# Patient Record
Sex: Female | Born: 1945
Health system: Southern US, Community
[De-identification: ages and names within clinical notes are randomized; demographics above are authoritative.]

## PROBLEM LIST (undated history)

## (undated) DIAGNOSIS — K760 Fatty (change of) liver, not elsewhere classified: Secondary | ICD-10-CM

## (undated) DIAGNOSIS — I10 Essential (primary) hypertension: Secondary | ICD-10-CM

## (undated) DIAGNOSIS — T7840XA Allergy, unspecified, initial encounter: Secondary | ICD-10-CM

## (undated) DIAGNOSIS — E785 Hyperlipidemia, unspecified: Secondary | ICD-10-CM

## (undated) DIAGNOSIS — K7460 Unspecified cirrhosis of liver: Secondary | ICD-10-CM

## (undated) DIAGNOSIS — K449 Diaphragmatic hernia without obstruction or gangrene: Secondary | ICD-10-CM

## (undated) DIAGNOSIS — J45909 Unspecified asthma, uncomplicated: Secondary | ICD-10-CM

## (undated) DIAGNOSIS — D471 Chronic myeloproliferative disease: Secondary | ICD-10-CM

## (undated) DIAGNOSIS — I480 Paroxysmal atrial fibrillation: Secondary | ICD-10-CM

## (undated) DIAGNOSIS — D696 Thrombocytopenia, unspecified: Secondary | ICD-10-CM

## (undated) DIAGNOSIS — I483 Typical atrial flutter: Secondary | ICD-10-CM

## (undated) DIAGNOSIS — I4891 Unspecified atrial fibrillation: Secondary | ICD-10-CM

## (undated) DIAGNOSIS — Z9289 Personal history of other medical treatment: Secondary | ICD-10-CM

## (undated) DIAGNOSIS — R011 Cardiac murmur, unspecified: Secondary | ICD-10-CM

## (undated) DIAGNOSIS — K219 Gastro-esophageal reflux disease without esophagitis: Secondary | ICD-10-CM

## (undated) DIAGNOSIS — N390 Urinary tract infection, site not specified: Secondary | ICD-10-CM

## (undated) DIAGNOSIS — M1990 Unspecified osteoarthritis, unspecified site: Secondary | ICD-10-CM

## (undated) DIAGNOSIS — E7800 Pure hypercholesterolemia, unspecified: Secondary | ICD-10-CM

## (undated) DIAGNOSIS — K2210 Ulcer of esophagus without bleeding: Secondary | ICD-10-CM

## (undated) DIAGNOSIS — Z86010 Personal history of colonic polyps: Secondary | ICD-10-CM

## (undated) HISTORY — DX: Personal history of other medical treatment: Z92.89

## (undated) HISTORY — DX: Hyperlipidemia, unspecified: E78.5

## (undated) HISTORY — PX: APPENDECTOMY: SHX54

## (undated) HISTORY — DX: Unspecified atrial fibrillation: I48.91

## (undated) HISTORY — PX: ABDOMINAL HYSTERECTOMY: SHX81

## (undated) HISTORY — PX: ESOPHAGOGASTRODUODENOSCOPY: SHX1529

## (undated) HISTORY — DX: Fatty (change of) liver, not elsewhere classified: K76.0

## (undated) HISTORY — PX: TONSILLECTOMY: SUR1361

## (undated) HISTORY — PX: UPPER GASTROINTESTINAL ENDOSCOPY: SHX188

## (undated) HISTORY — PX: TOTAL KNEE ARTHROPLASTY: SHX125

## (undated) HISTORY — DX: Diaphragmatic hernia without obstruction or gangrene: K44.9

## (undated) HISTORY — DX: Allergy, unspecified, initial encounter: T78.40XA

## (undated) HISTORY — DX: Thrombocytopenia, unspecified: D69.6

## (undated) HISTORY — DX: Paroxysmal atrial fibrillation: I48.0

## (undated) HISTORY — PX: COLONOSCOPY: SHX174

## (undated) HISTORY — DX: Essential (primary) hypertension: I10

## (undated) HISTORY — DX: Cardiac murmur, unspecified: R01.1

## (undated) HISTORY — DX: Personal history of colonic polyps: Z86.010

## (undated) HISTORY — PX: GYNECOLOGIC CRYOSURGERY: SHX857

## (undated) HISTORY — DX: Unspecified osteoarthritis, unspecified site: M19.90

## (undated) HISTORY — DX: Urinary tract infection, site not specified: N39.0

## (undated) HISTORY — DX: Chronic myeloproliferative disease: D47.1

## (undated) HISTORY — PX: TUBAL LIGATION: SHX77

## (undated) HISTORY — DX: Unspecified asthma, uncomplicated: J45.909

## (undated) HISTORY — DX: Typical atrial flutter: I48.3

## (undated) HISTORY — DX: Unspecified cirrhosis of liver: K74.60

## (undated) HISTORY — DX: Pure hypercholesterolemia, unspecified: E78.00

## (undated) HISTORY — DX: Gastro-esophageal reflux disease without esophagitis: K21.9

## (undated) HISTORY — DX: Ulcer of esophagus without bleeding: K22.10

---

## 1993-03-23 DIAGNOSIS — K2210 Ulcer of esophagus without bleeding: Secondary | ICD-10-CM

## 1993-03-23 HISTORY — DX: Ulcer of esophagus without bleeding: K22.10

## 2014-03-23 HISTORY — PX: REPLACEMENT TOTAL KNEE: SUR1224

## 2015-11-29 ENCOUNTER — Telehealth: Payer: Self-pay | Admitting: Internal Medicine

## 2015-11-29 NOTE — Telephone Encounter (Signed)
Called Alilah back and left a message that we have Dr Algis Greenhouse notes but no Dr Patricia Pesa notes, will have her sign a ROI at office visit.

## 2015-12-02 ENCOUNTER — Other Ambulatory Visit (INDEPENDENT_AMBULATORY_CARE_PROVIDER_SITE_OTHER): Payer: Medicare Other

## 2015-12-02 ENCOUNTER — Encounter: Payer: Self-pay | Admitting: Internal Medicine

## 2015-12-02 ENCOUNTER — Encounter (INDEPENDENT_AMBULATORY_CARE_PROVIDER_SITE_OTHER): Payer: Self-pay

## 2015-12-02 ENCOUNTER — Ambulatory Visit (INDEPENDENT_AMBULATORY_CARE_PROVIDER_SITE_OTHER): Payer: Medicare Other | Admitting: Internal Medicine

## 2015-12-02 VITALS — BP 130/72 | HR 72 | Ht 65.0 in | Wt 201.6 lb

## 2015-12-02 DIAGNOSIS — R161 Splenomegaly, not elsewhere classified: Secondary | ICD-10-CM

## 2015-12-02 DIAGNOSIS — R748 Abnormal levels of other serum enzymes: Secondary | ICD-10-CM

## 2015-12-02 DIAGNOSIS — R740 Nonspecific elevation of levels of transaminase and lactic acid dehydrogenase [LDH]: Secondary | ICD-10-CM

## 2015-12-02 DIAGNOSIS — D696 Thrombocytopenia, unspecified: Secondary | ICD-10-CM

## 2015-12-02 DIAGNOSIS — K219 Gastro-esophageal reflux disease without esophagitis: Secondary | ICD-10-CM

## 2015-12-02 DIAGNOSIS — K760 Fatty (change of) liver, not elsewhere classified: Secondary | ICD-10-CM | POA: Diagnosis not present

## 2015-12-02 DIAGNOSIS — K449 Diaphragmatic hernia without obstruction or gangrene: Secondary | ICD-10-CM

## 2015-12-02 LAB — FERRITIN: Ferritin: 42.1 ng/mL (ref 10.0–291.0)

## 2015-12-02 LAB — CBC WITH DIFFERENTIAL/PLATELET
BASOS PCT: 0.3 % (ref 0.0–3.0)
Basophils Absolute: 0 10*3/uL (ref 0.0–0.1)
EOS ABS: 0.1 10*3/uL (ref 0.0–0.7)
EOS PCT: 1.5 % (ref 0.0–5.0)
HCT: 40.8 % (ref 36.0–46.0)
Hemoglobin: 14.4 g/dL (ref 12.0–15.0)
Lymphocytes Relative: 11.8 % — ABNORMAL LOW (ref 12.0–46.0)
Lymphs Abs: 0.9 10*3/uL (ref 0.7–4.0)
MCHC: 35.1 g/dL (ref 30.0–36.0)
MCV: 87.4 fl (ref 78.0–100.0)
MONO ABS: 0.2 10*3/uL (ref 0.1–1.0)
Monocytes Relative: 2.5 % — ABNORMAL LOW (ref 3.0–12.0)
NEUTROS ABS: 6.7 10*3/uL (ref 1.4–7.7)
Neutrophils Relative %: 83.9 % — ABNORMAL HIGH (ref 43.0–77.0)
PLATELETS: 73 10*3/uL — AB (ref 150.0–400.0)
RBC: 4.67 Mil/uL (ref 3.87–5.11)
RDW: 13.8 % (ref 11.5–15.5)
WBC: 8 10*3/uL (ref 4.0–10.5)

## 2015-12-02 LAB — COMPREHENSIVE METABOLIC PANEL
ALT: 22 U/L (ref 0–35)
AST: 38 U/L — AB (ref 0–37)
Albumin: 4.1 g/dL (ref 3.5–5.2)
Alkaline Phosphatase: 82 U/L (ref 39–117)
BUN: 11 mg/dL (ref 6–23)
CALCIUM: 9.3 mg/dL (ref 8.4–10.5)
CHLORIDE: 101 meq/L (ref 96–112)
CO2: 28 meq/L (ref 19–32)
CREATININE: 0.39 mg/dL — AB (ref 0.40–1.20)
GFR: 173.81 mL/min (ref >60.00)
Glucose, Bld: 82 mg/dL (ref 70–99)
POTASSIUM: 3.8 meq/L (ref 3.5–5.1)
SODIUM: 139 meq/L (ref 135–145)
Total Bilirubin: 1.1 mg/dL (ref 0.2–1.2)
Total Protein: 7.8 g/dL (ref 6.0–8.3)

## 2015-12-02 LAB — HEPATITIS A ANTIBODY, TOTAL: HEP A TOTAL AB: NONREACTIVE

## 2015-12-02 LAB — HEPATITIS B SURFACE ANTIGEN: Hepatitis B Surface Ag: NEGATIVE

## 2015-12-02 LAB — HEPATITIS B SURFACE ANTIBODY,QUALITATIVE: HEP B S AB: POSITIVE — AB

## 2015-12-02 LAB — PROTIME-INR
INR: 1.2 ratio — AB (ref 0.8–1.0)
PROTHROMBIN TIME: 13.1 s (ref 9.6–13.1)

## 2015-12-02 LAB — HEPATITIS C ANTIBODY: HCV Ab: NEGATIVE

## 2015-12-02 NOTE — Progress Notes (Signed)
Savannah Hobbs 70 y.o. 06-Jun-1945 [id number]  Assessment & Plan:   Encounter Diagnoses  Name Primary?  . Fatty liver Yes  . Abnormal transaminases   . Splenomegaly   . Thrombocytopenia ([place])   . Gastroesophageal reflux disease without esophagitis   . Hiatal hernia      CBC, CMET, PT/INR, Ferritin, ANA, Hep C Ab, Hep B surface Antigen, HAV total Ab  Repeat US  I am not sure she truly has cirrhosis. Certainly the splenomegaly and thrombocytopenia raises that possibility. She had a normal size liver with up appropriate hepatopedal flow in October 2016. She could need hepatic elastograophyOr possibly a biopsy that would try the former first.  May need a screening EGD if cirrhosis  Could need another hematology opinion. Her habits the thrombocytopenia is related to hematologic disorder. She did have a negative platelet autoantibody panel but I don't think that would telescope whole story if she had an autoimmune process.  Stay on PPI for GERD. I think some of the symptoms could've been from her large hiatal hernia as well.  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 She had thrombocytopenia discovered last year before knee surgery and was evaluated by hematologist with negative antiplatelet antibody testing. He referred her to a gastroenterologist, Dr. Earley Brooke - ultrasound had showed fatty liver changes and splenomegaly. There was normal portal vein flow. A liver spleen scan was performed which demonstrated shift towards the spleen. She says Dr. Earley Brooke told her she had cirrhosis and to see her in 6 months but not much else.   she had a platelet transfusion prior to her left knee replacement last year and did well with that without bleeding. She has never been a significant  drinker,He reports maybe occasional wine at 2 or 3 times or perhaps for times a week.  He has also seen Dr. Windy Fast for gastroesophageal reflux disease and EGD in 2009 demonstrated a very large hiatal hernia with negative esophageal biopsies, and a screening colonoscopy in 2009 was normal. She also had an EGD in 1992. She had some erosive esophagitis then. I reviewed all those reports independently.    a couple of weeks ago she had some episodes of chest tightness, she saw her cardiologist and EKG and labs were okay she went to twice a day a PPI for a while and that has resolved. She is under significant stress as her 69 year old son died in his sleep recently as well.   GI review of systems is otherwise negative  Allergies  Allergen Reactions  . Iodine Hives  . Shellfish Allergy Hives    Current Outpatient Prescriptions:  .  Ascorbic Acid (VITAMIN C) 1000 MG tablet, Take 1,000 mg by mouth daily., Disp: , Rfl:  .  Cholecalciferol (VITAMIN D3) 2000 units TABS, Take 1 tablet by mouth daily., Disp: , Rfl:  .  docusate sodium (COLACE) 100 MG capsule, Take 100 mg by mouth 2 (two) times daily., Disp: , Rfl:  .  hydrochlorothiazide (HYDRODIURIL) 25 MG tablet, Take 1 tablet by mouth as needed., Disp: , Rfl: 3 .  lansoprazole (PREVACID) 30 MG capsule, Take 15 mg by mouth daily., Disp: , Rfl:  .  Omega-3 Fatty Acids (FISH OIL) 1200 MG CAPS, Take 1 capsule by mouth daily., Disp: , Rfl:  .  simvastatin (  ZOCOR) 20 MG tablet, Take 40 mg by mouth daily., Disp: , Rfl:   Past Medical History:  Diagnosis Date  . Asthma   . Elevated cholesterol   . Erosive esophagitis 1995  . GERD (gastroesophageal reflux disease)   . Hiatal hernia    LARGE  . UTI (lower urinary tract infection)    Past Surgical History:  Procedure Laterality Date  . ABDOMINAL HYSTERECTOMY    . APPENDECTOMY    . COLONOSCOPY    . ESOPHAGOGASTRODUODENOSCOPY    . REPLACEMENT TOTAL KNEE Left 2016  . UPPER GASTROINTESTINAL  ENDOSCOPY     Social History   Social History  . Marital status: Married    Spouse name: N/A  . Number of children: N/A  . Years of education: N/A   Social History Main Topics  . Smoking status: Never Smoker  . Smokeless tobacco: Never Used  . Alcohol use No  . Drug use: No  . Sexual activity: Not Asked   Other Topics Concern  . 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   3 sons, 2015/10/25 1 son died in his sleep unclear cause   Updated 12/02/2015      Family History  Problem Relation Age of Onset  . Esophageal cancer Father    Review of Systems positive for some myalgia symptoms pedal edema especially on the left lower extremity allergy symptoms joint pains. All other review of systems are negative.   Objective:   Physical Exam @BP  130/72 (BP Location: Left Arm, Patient Position: Sitting, Cuff Size: Normal)   Pulse 72   Ht 5\' 5"  (1.651 m)   Wt 201 lb 9.6 oz (91.4 kg)   BMI 33.55 kg/m @  General:  Well-developed, well-nourished and in no acute distress Eyes:  anicteric. ENT:   Mouth and posterior pharynx free of lesions.  Neck:   supple w/o thyromegaly or mass.  Lungs: Clear to auscultation bilaterally. Heart:  S1S2, no rubs, murmurs, gallops. Abdomen:  obese soft, non-tender, no hepatosplenomegaly, hernia, or mass and BS+.  Lymph:  no cervical or supraclavicular adenopathy. Extremities:   no edema, cyanosis or clubbing Skin   no rash. No stigmata CLD Neuro:  A&O x 3.  Psych:  appropriate mood and  Affect.   Data Reviewed: As per history of present illness for ultrasound liver spleen scan. Endoscopic reports. These will be scanned. Platelet count was 62,000 in July 17 with an otherwise normal CBC. Total bilirubin 1.1 and AST 44 then. Other LFTs normal.

## 2015-12-02 NOTE — Patient Instructions (Signed)
Your physician has requested that you go to the basement for the  lab work before leaving today.   Today we are giving you an order to take with you to [facility] to have your complete abdominal ultrasound.  Have them fax the results to : [phone].      Follow up with Dr Carlean Purl in 3 months.       I appreciate the opportunity to care for you. Silvano Rusk, MD, [facility]

## 2015-12-03 LAB — ANA: ANA: NEGATIVE

## 2015-12-06 NOTE — Progress Notes (Signed)
Platelets remain low All other labs ok - no infectious hepatitis changes, autoimmune screen w/ ANA negative and not iron overloaded Await ultrasound which she is doing in [place]

## 2015-12-11 ENCOUNTER — Encounter: Payer: Self-pay | Admitting: Internal Medicine

## 2015-12-20 ENCOUNTER — Telehealth: Payer: Self-pay

## 2015-12-20 NOTE — Telephone Encounter (Signed)
Left message for patient to call back  

## 2015-12-24 NOTE — Telephone Encounter (Signed)
Patient notified of the results and recommendations on [place] report.  She wants to wait until Dec 11 appt to make a decision.

## 2016-03-02 ENCOUNTER — Ambulatory Visit: Payer: Medicare Other | Admitting: Internal Medicine

## 2016-04-17 ENCOUNTER — Encounter: Payer: Self-pay | Admitting: Internal Medicine

## 2016-04-17 ENCOUNTER — Ambulatory Visit (INDEPENDENT_AMBULATORY_CARE_PROVIDER_SITE_OTHER): Payer: Medicare Other | Admitting: Internal Medicine

## 2016-04-17 VITALS — BP 130/72 | HR 78 | Ht 64.0 in | Wt 212.0 lb

## 2016-04-17 DIAGNOSIS — D696 Thrombocytopenia, unspecified: Secondary | ICD-10-CM | POA: Diagnosis not present

## 2016-04-17 DIAGNOSIS — R161 Splenomegaly, not elsewhere classified: Secondary | ICD-10-CM | POA: Diagnosis not present

## 2016-04-17 DIAGNOSIS — K760 Fatty (change of) liver, not elsewhere classified: Secondary | ICD-10-CM | POA: Diagnosis not present

## 2016-04-17 NOTE — Patient Instructions (Signed)
  You have been scheduled for an abdominal ultrasound at [facility] Radiology (1st floor of hospital) on 04/22/16 at 11:00AM. Please arrive 15 minutes prior to your appointment for registration. Make certain not to have anything to eat or drink 6 hours prior to your appointment. Should you need to reschedule your appointment, please contact radiology at [phone]. This test typically takes about 30 minutes to perform.    I appreciate the opportunity to care for you. Silvano Rusk, MD, [facility]

## 2016-04-17 NOTE — Progress Notes (Signed)
   Savannah Hobbs 71 y.o. Sep 01, 1945 [id number]  Assessment & Plan:   Encounter Diagnoses  Name Primary?  . Fatty liver disease, nonalcoholic Yes  . Thrombocytopenia ([place])   . Splenomegaly    ? If she has cirrhosis -seems possible but not clear Will have her do hepatic elastography   Subjective:   Chief Complaint: fatty liver, ? cirrhosis  HPI Here for f/u w/ husband - has thrombocytopenia and fatty liver and splenomegaly. Liver/spleen scan w/ flow to spleen> liver Liver NL on [place] excepy fatty Has second TKR coming up in [place] negative for infection, autoimmune, ferritin ok  Medications, allergies, past medical history, past surgical history, family history and social history are reviewed and updated in the EMR.  Review of Systems As above  Objective:   Physical Exam BP 130/72 (BP Location: Left Arm, Patient Position: Sitting, Cuff Size: Normal)   Pulse 78   Ht 5\' 4"  (1.626 m) Comment: height measured without shoes  Wt 212 lb (96.2 kg)   BMI 36.39 kg/m  NAD

## 2016-04-21 ENCOUNTER — Telehealth: Payer: Self-pay | Admitting: Internal Medicine

## 2016-04-21 MED ORDER — LANSOPRAZOLE 15 MG PO CPDR: 15.0000 mg | DELAYED_RELEASE_CAPSULE | Freq: Every day | ORAL | 3 refills | Status: DC

## 2016-04-21 NOTE — Telephone Encounter (Signed)
Patient seen yesterday, lansoprazole sent in as requested.

## 2016-04-22 ENCOUNTER — Encounter (HOSPITAL_COMMUNITY)
Admission: RE | Admit: 2016-04-22 | Discharge: 2016-04-22 | Disposition: A | Discharge status: Home / Self Care | Payer: Medicare Other | Source: Ambulatory Visit | Attending: Internal Medicine | Admitting: Internal Medicine

## 2016-04-22 ENCOUNTER — Telehealth: Payer: Self-pay

## 2016-04-22 DIAGNOSIS — K760 Fatty (change of) liver, not elsewhere classified: Secondary | ICD-10-CM | POA: Insufficient documentation

## 2016-04-22 MED ORDER — PANTOPRAZOLE SODIUM 20 MG PO TBEC: 20.0000 mg | DELAYED_RELEASE_TABLET | Freq: Every day | ORAL | 3 refills | Status: DC

## 2016-04-22 NOTE — Telephone Encounter (Signed)
Pantoprazole 20 mg daily 1 year supply 30 vs 90

## 2016-04-22 NOTE — Telephone Encounter (Signed)
Sig please Sir, thank you. 

## 2016-04-22 NOTE — Telephone Encounter (Signed)
OK by me 

## 2016-04-22 NOTE — Telephone Encounter (Signed)
Pharmacy sent a request to change her lansoprazole to pantoprazole due to her insurance, please advise Sir, thank you.

## 2016-04-22 NOTE — Telephone Encounter (Signed)
  Sent in the pantoprazole to replace the lansoprazole because of insurance formulary.

## 2016-04-23 NOTE — Progress Notes (Signed)
This test is consistent with cirrhosis - good news is she does not seem to have any significant complications at this point. So I do not think further evaluation of low platelets is needed.  I do recommend an EGD at some point to look for esophageal varices - can do before or after her knee surgery that is upcoming  We [place] arrange other f/u after that  Fatty liver is cause

## 2016-06-03 ENCOUNTER — Telehealth: Payer: Self-pay | Admitting: Internal Medicine

## 2016-06-03 NOTE — Telephone Encounter (Signed)
Her surgeon needs clearance from you for knee surgery.  Ok to proceed?

## 2016-06-03 NOTE — Telephone Encounter (Signed)
pt states that she is suppose to have total knee replacment on 07/06/16 and needs Dr.Gessner's clearance due to her low platelets .

## 2016-06-03 NOTE — Telephone Encounter (Signed)
Patient notified of the recommendation. Her surgeon is Dr. Reynaldo Minium.  She asked that I fax this to [phone] to attention Tammy

## 2016-06-03 NOTE — Telephone Encounter (Signed)
I believe it is but would check a protime/INR and CBC before surgery  It would be up to him if he wants to give platelets pre-op or he could ask a hematologist  Probably safer to administer 10 U Platelets in advance to reduce bleeding risk

## 2018-04-13 ENCOUNTER — Encounter: Payer: Self-pay | Admitting: Internal Medicine

## 2018-04-18 ENCOUNTER — Ambulatory Visit (AMBULATORY_SURGERY_CENTER): Payer: Self-pay | Admitting: *Deleted

## 2018-04-18 VITALS — Ht 65.0 in | Wt 217.0 lb

## 2018-04-18 DIAGNOSIS — Z1211 Encounter for screening for malignant neoplasm of colon: Secondary | ICD-10-CM

## 2018-04-18 NOTE — Progress Notes (Signed)
No egg or soy allergy known to patient  No issues with past sedation with any surgeries  or procedures, no intubation problems  No diet pills per patient No home 02 use per patient  No blood thinners per patient  Pt denies issues with constipation - uses colace daily 2 x a day  No A fib or A flutter  EMMI video sent to pt's e mail - pt declined

## 2018-05-06 ENCOUNTER — Encounter: Payer: Medicare Other | Admitting: Internal Medicine

## 2018-05-24 ENCOUNTER — Encounter: Payer: Self-pay | Admitting: Internal Medicine

## 2018-05-24 ENCOUNTER — Other Ambulatory Visit: Payer: Self-pay

## 2018-05-24 ENCOUNTER — Ambulatory Visit (AMBULATORY_SURGERY_CENTER): Payer: Medicare Other | Admitting: Internal Medicine

## 2018-05-24 VITALS — BP 146/66 | HR 83 | Temp 98.6°F | Resp 15 | Ht 65.0 in | Wt 217.0 lb

## 2018-05-24 DIAGNOSIS — Z1211 Encounter for screening for malignant neoplasm of colon: Secondary | ICD-10-CM

## 2018-05-24 DIAGNOSIS — D125 Benign neoplasm of sigmoid colon: Secondary | ICD-10-CM | POA: Diagnosis not present

## 2018-05-24 DIAGNOSIS — D123 Benign neoplasm of transverse colon: Secondary | ICD-10-CM

## 2018-05-24 DIAGNOSIS — K635 Polyp of colon: Secondary | ICD-10-CM

## 2018-05-24 DIAGNOSIS — D120 Benign neoplasm of cecum: Secondary | ICD-10-CM

## 2018-05-24 DIAGNOSIS — D124 Benign neoplasm of descending colon: Secondary | ICD-10-CM

## 2018-05-24 MED ORDER — SODIUM CHLORIDE 0.9 % IV SOLN: 500.0000 mL | Freq: Once | INTRAVENOUS | Status: DC

## 2018-05-24 NOTE — Progress Notes (Signed)
Spontaneous respirations throughout. VSS. Resting comfortably. To PACU on room air. Report to  RN. 

## 2018-05-24 NOTE — Patient Instructions (Addendum)
I found and removed 6 small polyps. I will let you know pathology results and when to have another routine colonoscopy by mail and/or My Chart.  When we last had contact in 2018 I had intended for you to have an upper endoscopy but you were having knee surgery and were going to call back. Looks like that did not happen.  I suggest you make an appointment to see me so we can follow-up about the fatty liver disease.  I appreciate the opportunity to care for you. Gatha Mayer, MD, [facility]   Handouts Provided:  Polyps  YOU HAD AN ENDOSCOPIC PROCEDURE TODAY AT [place]:   Refer to the procedure report that was given to you for any specific questions about what was found during the examination.  If the procedure report does not answer your questions, please call your gastroenterologist to clarify.  If you requested that your care partner not be given the details of your procedure findings, then the procedure report has been included in a sealed envelope for you to review at your convenience later.  YOU SHOULD EXPECT: Some feelings of bloating in the abdomen. Passage of more gas than usual.  Walking can help get rid of the air that was put into your GI tract during the procedure and reduce the bloating. If you had a lower endoscopy (such as a colonoscopy or flexible sigmoidoscopy) you may notice spotting of blood in your stool or on the toilet paper. If you underwent a bowel prep for your procedure, you may not have a normal bowel movement for a few days.  Please Note:  You might notice some irritation and congestion in your nose or some drainage.  This is from the oxygen used during your procedure.  There is no need for concern and it should clear up in a day or so.  SYMPTOMS TO REPORT IMMEDIATELY:   Following lower endoscopy (colonoscopy or flexible sigmoidoscopy):  Excessive amounts of blood in the stool  Significant tenderness or worsening of abdominal  pains  Swelling of the abdomen that is new, acute  Fever of 100F or higher  For urgent or emergent issues, a gastroenterologist can be reached at any hour by calling [phone].   DIET:  We do recommend a small meal at first, but then you may proceed to your regular diet.  Drink plenty of fluids but you should avoid alcoholic beverages for 24 hours.  ACTIVITY:  You should plan to take it easy for the rest of today and you should NOT DRIVE or use heavy machinery until tomorrow (because of the sedation medicines used during the test).    FOLLOW UP: Our staff will call the number listed on your records the next business day following your procedure to check on you and address any questions or concerns that you may have regarding the information given to you following your procedure. If we do not reach you, we will leave a message.  However, if you are feeling well and you are not experiencing any problems, there is no need to return our call.  We will assume that you have returned to your regular daily activities without incident.  If any biopsies were taken you will be contacted by phone or by letter within the next 1-3 weeks.  Please call us at [phone] if you have not heard about the biopsies in 3 weeks.    SIGNATURES/CONFIDENTIALITY: You and/or your care partner have signed paperwork which will  be entered into your electronic medical record.  These signatures attest to the fact that that the information above on your After Visit Summary has been reviewed and is understood.  Full responsibility of the confidentiality of this discharge information lies with you and/or your care-partner.

## 2018-05-24 NOTE — Progress Notes (Signed)
Called to room to assist during endoscopic procedure.  Patient ID and intended procedure confirmed with present staff. Received instructions for my participation in the procedure from the performing physician.  

## 2018-05-25 ENCOUNTER — Telehealth: Payer: Self-pay

## 2018-05-25 NOTE — Telephone Encounter (Signed)
  Follow up Call-  Call back number 05/24/2018  Post procedure Call Back phone  # [phone]  Permission to leave phone message Yes  Some recent data might be hidden     Patient questions:  Do you have a fever, pain , or abdominal swelling? No. Pain Score  0 *  Have you tolerated food without any problems? Yes.    Have you been able to return to your normal activities? Yes.    Do you have any questions about your discharge instructions: Diet   No. Medications  No. Follow up visit  No.  Do you have questions or concerns about your Care? No.  Actions: * If pain score is 4 or above: No action needed, pain <4.   No problems noted per pt. maw

## 2018-05-25 NOTE — Op Note (Signed)
[facility] Patient Name: Savannah Hobbs Procedure Date: 05/24/2018 2:10 PM MRN: [id number] Endoscopist: Gatha Mayer , MD Age: 73 Referring MD:  Date of Birth: 04-May-1945 Gender: Female Account #: [id number] Procedure:                Colonoscopy Indications:              Screening for colorectal malignant neoplasm Medicines:                Propofol per Anesthesia, Monitored Anesthesia Care Procedure:                Pre-Anesthesia Assessment:                           - Prior to the procedure, a History and Physical                            was performed, and patient medications and                            allergies were reviewed. The patient's tolerance of                            previous anesthesia was also reviewed. The risks                            and benefits of the procedure and the sedation                            options and risks were discussed with the patient.                            All questions were answered, and informed consent                            was obtained. Prior Anticoagulants: The patient has                            taken no previous anticoagulant or antiplatelet                            agents. ASA Grade Assessment: III - A patient with                            severe systemic disease. After reviewing the risks                            and benefits, the patient was deemed in                            satisfactory condition to undergo the procedure.                           After obtaining informed consent, the colonoscope  was passed under direct vision. Throughout the                            procedure, the patient's blood pressure, pulse, and                            oxygen saturations were monitored continuously. The                            Colonoscope was introduced through the anus and                            advanced to the the cecum, identified by   appendiceal orifice and ileocecal valve. The                            colonoscopy was performed without difficulty. The                            patient tolerated the procedure well. The quality                            of the bowel preparation was good. The bowel                            preparation used was Miralax. The ileocecal valve,                            appendiceal orifice, and rectum were photographed. Scope In: 2:38:43 PM Scope Out: 3:01:21 PM Scope Withdrawal Time: 0 hours 18 minutes 30 seconds  Total Procedure Duration: 0 hours 22 minutes 38 seconds  Findings:                 The perianal and digital rectal examinations were                            normal.                           Six sessile polyps were found in the sigmoid colon,                            descending colon, transverse colon and cecum. The                            polyps were 2 to 6 mm in size. These polyps were                            removed with a cold snare. Resection and retrieval                            were complete. Verification of patient  identification for the specimen was done. Estimated                            blood loss was minimal.                           A patchy area of altered vascular mucosa was found                            in the transverse colon, in the ascending colon and                            in the cecum. Telangiectatic changes scattered                            about.                           The exam was otherwise without abnormality on                            direct and retroflexion views. Complications:            No immediate complications. Estimated Blood Loss:     Estimated blood loss was minimal. Impression:               - Six 2 to 6 mm polyps in the sigmoid colon, in the                            descending colon, in the transverse colon and in                            the cecum, removed with a cold  snare. Resected and                            retrieved.                           - Altered vascular mucosa in the transverse colon,                            in the ascending colon and in the cecum.                            Telengiectatic changes. ? if this could be portal                            colopathy.                           - The examination was otherwise normal on direct                            and retroflexion views. Recommendation:           - Patient has a  contact number available for                            emergencies. The signs and symptoms of potential                            delayed complications were discussed with the                            patient. Return to normal activities tomorrow.                            Written discharge instructions were provided to the                            patient.                           - Resume previous diet.                           - Continue present medications.                           - Repeat colonoscopy is recommended. The                            colonoscopy date will be determined after pathology                            results from today's exam become available for                            review.                           - Return to my office at the next available                            appointment. She is to call re: Fatty Liver disease Gatha Mayer, MD 05/24/2018 3:11:57 PM This report has been signed electronically.

## 2018-05-27 ENCOUNTER — Encounter: Payer: Self-pay | Admitting: Internal Medicine

## 2018-05-27 DIAGNOSIS — Z860100 Personal history of colon polyps, unspecified: Secondary | ICD-10-CM | POA: Insufficient documentation

## 2018-05-27 HISTORY — DX: Personal history of colon polyps, unspecified: Z86.0100

## 2018-05-27 HISTORY — DX: Personal history of colonic polyps: Z86.010

## 2018-05-27 NOTE — Progress Notes (Signed)
Mix of ssp and distal hyperplastic Max size 6 mm Recall [phone]

## 2018-06-27 ENCOUNTER — Ambulatory Visit: Payer: Medicare Other | Admitting: Internal Medicine

## 2020-01-22 ENCOUNTER — Emergency Department (HOSPITAL_COMMUNITY): Payer: Medicare Other

## 2020-01-22 ENCOUNTER — Encounter (HOSPITAL_COMMUNITY): Payer: Self-pay

## 2020-01-22 ENCOUNTER — Other Ambulatory Visit: Payer: Self-pay

## 2020-01-22 ENCOUNTER — Inpatient Hospital Stay (HOSPITAL_COMMUNITY)
Admission: EM | Admit: 2020-01-22 | Discharge: 2020-01-24 | DRG: 310 | Disposition: A | Discharge status: Home / Self Care | Payer: Medicare Other | Attending: Student in an Organized Health Care Education/Training Program | Admitting: Student in an Organized Health Care Education/Training Program

## 2020-01-22 DIAGNOSIS — K7460 Unspecified cirrhosis of liver: Secondary | ICD-10-CM | POA: Diagnosis present

## 2020-01-22 DIAGNOSIS — Z8249 Family history of ischemic heart disease and other diseases of the circulatory system: Secondary | ICD-10-CM

## 2020-01-22 DIAGNOSIS — M1990 Unspecified osteoarthritis, unspecified site: Secondary | ICD-10-CM | POA: Diagnosis present

## 2020-01-22 DIAGNOSIS — I4892 Unspecified atrial flutter: Secondary | ICD-10-CM | POA: Diagnosis not present

## 2020-01-22 DIAGNOSIS — I480 Paroxysmal atrial fibrillation: Secondary | ICD-10-CM | POA: Diagnosis present

## 2020-01-22 DIAGNOSIS — I2510 Atherosclerotic heart disease of native coronary artery without angina pectoris: Secondary | ICD-10-CM | POA: Diagnosis present

## 2020-01-22 DIAGNOSIS — I700 Atherosclerosis of aorta: Secondary | ICD-10-CM | POA: Diagnosis present

## 2020-01-22 DIAGNOSIS — Z90711 Acquired absence of uterus with remaining cervical stump: Secondary | ICD-10-CM

## 2020-01-22 DIAGNOSIS — E669 Obesity, unspecified: Secondary | ICD-10-CM | POA: Diagnosis present

## 2020-01-22 DIAGNOSIS — I4891 Unspecified atrial fibrillation: Secondary | ICD-10-CM

## 2020-01-22 DIAGNOSIS — Z23 Encounter for immunization: Secondary | ICD-10-CM

## 2020-01-22 DIAGNOSIS — D6959 Other secondary thrombocytopenia: Secondary | ICD-10-CM | POA: Diagnosis present

## 2020-01-22 DIAGNOSIS — Z888 Allergy status to other drugs, medicaments and biological substances status: Secondary | ICD-10-CM

## 2020-01-22 DIAGNOSIS — Z20822 Contact with and (suspected) exposure to covid-19: Secondary | ICD-10-CM | POA: Diagnosis present

## 2020-01-22 DIAGNOSIS — K30 Functional dyspepsia: Secondary | ICD-10-CM | POA: Diagnosis present

## 2020-01-22 DIAGNOSIS — Z91013 Allergy to seafood: Secondary | ICD-10-CM

## 2020-01-22 DIAGNOSIS — I10 Essential (primary) hypertension: Secondary | ICD-10-CM | POA: Diagnosis present

## 2020-01-22 DIAGNOSIS — K219 Gastro-esophageal reflux disease without esophagitis: Secondary | ICD-10-CM | POA: Diagnosis present

## 2020-01-22 DIAGNOSIS — J45909 Unspecified asthma, uncomplicated: Secondary | ICD-10-CM | POA: Diagnosis present

## 2020-01-22 DIAGNOSIS — Z87440 Personal history of urinary (tract) infections: Secondary | ICD-10-CM

## 2020-01-22 DIAGNOSIS — E785 Hyperlipidemia, unspecified: Secondary | ICD-10-CM | POA: Diagnosis present

## 2020-01-22 DIAGNOSIS — Z79899 Other long term (current) drug therapy: Secondary | ICD-10-CM

## 2020-01-22 DIAGNOSIS — Z8616 Personal history of COVID-19: Secondary | ICD-10-CM

## 2020-01-22 LAB — CBC
HCT: 37.8 % (ref 36.0–46.0)
Hemoglobin: 11.6 g/dL — ABNORMAL LOW (ref 12.0–15.0)
MCH: 26.5 pg (ref 26.0–34.0)
MCHC: 30.7 g/dL (ref 30.0–36.0)
MCV: 86.3 fL (ref 80.0–100.0)
Platelets: 190 10*3/uL (ref 150–400)
RBC: 4.38 MIL/uL (ref 3.87–5.11)
RDW: 21.7 % — ABNORMAL HIGH (ref 11.5–15.5)
WBC: 25.9 10*3/uL — ABNORMAL HIGH (ref 4.0–10.5)
nRBC: 0.1 % (ref 0.0–0.2)

## 2020-01-22 LAB — URINALYSIS, COMPLETE (UACMP) WITH MICROSCOPIC
Bacteria, UA: NONE SEEN
Bilirubin Urine: NEGATIVE
Glucose, UA: NEGATIVE mg/dL
Hgb urine dipstick: NEGATIVE
Ketones, ur: NEGATIVE mg/dL
Leukocytes,Ua: NEGATIVE
Nitrite: NEGATIVE
Protein, ur: NEGATIVE mg/dL
Specific Gravity, Urine: 1.013 (ref 1.005–1.030)
pH: 7 (ref 5.0–8.0)

## 2020-01-22 LAB — HEPATIC FUNCTION PANEL
ALT: 18 U/L (ref 0–44)
AST: 34 U/L (ref 15–41)
Albumin: 3.5 g/dL (ref 3.5–5.0)
Alkaline Phosphatase: 76 U/L (ref 38–126)
Bilirubin, Direct: 0.4 mg/dL — ABNORMAL HIGH (ref 0.0–0.2)
Indirect Bilirubin: 1.2 mg/dL — ABNORMAL HIGH (ref 0.3–0.9)
Total Bilirubin: 1.6 mg/dL — ABNORMAL HIGH (ref 0.3–1.2)
Total Protein: 7.8 g/dL (ref 6.5–8.1)

## 2020-01-22 LAB — BASIC METABOLIC PANEL
Anion gap: 10 (ref 5–15)
BUN: 13 mg/dL (ref 8–23)
CO2: 26 mmol/L (ref 22–32)
Calcium: 8.8 mg/dL — ABNORMAL LOW (ref 8.9–10.3)
Chloride: 103 mmol/L (ref 98–111)
Creatinine, Ser: 0.45 mg/dL (ref 0.44–1.00)
GFR, Estimated: >60 mL/min (ref >60)
Glucose, Bld: 122 mg/dL — ABNORMAL HIGH (ref 70–99)
Potassium: 3.8 mmol/L (ref 3.5–5.1)
Sodium: 139 mmol/L (ref 135–145)

## 2020-01-22 LAB — TROPONIN I (HIGH SENSITIVITY)
Troponin I (High Sensitivity): 8 ng/L (ref <18)
Troponin I (High Sensitivity): 7 ng/L (ref <18)

## 2020-01-22 LAB — PROTIME-INR
INR: 1.3 — ABNORMAL HIGH (ref 0.8–1.2)
Prothrombin Time: 15.6 seconds — ABNORMAL HIGH (ref 11.4–15.2)

## 2020-01-22 LAB — TSH: TSH: 1.912 u[IU]/mL (ref 0.350–4.500)

## 2020-01-22 LAB — BRAIN NATRIURETIC PEPTIDE: B Natriuretic Peptide: 479.7 pg/mL — ABNORMAL HIGH (ref 0.0–100.0)

## 2020-01-22 LAB — RESPIRATORY PANEL BY RT PCR (FLU A&B, COVID)
Influenza A by PCR: NEGATIVE
Influenza B by PCR: NEGATIVE
SARS Coronavirus 2 by RT PCR: NEGATIVE

## 2020-01-22 LAB — MAGNESIUM: Magnesium: 1.6 mg/dL — ABNORMAL LOW (ref 1.7–2.4)

## 2020-01-22 LAB — D-DIMER, QUANTITATIVE: D-Dimer, Quant: 2.01 ug/mL-FEU — ABNORMAL HIGH (ref 0.00–0.50)

## 2020-01-22 MED ORDER — DILTIAZEM HCL-DEXTROSE 125-5 MG/125ML-% IV SOLN (PREMIX)
5.0000 mg/h | INTRAVENOUS | Status: DC
  Administered 2020-01-22: 7.5 mg/h via INTRAVENOUS
  Filled 2020-01-22 (×2): qty 125

## 2020-01-22 MED ORDER — DILTIAZEM HCL 25 MG/5ML IV SOLN
20.0000 mg | Freq: Once | INTRAVENOUS | Status: AC
  Administered 2020-01-22: 20 mg via INTRAVENOUS
  Filled 2020-01-22: qty 5

## 2020-01-22 MED ORDER — IOHEXOL 350 MG/ML SOLN
100.0000 mL | Freq: Once | INTRAVENOUS | Status: AC | PRN
  Administered 2020-01-22: 75 mL via INTRAVENOUS

## 2020-01-22 MED ORDER — SIMVASTATIN 20 MG PO TABS
20.0000 mg | ORAL_TABLET | Freq: Every day | ORAL | Status: DC
  Administered 2020-01-22 – 2020-01-24 (×3): 20 mg via ORAL
  Filled 2020-01-22 (×3): qty 1

## 2020-01-22 MED ORDER — PANTOPRAZOLE SODIUM 40 MG PO TBEC
40.0000 mg | DELAYED_RELEASE_TABLET | Freq: Every day | ORAL | Status: DC
  Administered 2020-01-22 – 2020-01-24 (×3): 40 mg via ORAL
  Filled 2020-01-22 (×3): qty 1

## 2020-01-22 MED ORDER — ENOXAPARIN SODIUM 40 MG/0.4ML ~~LOC~~ SOLN
40.0000 mg | SUBCUTANEOUS | Status: DC
  Filled 2020-01-22: qty 0.4

## 2020-01-22 MED ORDER — ACETAMINOPHEN 325 MG PO TABS: 650.0000 mg | ORAL_TABLET | ORAL | Status: DC | PRN

## 2020-01-22 MED ORDER — ONDANSETRON HCL 4 MG/2ML IJ SOLN: 4.0000 mg | Freq: Four times a day (QID) | INTRAMUSCULAR | Status: DC | PRN

## 2020-01-22 MED ORDER — DILTIAZEM HCL-DEXTROSE 125-5 MG/125ML-% IV SOLN (PREMIX)
5.0000 mg/h | INTRAVENOUS | Status: DC
  Administered 2020-01-22: 5 mg/h via INTRAVENOUS
  Filled 2020-01-22: qty 125

## 2020-01-22 NOTE — H&P (Addendum)
Date: 01/22/2020               Patient Name:  Savannah Hobbs MRN: [id number]  DOB: 1945/11/22 Age / Sex: 74 y.o., female   PCP: Pcp, No         Medical Service: Internal Medicine Teaching Service         Attending Physician: Dr. Evette Doffing, Mallie Mussel, *    First Contact: Dr. Sanjuan Dame Pager: [phone]  Second Contact: Dr. Harvie Heck Pager: [phone]       After Hours (After 5p/  First Contact Pager: [phone]  weekends / holidays): Second Contact Pager: [phone]   Chief Complaint: Palpitations and shortness of breath  History of Present Illness: [name] is a 74 y.o.  female with PMH of  cirrhosis, hyperlipidemia, arthritis, GERD and paroxysmal A. fib who presents [facility] with complaints of palpitations and shortness of breath. Patient states she was diagnosed with Afib in October 2020 and was started on metoprolol 25 mg twice daily. The metoprolol made her feel bad so she stopped taking it daily and was told to take it when she had episodes of Afib. He last episode was in January after she took the first dose of the Moderna vaccine. This current episode started Saturday morning when she woke with palpitations, shortness of breath and chest discomfort. She checked her HR and it was 178. She continued to have symptoms throughout the day so she took her metoprolol and her HR dropped to the 130s-140s. She continued to take it throughout the weekend but her HR remained in the 130 range and her symptoms did not completely resolve. She endorsed associated weakness and chronic LE swelling but denies any dizziness, headache, abd pain, N/V, blurry vision, hemoptysis, bloody stool, fevers or chills. Patient also states she has had chronic leg swelling since she had her knee repl  Of note: Patient states her cirrhosis was caused by her taking triple dose of Osteo Bi-flex for her arthritis. States she was not placed on anticoagulation due to her thrombocytopenia. She follows with Dr. Wynelle Link,  (heme/onc) yearly for monitoring.  Meds:  Current Meds  Medication Sig   Ascorbic Acid (VITAMIN C) 1000 MG tablet Take 1,000 mg by mouth daily.   cetirizine (ZYRTEC) 10 MG tablet Take 10 mg by mouth in the morning.   Cholecalciferol (VITAMIN D-3) 25 MCG (1000 UT) CAPS Take 1,000 Units by mouth daily.   docusate sodium (COLACE) 100 MG capsule Take 100 mg by mouth in the morning.    esomeprazole (NEXIUM 24HR) 20 MG capsule Take 20 mg by mouth daily before breakfast.   hydrochlorothiazide (HYDRODIURIL) 25 MG tablet Take 25 mg by mouth daily.    ibuprofen (ADVIL) 200 MG tablet Take 200 mg by mouth every 6 (six) hours as needed for mild pain (or headaches).   simvastatin (ZOCOR) 40 MG tablet Take 40 mg by mouth at bedtime.    Allergies: Allergies as of 01/22/2020 - Review Complete 01/22/2020  Allergen Reaction Noted   Iodine Hives 05/10/2010   Shellfish allergy Hives 12/02/2015   Past Medical History:  Diagnosis Date   Allergy    Arthritis    Asthma    Cirrhosis of liver ([place])    due to medications for arthritis-    Elevated cholesterol    Erosive esophagitis 1995   Fatty liver    GERD (gastroesophageal reflux disease)    Heart murmur    Hiatal hernia    LARGE  Hx of colonic polyps 05/27/2018   Hx of transfusion of platelets    Thrombocytopenia (HCC)    UTI (lower urinary tract infection)     Family History: Significant for father who had esophageal tumor and pulmonary embolism. Mother had history of angina. No other significant family history  Social History: Patient lives with husband and they have been married for 13 years.  She has 4 older sons.  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  States she used to drink EtOH socially before she was diagnosed with cirrhosis but has had limited amounts of wine since then.  She denies any history of smoking or illicit drug use.  Review of Systems: A complete ROS was negative except as per HPI.   Physical  Exam: Blood pressure 110/78, pulse (!) 134, temperature 98 F (36.7 C), temperature source Oral, resp. rate 15, height 5\' 5"  (1.651 m), weight 93.4 kg, SpO2 96 %.  General: Pleasant, well-appearing middle-age woman laying in bed. No acute distress. HEENT: No Scleral icterus. Susank/AT. CV: Tachycardic. Irregular rhythm. No murmurs, rubs, or gallops. Trace BLE edema Pulmonary: Lungs CTAB. Normal effort. No wheezing or rales. Abdominal: Soft, nontender, nondistended. No stigmata of cirrhosis. Normal bowel sounds.  Extremities: Palpable pulses. Normal ROM. Skin: Warm and dry. No obvious rash or lesions. Neuro: A&Ox3. Moves all extremities. Normal sensation. No focal deficit. Psych: Normal mood and affect  EKG: personally reviewed my interpretation is aflutter with variable AV block  CXR: personally reviewed my interpretation is no acute cardiopulmonary disease  Assessment & Plan by Problem:  Principal Problem:   Atrial flutter with rapid ventricular response ([place]) Active Problems:   Obesity   Hepatic cirrhosis ([place])  [name] is a 74 y.o.  female with PMH of cirrhosis, hyperlipidemia, arthritis, GERD and paroxysmal A. fib who presents [facility] with complaints of palpitations and SOB and found to be in Aflutter w/ RVR.   #Aflutter with RVR CHADS-VASc score of 2. Patient with hx of paroxysmal afib on rate control with metoprolol here w/ SOB and palpitation and found to be in aflutter on admission. Patient not currently on any anticoagulation due to thrombocytopenia. Mildly elevated D-dimer but CT neg for PE. S/p 1 dose of Cardizem 20 mg injection in the ED. Heart rate improved after starting Cardizem drip and currently in the 120s. Exam significant for tachycardia, irregular rhythm and trace BLE edema. BNP elevated to 400. No ECHO on file to evaluate heart function. Hemodynamically stable at the moment. Will give prophylactic anticoagulation and monitor.  --Continue Cardizem drip w/ target  HR 65-105 --Tele --ECHO --Daily vitals --Repeat EKG --Daily vitals  #Cirrhosis #Thrombocytopenia U/S abd w/ elastography in 03/2016 was significant for hepatic cirrhosis and splenomegaly. Patient reports this was due to her arthritis medications. No recent EGD to assess for variceal bleeds. CT angio chest on admission shows changes of cirrhosis of the liver with splenomegaly and portal hypertension. Mildly elevated PT-INR. Platelet improved from 73 four years ago to 190 on admission. Asymptomatic and stable.  --F/u morning CBC --F/u repeat PT-INR --Consider outpatient EGD after d/c  #Leukocytosis Patient was found to have a white count of 25.9 on admission. Denies any fever or chills. Negative covid test. UA and CXR unremarkable. Continues to be afebrile. Per pt, white count was significant elevated to almost 50 when she was admitted for covid in January in [place]. Likely a reactive process but will continue to monitor.  --F/u morning  CBC --Daily vitals.    #GERD Chronic and stable. On Nexium 20 mg daily at home.  --Protonix 40 mg daily  #HLD No lipid panel on file.  --Follow up lipid panel --Continue simvastatin 20 mg daily.   CODE STATUS: Full code DIET: Regular diet PPx: Lovenox 40 mg subcu daily  Dispo: Admit patient to Observation with expected length of stay less than 2 midnights.  Signed: Lacinda Axon, MD 01/22/2020, 10:05 PM  Pager: [phone] Internal Medicine Teaching Service After 5pm on weekdays and 1pm on weekends: On Call pager: [phone]

## 2020-01-22 NOTE — Discharge Instructions (Addendum)
Atrial Flutter  Atrial flutter is a type of abnormal heart rhythm (arrhythmia). The heart has an electrical system that tells it how to beat. In atrial flutter, the signals move rapidly in the top chambers of the heart (the atria). This makes your heart beat very fast. Atrial flutter can come and go, or it can be permanent. The goal of treatment is to prevent blood clots from forming, control your heart rate, or restore your heartbeat to a normal rhythm. If this condition is not treated, it can cause serious problems, such as a weakened heart muscle (cardiomyopathy) or a stroke. What are the causes? This condition is often caused by conditions that damage the heart's electrical system. These include:  Heart conditions and heart surgery. These include heart attacks and open-heart surgery.  Lung problems, such as COPD or a blood clot in the lung (pulmonary embolism, or PE).  Poorly controlled high blood pressure (hypertension).  Overactive thyroid (hyperthyroidism).  Diabetes. In some cases, the cause of this condition is not known. What increases the risk? You are more likely to develop this condition if:  You are an elderly adult.  You are a man.  You are overweight (obese).  You have obstructive sleep apnea.  You have a family history of atrial flutter.  You have diabetes.  You drink a lot of alcohol, especially binge drinking.  You use drugs, including cannabis.  You smoke. What are the signs or symptoms? Symptoms of this condition include:  A feeling that your heart is pounding or racing (palpitations).  Shortness of breath.  Chest pain.  Feeling dizzy or light-headed.  Fainting.  Low blood pressure (hypotension).  Fatigue.  Tiring easily during exercise or activity. In some cases, there are no symptoms. How is this diagnosed? This condition may be diagnosed with:  An electrocardiogram (ECG) to check electrical signals of the heart.  An ambulatory  cardiac monitor to record your heart's activity for a few days.  An echocardiogram to create pictures of your heart.  A transesophageal echocardiogram (TEE) to create even better pictures of your heart.  A stress test to check your blood supply while you exercise.  Imaging tests, such as a CT scan or chest X-ray.  Blood tests. How is this treated? Treatment depends on underlying conditions and how you feel when you experience atrial flutter. This condition may be treated with:  Medicines to prevent blood clots or to treat heart rate or heart rhythm problems.  Electrical cardioversion to reset the heart's rhythm.  Ablation to remove the heart tissue that sends abnormal signals.  Left atrial appendage closure to seal the area where blood clots can form. In some cases, underlying conditions will be treated. Follow these instructions at home: Medicines  Take over-the-counter and prescription medicines only as told by your health care provider.  Do not take any new medicines without talking to your health care provider.  If you are taking blood thinners: ? Talk with your health care provider before you take any medicines that contain aspirin or NSAIDs, such as ibuprofen. These medicines increase your risk for dangerous bleeding. ? Take your medicine exactly as told, at the same time every day. ? Avoid activities that could cause injury or bruising, and follow instructions about how to prevent falls. ? Wear a medical alert bracelet or carry a card that lists what medicines you take. Lifestyle  Eat heart-healthy foods. Talk with a dietitian to make an eating plan that is right for you.  Do  not use any products that contain nicotine or tobacco, such as cigarettes, e-cigarettes, and chewing tobacco. If you need help quitting, ask your health care provider.  Do not drink alcohol.  Do not use drugs, including cannabis.  Lose weight if you are overweight or obese.  Exercise  regularly as instructed by your health care provider. General instructions  Do not use diet pills unless your health care provider approves. Diet pills may make heart problems worse.  If you have obstructive sleep apnea, manage your condition as told by your health care provider.  Keep all follow-up visits as told by your health care provider. This is important. Contact a health care provider if you:  Notice a change in the rate, rhythm, or strength of your heartbeat.  Are taking a blood thinner and you notice more bruising.  Have a sudden change in weight.  Tire more easily when you exercise or do heavy work. Get help right away if you have:  Pain or pressure in your chest.  Shortness of breath.  Fainting.  Increasing sweating with no known cause.  Side effects of blood thinners, such as blood in your vomit, stool, or urine, or bleeding that cannot stop.  Any symptoms of a stroke. "BE FAST" is an easy way to remember the main warning signs of a stroke: ? B - Balance. Signs are dizziness, sudden trouble walking, or loss of balance. ? E - Eyes. Signs are trouble seeing or a sudden change in vision. ? F - Face. Signs are sudden weakness or numbness of the face, or the face or eyelid drooping on one side. ? A - Arms. Signs are weakness or numbness in an arm. This happens suddenly and usually on one side of the body. ? S - Speech. Signs are sudden trouble speaking, slurred speech, or trouble understanding what people say. ? T - Time. Time to call emergency services. Write down what time symptoms started.  Other signs of a stroke, such as: ? A sudden, severe headache with no known cause. ? Nausea or vomiting. ? Seizure.  These symptoms may represent a serious problem that is an emergency. Do not wait to see if the symptoms will go away. Get medical help right away. Call your local emergency services (911 in the [place] Do not drive yourself to the hospital. Summary  Atrial  flutter is an abnormal heart rhythm that can give you symptoms of palpitations, shortness of breath, or fatigue.  Atrial flutter is often treated with medicines to keep your heart in a normal rhythm and to prevent a stroke.  Get help right away if you cannot catch your breath, or have chest pain or pressure.  Get help right away if you have signs or symptoms of a stroke. This information is not intended to replace advice given to you by your health care provider. Make sure you discuss any questions you have with your health care provider. Document Revised: 08/31/2018 Document Reviewed: 08/31/2018 Elsevier Patient Education  [place] Heart-healthy meal planning includes:  Eating less unhealthy fats.  Eating more healthy fats.  Making other changes in your diet. Talk with your doctor or a diet specialist (dietitian) to create an eating plan that is right for you. What is my plan? Your doctor may recommend an eating plan that includes:  Total fat: ______% or less of total calories a day.  Saturated fat: ______% or less of total calories a day.  Cholesterol: less than  _________mg a day. What are tips for following this plan? Cooking Avoid frying your food. Try to bake, boil, grill, or broil it instead. You can also reduce fat by:  Removing the skin from poultry.  Removing all visible fats from meats.  Steaming vegetables in water or broth. Meal planning   At meals, divide your plate into four equal parts: ? Fill one-half of your plate with vegetables and green salads. ? Fill one-fourth of your plate with whole grains. ? Fill one-fourth of your plate with lean protein foods.  Eat 4-5 servings of vegetables per day. A serving of vegetables is: ? 1 cup of raw or cooked vegetables. ? 2 cups of raw leafy greens.  Eat 4-5 servings of fruit per day. A serving of fruit is: ? 1 medium whole fruit. ?  cup of dried fruit. ?  cup of fresh,  frozen, or canned fruit. ?  cup of 100% fruit juice.  Eat more foods that have soluble fiber. These are apples, broccoli, carrots, beans, peas, and barley. Try to get 20-30 g of fiber per day.  Eat 4-5 servings of nuts, legumes, and seeds per week: ? 1 serving of dried beans or legumes equals  cup after being cooked. ? 1 serving of nuts is  cup. ? 1 serving of seeds equals 1 tablespoon. General information  Eat more home-cooked food. Eat less restaurant, buffet, and fast food.  Limit or avoid alcohol.  Limit foods that are high in starch and sugar.  Avoid fried foods.  Lose weight if you are overweight.  Keep track of how much salt (sodium) you eat. This is important if you have high blood pressure. Ask your doctor to tell you more about this.  Try to add vegetarian meals each week. Fats  Choose healthy fats. These include olive oil and canola oil, flaxseeds, walnuts, almonds, and seeds.  Eat more omega-3 fats. These include salmon, mackerel, sardines, tuna, flaxseed oil, and ground flaxseeds. Try to eat fish at least 2 times each week.  Check food labels. Avoid foods with trans fats or high amounts of saturated fat.  Limit saturated fats. ? These are often found in animal products, such as meats, butter, and cream. ? These are also found in plant foods, such as palm oil, palm kernel oil, and coconut oil.  Avoid foods with partially hydrogenated oils in them. These have trans fats. Examples are stick margarine, some tub margarines, cookies, crackers, and other baked goods. What foods can I eat? Fruits All fresh, canned (in natural juice), or frozen fruits. Vegetables Fresh or frozen vegetables (raw, steamed, roasted, or grilled). Green salads. Grains Most grains. Choose whole wheat and whole grains most of the time. Rice and pasta, including brown rice and pastas made with whole wheat. Meats and other proteins Lean, well-trimmed beef, veal, pork, and lamb. Chicken and  [place] without skin. All fish and shellfish. Wild duck, rabbit, pheasant, and venison. Egg whites or low-cholesterol egg substitutes. Dried beans, peas, lentils, and tofu. Seeds and most nuts. Dairy Low-fat or nonfat cheeses, including ricotta and mozzarella. Skim or 1% milk that is liquid, powdered, or evaporated. Buttermilk that is made with low-fat milk. Nonfat or low-fat yogurt. Fats and oils Non-hydrogenated (trans-free) margarines. Vegetable oils, including soybean, sesame, sunflower, olive, peanut, safflower, corn, canola, and cottonseed. Salad dressings or mayonnaise made with a vegetable oil. Beverages [place] water. Coffee and tea. Diet carbonated beverages. Sweets and desserts Sherbet, gelatin, and fruit ice. Small amounts of dark chocolate.  Limit all sweets and desserts. Seasonings and condiments All seasonings and condiments. The items listed above may not be a complete list of foods and drinks you can eat. Contact a dietitian for more options. What foods should I avoid? Fruits Canned fruit in heavy syrup. Fruit in cream or butter sauce. Fried fruit. Limit coconut. Vegetables Vegetables cooked in cheese, cream, or butter sauce. Fried vegetables. Grains Breads that are made with saturated or trans fats, oils, or whole milk. Croissants. Sweet rolls. Donuts. High-fat crackers, such as cheese crackers. Meats and other proteins Fatty meats, such as hot dogs, ribs, sausage, bacon, rib-eye roast or steak. High-fat deli meats, such as salami and bologna. Caviar. Domestic duck and goose. Organ meats, such as liver. Dairy Cream, sour cream, cream cheese, and creamed cottage cheese. Whole-milk cheeses. Whole or 2% milk that is liquid, evaporated, or condensed. Whole buttermilk. Cream sauce or high-fat cheese sauce. Yogurt that is made from whole milk. Fats and oils Meat fat, or shortening. Cocoa butter, hydrogenated oils, palm oil, coconut oil, palm kernel oil. Solid fats and shortenings,  including bacon fat, salt pork, lard, and butter. Nondairy cream substitutes. Salad dressings with cheese or sour cream. Beverages Regular sodas and juice drinks with added sugar. Sweets and desserts Frosting. Pudding. Cookies. Cakes. Pies. Milk chocolate or white chocolate. Buttered syrups. Full-fat ice cream or ice cream drinks. The items listed above may not be a complete list of foods and drinks to avoid. Contact a dietitian for more information. Summary  Heart-healthy meal planning includes eating less unhealthy fats, eating more healthy fats, and making other changes in your diet.  Eat a balanced diet. This includes fruits and vegetables, low-fat or nonfat dairy, lean protein, nuts and legumes, whole grains, and heart-healthy oils and fats. This information is not intended to replace advice given to you by your health care provider. Make sure you discuss any questions you have with your health care provider. Document Revised: 05/13/2017 Document Reviewed: 04/16/2017 Elsevier Patient Education  2020 [place] on my medicine - ELIQUIS (apixaban)  This medication education was reviewed with me or my healthcare representative as part of my discharge preparation.    Why was Eliquis prescribed for you? Eliquis was prescribed for you to reduce the risk of a blood clot forming that can cause a stroke if you have a medical condition called atrial fibrillation (a type of irregular heartbeat).  What do You need to know about Eliquis ? Take your Eliquis TWICE DAILY - one tablet in the morning and one tablet in the evening with or without food. If you have difficulty swallowing the tablet whole please discuss with your pharmacist how to take the medication safely.  Take Eliquis exactly as prescribed by your doctor and DO NOT stop taking Eliquis without talking to the doctor who prescribed the medication.  Stopping may increase your risk of developing a stroke.  Refill  your prescription before you run out.  After discharge, you should have regular check-up appointments with your healthcare provider that is prescribing your Eliquis.  In the future your dose may need to be changed if your kidney function or weight changes by a significant amount or as you get older.  What do you do if you miss a dose? If you miss a dose, take it as soon as you remember on the same day and resume taking twice daily.  Do not take more than one dose of ELIQUIS at the same time to make  up a missed dose.  Important Safety Information A possible side effect of Eliquis is bleeding. You should call your healthcare provider right away if you experience any of the following: ? Bleeding from an injury or your nose that does not stop. ? Unusual colored urine (red or dark brown) or unusual colored stools (red or black). ? Unusual bruising for unknown reasons. ? A serious fall or if you hit your head (even if there is no bleeding).  Some medicines may interact with Eliquis and might increase your risk of bleeding or clotting while on Eliquis. To help avoid this, consult your healthcare provider or pharmacist prior to using any new prescription or non-prescription medications, including herbals, vitamins, non-steroidal anti-inflammatory drugs (NSAIDs) and supplements.  This website has more information on Eliquis (apixaban): http://www.eliquis.com/eliquis/home

## 2020-01-22 NOTE — ED Provider Notes (Signed)
[facility] EMERGENCY DEPARTMENT Provider Note   CSN: [id number] Arrival date & time: 01/22/20  1324     History Chief Complaint  Patient presents with  . Chest Pain  . Shortness of Breath    Savannah Hobbs is a 74 y.o. 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 fib that presents emergency department today for shortness of breath and chest pain and weakness since Saturday.  Patient states that she has been prescribed metoprolol for her A. fib, however takes it whenever she has an episode of A. fib.  Patient states that her last episode of A. fib was in November of last year, then she had an episode on Saturday.  States that  On Saturday her heart rate was 178 and she felt short of breath however did not coming to the emergency department.  States that she took her metoprolol and her heart rate down to 130, has been taking her metoprolol since then.  States that she last took her metoprolol, 25 mg, this morning at 11 and her heart rate has remained at 130.  Still feels fatigued and short of breath.  Denies any inciting events on Saturday.  No alcohol use.  Denies any nausea, vomiting, fevers, cough, sore throat, congestion, abdominal pain, back pain..  Denies any history of heart failure or thyroid disease.  Denies any history of clotting disorder, recent surgery.  Denies any history of cancer.  Did report a recent long car ride over 12 hours last month.  No leg swelling or calf pain.  Is not on any anticoagulation.  Did receive 1 dose of her vaccine, states that she went into A. fib therefore did not take the second dose.  HPI     Past Medical History:  Diagnosis Date  . Allergy   . Arthritis   . Asthma   . Cirrhosis of liver ([place])    due to medications for arthritis-   . Elevated cholesterol   . Erosive esophagitis 1995  . Fatty liver   . GERD (gastroesophageal reflux disease)   .  Heart murmur   . Hiatal hernia    LARGE  . Hx of colonic polyps 05/27/2018  . Hx of transfusion of platelets   . Thrombocytopenia ([place])   . UTI (lower urinary tract infection)     Patient Active Problem List   Diagnosis Date Noted  . Hx of colonic polyps 05/27/2018    Past Surgical History:  Procedure Laterality Date  . ABDOMINAL HYSTERECTOMY     partial  . APPENDECTOMY    . COLONOSCOPY    . ESOPHAGOGASTRODUODENOSCOPY    . GYNECOLOGIC CRYOSURGERY     x2  . REPLACEMENT TOTAL KNEE Left 2016  . TONSILLECTOMY    . TOTAL KNEE ARTHROPLASTY Right   . TUBAL LIGATION    . UPPER GASTROINTESTINAL ENDOSCOPY       OB History   No obstetric history on file.     Family History  Problem Relation Age of Onset  . Esophageal cancer Father   . Hypertension Mother   . Colon polyps Neg Hx   . Colon cancer Neg Hx   . Rectal cancer Neg Hx   . Stomach cancer Neg Hx     Social History   Tobacco Use  . Smoking status: Never Smoker  . Smokeless tobacco: Never Used  Substance Use Topics  . Alcohol use: No  . Drug use:  No    Home Medications Prior to Admission medications   Medication Sig Start Date End Date Taking? Authorizing Provider  Ascorbic Acid (VITAMIN C) 1000 MG tablet Take 1,000 mg by mouth daily.    [provider]  Cholecalciferol (VITAMIN D3) 2000 units TABS Take 1 tablet by mouth daily.    [provider]  docusate sodium (COLACE) 100 MG capsule Take 100 mg by mouth 2 (two) times daily.    [provider]  esomeprazole (NEXIUM) 20 MG capsule Take 20 mg by mouth daily at 12 noon.    [provider]  hydrochlorothiazide (HYDRODIURIL) 25 MG tablet Take 1 tablet by mouth as needed. 11/03/15   [provider]  Omega-3 Fatty Acids (FISH OIL) 1200 MG CAPS Take 1 capsule by mouth daily.    [provider]  pantoprazole (PROTONIX) 20 MG tablet Take 1 tablet (20 mg total) by mouth daily before breakfast. Patient not taking:  Reported on 05/24/2018 04/22/16   Gatha Mayer, MD  simvastatin (ZOCOR) 20 MG tablet Take 20 mg by mouth daily.     [provider]    Allergies    Iodine and Shellfish allergy  Review of Systems   Review of Systems  Constitutional: Positive for fatigue. Negative for chills, diaphoresis and fever.  HENT: Negative for congestion, sore throat and trouble swallowing.   Eyes: Negative for pain and visual disturbance.  Respiratory: Positive for shortness of breath. Negative for cough and wheezing.   Cardiovascular: Positive for chest pain. Negative for palpitations and leg swelling.  Gastrointestinal: Negative for abdominal distention, abdominal pain, diarrhea, nausea and vomiting.  Genitourinary: Negative for difficulty urinating.  Musculoskeletal: Negative for back pain, neck pain and neck stiffness.  Skin: Negative for pallor.  Neurological: Positive for weakness. Negative for dizziness, speech difficulty and headaches.  Psychiatric/Behavioral: Negative for confusion.    Physical Exam Updated Vital Signs BP 101/79   Pulse (!) 138   Temp 98 F (36.7 C) (Oral)   Resp 17   Ht 5\' 5"  (1.651 m)   Wt 93.4 kg   SpO2 97%   BMI 34.28 kg/m   Physical Exam Constitutional:      General: She is not in acute distress.    Appearance: Normal appearance. She is not ill-appearing, toxic-appearing or diaphoretic.  HENT:     Mouth/Throat:     Mouth: Mucous membranes are moist.     Pharynx: Oropharynx is clear.  Eyes:     General: No scleral icterus.    Extraocular Movements: Extraocular movements intact.     Pupils: Pupils are equal, round, and reactive to light.  Neck:     Thyroid: Thyromegaly present.  Cardiovascular:     Rate and Rhythm: Tachycardia present. Rhythm irregular.     Pulses: Normal pulses.     Heart sounds: Normal heart sounds.  Pulmonary:     Effort: Pulmonary effort is normal. No respiratory distress.     Breath sounds: Normal breath sounds. No stridor. No  wheezing, rhonchi or rales.  Chest:     Chest wall: No tenderness.  Abdominal:     General: Abdomen is flat. There is no distension.     Palpations: Abdomen is soft.     Tenderness: There is no abdominal tenderness. There is no guarding or rebound.  Musculoskeletal:        General: No swelling or tenderness. Normal range of motion.     Cervical back: Normal range of motion and neck supple.  No rigidity.     Right lower leg: No edema.     Left lower leg: No edema.  Skin:    General: Skin is warm and dry.     Capillary Refill: Capillary refill takes less than 2 seconds.     Coloration: Skin is not pale.  Neurological:     General: No focal deficit present.     Mental Status: She is alert and oriented to person, place, and time.  Psychiatric:        Mood and Affect: Mood normal.        Behavior: Behavior normal.     ED Results / Procedures / Treatments   Labs (all labs ordered are listed, but only abnormal results are displayed) Labs Reviewed  BASIC METABOLIC PANEL - Abnormal; Notable for the following components:      Result Value   Glucose, Bld 122 (*)    Calcium 8.8 (*)    All other components within normal limits  CBC - Abnormal; Notable for the following components:   WBC 25.9 (*)    Hemoglobin 11.6 (*)    RDW 21.7 (*)    All other components within normal limits  BRAIN NATRIURETIC PEPTIDE - Abnormal; Notable for the following components:   B Natriuretic Peptide 479.7 (*)    All other components within normal limits  D-DIMER, QUANTITATIVE (NOT AT [facility]) - Abnormal; Notable for the following components:   D-Dimer, Quant 2.01 (*)    All other components within normal limits  MAGNESIUM - Abnormal; Notable for the following components:   Magnesium 1.6 (*)    All other components within normal limits  RESPIRATORY PANEL BY RT PCR (FLU A&B, COVID)  TSH  URINALYSIS, COMPLETE (UACMP) WITH MICROSCOPIC  PROTIME-INR  TROPONIN I (HIGH SENSITIVITY)  TROPONIN I (HIGH  SENSITIVITY)    EKG EKG Interpretation  Date/Time:  Monday January 22 2020 13:38:16 EDT Ventricular Rate:  128 PR Interval:    QRS Duration: 100 QT Interval:  342 QTC Calculation: 499 R Axis:   1 Text Interpretation: Atrial flutter with variable A-V block No previous tracing Confirmed by Blanchie Dessert [phone]) on 01/22/2020 4:05:59 PM   Radiology DG Chest 2 View  Result Date: 01/22/2020 CLINICAL DATA:  Shortness of breath. Additional history provided: Shortness of breath, weakness, lightheadedness, mid chest discomfort radiating to right side since Saturday, history of heart murmur and asthma. EXAM: CHEST - 2 VIEW COMPARISON:  No pertinent prior exams are available for comparison. FINDINGS: Heart size within normal limits. Aortic atherosclerosis. No appreciable airspace consolidation or pulmonary edema. No evidence of pleural effusion or pneumothorax. No acute bony abnormality identified. Chronic deformity of the proximal right humerus. IMPRESSION: No evidence of acute cardiopulmonary abnormality. Aortic Atherosclerosis (ICD10-I70.0). Electronically Signed   By: Kellie Simmering DO   On: 01/22/2020 14:16   CT Angio Chest PE W/Cm &/Or Wo Cm  Result Date: 01/22/2020 CLINICAL DATA:  Chest pain and shortness of breath for 2 days EXAM: CT ANGIOGRAPHY CHEST WITH CONTRAST TECHNIQUE: Multidetector CT imaging of the chest was performed using the standard protocol during bolus administration of intravenous contrast. Multiplanar CT image reconstructions and MIPs were obtained to evaluate the vascular anatomy. CONTRAST:  50mL OMNIPAQUE IOHEXOL 350 MG/ML SOLN COMPARISON:  Chest x-ray from earlier in the same day. FINDINGS: Cardiovascular: Thoracic aorta shows atherosclerotic calcifications without aneurysmal dilatation or dissection. Mild cardiac enlargement is noted. Coronary calcifications are seen. Pulmonary artery shows a normal branching pattern. No filling defect to suggest pulmonary  embolism is  noted. Mediastinum/Nodes: Thoracic inlet is within normal limits. Scattered small hilar and mediastinal lymph nodes are noted. No sizable adenopathy is noted. The esophagus as visualized is within normal limits. Lungs/Pleura: Lungs are well aerated bilaterally and demonstrate some mild air trapping. No focal confluent infiltrate is seen. No sizable effusion or pneumothorax is noted. Upper Abdomen: Spleen is enlarged. Mild changes of cirrhosis of the liver are seen. Findings of portal hypertension are noted with recanalization of the umbilical vein and collateral flow in the anterior abdominal wall. Musculoskeletal: No chest wall abnormality. No acute or significant osseous findings. Review of the MIP images confirms the above findings. IMPRESSION: No evidence of pulmonary emboli. 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 Air trapping is seen bilaterally without focal infiltrate. Electronically Signed   By: Inez Catalina M.D.   On: 01/22/2020 19:24    Procedures Procedures (including critical care time)  Medications Ordered in ED Medications  diltiazem (CARDIZEM) 125 mg in dextrose 5% 125 mL (1 mg/mL) infusion (5 mg/hr Intravenous New Bag/Given 01/22/20 2004)  diltiazem (CARDIZEM) injection 20 mg (20 mg Intravenous Given 01/22/20 1707)  iohexol (OMNIPAQUE) 350 MG/ML injection 100 mL (75 mLs Intravenous Contrast Given 01/22/20 1857)    ED Course  I have reviewed the triage vital signs and the nursing notes.  Pertinent labs & imaging results that were available during my care of the patient were reviewed by me and considered in my medical decision making (see chart for details).    MDM Rules/Calculators/A&P                         Taron Conrey is a 74 y.o. 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 fib that presents emergency department today for shortness of breath and chest pain  and weakness since Saturday.  Patient appears stable, heart rate in the 120s to 130s, monitor shows a flutter.  Unsure why patient went into A. fib, however will obtain work-up at this time.  Most likely because of medication noncompliance, patient is not anticoagulated.  CHA2DS2-VASc 2.  Initial interventions include diltiazem bolus.  Work-up today with white count of 25.9, this could be reactive, no other infectious symptoms.  Patient will need to get this repeated by PCP.  Patient states that she did have spleen problems and liver problems after taking her medications, therefore this could be reactive.  BMP normal.  Troponin7 .  D-dimer elevated to 2.01, therefore will obtain CT PE study at this time.  PE study negative, no acute cardiothoracic abnormality on CT scan.  Patient's rate did slightly improve with dilt bolus, however climbed up to 142, dilt infusion in process.  Will admit to the hospitalist at this time for rate control.  Patient shows A. fib on monitor, however will often go into a flutter.  Pressure stable.   830 spoke to Dr. Koleen Distance, IM residents who will accept the patient.  The patient appears reasonably stabilized for admission considering the current resources, flow, and capabilities available in the ED at this time, and I doubt any other [facility] requiring further screening and/or treatment in the ED prior to admission.  I discussed this case with my attending physician who cosigned this note including patient's presenting symptoms, physical exam, and planned diagnostics and interventions. Attending physician stated agreement with plan or made changes to plan which were implemented.  Attending physician assessed patient at bedside.     Final Clinical Impression(s) / ED Diagnoses Final diagnoses:  Atrial fibrillation with RVR [facility])    Rx / DC Orders ED Discharge Orders    None       Alfredia Client, PA-C 01/22/20 2051    Blanchie Dessert, MD 01/22/20 2202

## 2020-01-22 NOTE — ED Notes (Signed)
Patient transported to CT 

## 2020-01-22 NOTE — ED Triage Notes (Signed)
Pt presents with intermittent mid-sternum chest pressure and SOB since Saturday. Pt reports HR got a hig as 178, hx of A-fib

## 2020-01-22 NOTE — ED Notes (Signed)
Pt returned from CT °

## 2020-01-23 ENCOUNTER — Other Ambulatory Visit (HOSPITAL_COMMUNITY): Payer: Medicare Other

## 2020-01-23 ENCOUNTER — Observation Stay (HOSPITAL_COMMUNITY): Payer: Medicare Other

## 2020-01-23 DIAGNOSIS — I2510 Atherosclerotic heart disease of native coronary artery without angina pectoris: Secondary | ICD-10-CM | POA: Diagnosis present

## 2020-01-23 DIAGNOSIS — R2243 Localized swelling, mass and lump, lower limb, bilateral: Secondary | ICD-10-CM

## 2020-01-23 DIAGNOSIS — Z888 Allergy status to other drugs, medicaments and biological substances status: Secondary | ICD-10-CM | POA: Diagnosis not present

## 2020-01-23 DIAGNOSIS — I350 Nonrheumatic aortic (valve) stenosis: Secondary | ICD-10-CM | POA: Diagnosis not present

## 2020-01-23 DIAGNOSIS — K30 Functional dyspepsia: Secondary | ICD-10-CM | POA: Diagnosis present

## 2020-01-23 DIAGNOSIS — Z91013 Allergy to seafood: Secondary | ICD-10-CM | POA: Diagnosis not present

## 2020-01-23 DIAGNOSIS — I10 Essential (primary) hypertension: Secondary | ICD-10-CM

## 2020-01-23 DIAGNOSIS — I4892 Unspecified atrial flutter: Secondary | ICD-10-CM

## 2020-01-23 DIAGNOSIS — I480 Paroxysmal atrial fibrillation: Secondary | ICD-10-CM | POA: Diagnosis present

## 2020-01-23 DIAGNOSIS — K7460 Unspecified cirrhosis of liver: Secondary | ICD-10-CM | POA: Diagnosis present

## 2020-01-23 DIAGNOSIS — E669 Obesity, unspecified: Secondary | ICD-10-CM | POA: Diagnosis present

## 2020-01-23 DIAGNOSIS — R079 Chest pain, unspecified: Secondary | ICD-10-CM | POA: Diagnosis not present

## 2020-01-23 DIAGNOSIS — Z79899 Other long term (current) drug therapy: Secondary | ICD-10-CM | POA: Diagnosis not present

## 2020-01-23 DIAGNOSIS — Z23 Encounter for immunization: Secondary | ICD-10-CM | POA: Diagnosis present

## 2020-01-23 DIAGNOSIS — I340 Nonrheumatic mitral (valve) insufficiency: Secondary | ICD-10-CM | POA: Diagnosis not present

## 2020-01-23 DIAGNOSIS — M1990 Unspecified osteoarthritis, unspecified site: Secondary | ICD-10-CM | POA: Diagnosis present

## 2020-01-23 DIAGNOSIS — Z8249 Family history of ischemic heart disease and other diseases of the circulatory system: Secondary | ICD-10-CM | POA: Diagnosis not present

## 2020-01-23 DIAGNOSIS — K219 Gastro-esophageal reflux disease without esophagitis: Secondary | ICD-10-CM | POA: Diagnosis present

## 2020-01-23 DIAGNOSIS — J45909 Unspecified asthma, uncomplicated: Secondary | ICD-10-CM | POA: Diagnosis present

## 2020-01-23 DIAGNOSIS — Z87440 Personal history of urinary (tract) infections: Secondary | ICD-10-CM | POA: Diagnosis not present

## 2020-01-23 DIAGNOSIS — I361 Nonrheumatic tricuspid (valve) insufficiency: Secondary | ICD-10-CM | POA: Diagnosis not present

## 2020-01-23 DIAGNOSIS — Z8616 Personal history of COVID-19: Secondary | ICD-10-CM | POA: Diagnosis not present

## 2020-01-23 DIAGNOSIS — Z90711 Acquired absence of uterus with remaining cervical stump: Secondary | ICD-10-CM | POA: Diagnosis not present

## 2020-01-23 DIAGNOSIS — Z20822 Contact with and (suspected) exposure to covid-19: Secondary | ICD-10-CM | POA: Diagnosis present

## 2020-01-23 DIAGNOSIS — I700 Atherosclerosis of aorta: Secondary | ICD-10-CM | POA: Diagnosis present

## 2020-01-23 DIAGNOSIS — E785 Hyperlipidemia, unspecified: Secondary | ICD-10-CM | POA: Diagnosis present

## 2020-01-23 DIAGNOSIS — D6959 Other secondary thrombocytopenia: Secondary | ICD-10-CM | POA: Diagnosis present

## 2020-01-23 LAB — LIPID PANEL
Cholesterol: 125 mg/dL (ref 0–200)
HDL: 44 mg/dL (ref >40)
LDL Cholesterol: 53 mg/dL (ref 0–99)
Total CHOL/HDL Ratio: 2.8 RATIO
Triglycerides: 142 mg/dL (ref <150)
VLDL: 28 mg/dL (ref 0–40)

## 2020-01-23 LAB — ECHOCARDIOGRAM COMPLETE
AR max vel: 1.52 cm2
AV Area VTI: 1.59 cm2
AV Area mean vel: 1.45 cm2
AV Mean grad: 11 mmHg
AV Peak grad: 18.5 mmHg
Ao pk vel: 2.15 m/s
Area-P 1/2: 3.6 cm2
Height: 65 in
S' Lateral: 3 cm
Weight: 3275.2 oz

## 2020-01-23 LAB — CBC WITH DIFFERENTIAL/PLATELET
Abs Immature Granulocytes: 1 10*3/uL — ABNORMAL HIGH (ref 0.00–0.07)
Basophils Absolute: 0.1 10*3/uL (ref 0.0–0.1)
Basophils Relative: 1 %
Eosinophils Absolute: 0.2 10*3/uL (ref 0.0–0.5)
Eosinophils Relative: 1 %
HCT: 34.7 % — ABNORMAL LOW (ref 36.0–46.0)
Hemoglobin: 10.8 g/dL — ABNORMAL LOW (ref 12.0–15.0)
Immature Granulocytes: 6 %
Lymphocytes Relative: 7 %
Lymphs Abs: 1.2 10*3/uL (ref 0.7–4.0)
MCH: 26.3 pg (ref 26.0–34.0)
MCHC: 31.1 g/dL (ref 30.0–36.0)
MCV: 84.4 fL (ref 80.0–100.0)
Monocytes Absolute: 0.9 10*3/uL (ref 0.1–1.0)
Monocytes Relative: 5 %
Neutro Abs: 13.1 10*3/uL — ABNORMAL HIGH (ref 1.7–7.7)
Neutrophils Relative %: 80 %
Platelets: 143 10*3/uL — ABNORMAL LOW (ref 150–400)
RBC: 4.11 MIL/uL (ref 3.87–5.11)
RDW: 21.9 % — ABNORMAL HIGH (ref 11.5–15.5)
WBC: 16.5 10*3/uL — ABNORMAL HIGH (ref 4.0–10.5)
nRBC: 0 % (ref 0.0–0.2)

## 2020-01-23 LAB — PROTIME-INR
INR: 1.6 — ABNORMAL HIGH (ref 0.8–1.2)
Prothrombin Time: 18.8 seconds — ABNORMAL HIGH (ref 11.4–15.2)

## 2020-01-23 MED ORDER — PERFLUTREN LIPID MICROSPHERE
1.0000 mL | INTRAVENOUS | Status: AC | PRN
  Administered 2020-01-23: 3 mL via INTRAVENOUS
  Filled 2020-01-23: qty 10

## 2020-01-23 MED ORDER — APIXABAN 5 MG PO TABS
5.0000 mg | ORAL_TABLET | Freq: Two times a day (BID) | ORAL | Status: DC
  Administered 2020-01-23 – 2020-01-24 (×2): 5 mg via ORAL
  Filled 2020-01-23 (×2): qty 1

## 2020-01-23 MED ORDER — DILTIAZEM HCL ER 90 MG PO CP12
90.0000 mg | ORAL_CAPSULE | Freq: Two times a day (BID) | ORAL | Status: DC
  Administered 2020-01-23 – 2020-01-24 (×3): 90 mg via ORAL
  Filled 2020-01-23 (×4): qty 1

## 2020-01-23 MED ORDER — INFLUENZA VAC A&B SA ADJ QUAD 0.5 ML IM PRSY
0.5000 mL | PREFILLED_SYRINGE | INTRAMUSCULAR | Status: AC
  Administered 2020-01-24: 0.5 mL via INTRAMUSCULAR
  Filled 2020-01-23: qty 0.5

## 2020-01-23 NOTE — Progress Notes (Signed)
  Echocardiogram 2D Echocardiogram has been performed.  Carrell Palmatier G Nahla Lukin 01/23/2020, 10:08 AM

## 2020-01-23 NOTE — Progress Notes (Signed)
   01/23/20 0200  Assess: MEWS Score  Temp 98.2 F (36.8 C)  BP 100/60  Pulse Rate (!) 105  ECG Heart Rate (!) 105  SpO2 94 %  O2 Device Room Air  Assess: MEWS Score  MEWS Temp 0  MEWS Systolic 1  MEWS Pulse 1  MEWS RR 0  MEWS LOC 0  MEWS Score 2  MEWS Score Color Yellow  Assess: if the MEWS score is Yellow or Red  Were vital signs taken at a resting state? Yes  Focused Assessment No change from prior assessment  Early Detection of Sepsis Score *See Row Information* Low  MEWS guidelines implemented *See Row Information* No, previously yellow, continue vital signs every 4 hours  Notify: Charge Nurse/RN  Name of Charge Nurse/RN Notified Christina RN  Date Charge Nurse/RN Notified 01/23/20  Time Charge Nurse/RN Notified 0200  Notify: Rapid Response  Name of Rapid Response RN Notified N/A  Document  Patient Outcome Other (Comment) (Remains on Cardizem gtt)  Progress note created (see row info) Yes

## 2020-01-23 NOTE — Consult Note (Addendum)
Cardiology Consultation:   Patient ID: Anzleigh Slaven MRN: [id number]; DOB: [birth year]  Admit date: 01/22/2020 Date of Consult: 01/23/2020  Primary Care Provider: Merryl Hacker No [facility] Cardiologist: Donato Heinz, MD new [facility] Electrophysiologist:  None    Patient Profile:   Blondine Hottel is a 74 y.o. female with a hx of PAF not on anticoagulation, thrombocytopenia, fatty liver with cirrhosis, HLD, and GERD who is being seen today for the evaluation of atrial flutter with RVR at the request of Dr. Evette Doffing.  History of Present Illness:   Ms. Hingle received care through another health system, she lives near [place]. She does report a history of palpitations and PAF, but has not been anticoagulated due to thrombocytopenia. Diagnosed with Afib in 12/2018 following a 6-day hospitalization with COVID infection. About 2 days after discharge, she had Afib which spontaneously resolved. She had another bout of Afib after her first moderna vaccination in January. She saw cardiology and was placed on lopressor. Unfortunately, she did not tolerated lopressor 25 mg BID - made her feel bad. Now uses lopressor PRN.   She presented to [facility] with a two day history of palpitations and dyspnea on exertion found to be in atrial flutter RVR. She woke up with palpitations Saturday morning at 6AM, HR in the 170s. She took PRN lopressor throughout the weekend with HR improved to the 130-140s without complete resolution of symptoms. She felt dyspnea on exertion and mild chest pressure. On arrival, she was treated with IV diltiazem and gtt. She converted to NSR at 1018 am. She remains on IV cardizem 7.5 mg/hr with HR in the 80s.   Echocardiogram obtained shows EF 60-65%, normal RV function, moderately dilated left atrium, and mild AS.   She reports resolution of chest pressure. She is not sure if she is always aware of her PAF. She reports a history of stress test, unclear when or where.   She is a  retired Marine scientist. 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 Thrombocytopenia found prior to knee replacement - received platelets to get her surgery. Follow up with heme-onc. Suspected due to cirrhosis.   Past Medical History:  Diagnosis Date  . Allergy   . Arthritis   . Asthma   . Cirrhosis of liver ([place])    due to medications for arthritis-   . Elevated cholesterol   . Erosive esophagitis 1995  . Fatty liver   . GERD (gastroesophageal reflux disease)   . Heart murmur   . Hiatal hernia    LARGE  . Hx of colonic polyps 05/27/2018  . Hx of transfusion of platelets   . Thrombocytopenia ([place])   . UTI (lower urinary tract infection)     Past Surgical History:  Procedure Laterality Date  . ABDOMINAL HYSTERECTOMY     partial  . APPENDECTOMY    . COLONOSCOPY    . ESOPHAGOGASTRODUODENOSCOPY    . GYNECOLOGIC CRYOSURGERY     x2  . REPLACEMENT TOTAL KNEE Left 2016  . TONSILLECTOMY    . TOTAL KNEE ARTHROPLASTY Right   . TUBAL LIGATION    . UPPER GASTROINTESTINAL ENDOSCOPY       Home Medications:  Prior to Admission medications   Medication Sig Start Date End Date Taking? Authorizing Provider  Ascorbic Acid (VITAMIN C) 1000 MG tablet Take 1,000 mg by mouth daily.   Yes [provider]  cetirizine (ZYRTEC) 10 MG tablet Take 10 mg by mouth in the morning.   Yes [provider]  Cholecalciferol (VITAMIN D-3) 25 MCG (1000 UT) CAPS Take 1,000 Units by mouth daily.   Yes [provider]  docusate sodium (COLACE) 100 MG capsule Take 100 mg by mouth in the morning.    Yes [provider]  esomeprazole (NEXIUM 24HR) 20 MG capsule Take 20 mg by mouth daily before breakfast.   Yes [provider]  hydrochlorothiazide (HYDRODIURIL) 25 MG tablet Take 25 mg by mouth daily.  11/03/15  Yes [provider]  ibuprofen (ADVIL) 200 MG tablet Take 200 mg by mouth every 6 (six) hours as needed for mild pain (or headaches).   Yes [provider]  simvastatin (ZOCOR) 40 MG tablet Take 40 mg by mouth at bedtime.   Yes [provider]  pantoprazole (PROTONIX) 20 MG tablet Take 1 tablet (20 mg total) by mouth daily before breakfast. Patient not taking: Reported on 01/22/2020 04/22/16   Gatha Mayer, MD    Inpatient Medications: Scheduled Meds: . enoxaparin (LOVENOX) injection  40 mg Subcutaneous Q24H  . [START ON 01/24/2020] influenza vaccine adjuvanted  0.5 mL Intramuscular Tomorrow-1000  . pantoprazole  40 mg Oral Daily  . simvastatin  20 mg Oral Daily   Continuous Infusions: . diltiazem (CARDIZEM) infusion 7.5 mg/hr (01/23/20 0600)   PRN Meds: acetaminophen, ondansetron (ZOFRAN) IV  Allergies:    Allergies  Allergen Reactions  . Iodine Hives  . Shellfish Allergy Hives    Social History:   Social History   Socioeconomic History  . Marital status: Married    Spouse name: Not on file  . Number of children: 3  . Years of education: Not on file  . Highest education level: Not on file  Occupational History  . Occupation: retired Therapist, sports  Tobacco Use  . Smoking status: Never Smoker  . Smokeless tobacco: Never Used  Substance and Sexual Activity  . Alcohol use: No  . Drug use: No  . Sexual activity: Not on file  Other Topics Concern  . Not on file  Social History Narrative   Married, retired Marine scientist from [employer].   3 sons, 11/16/15 1 son died in his sleep unclear cause   Updated 12/02/2015   Social Determinants of Health   Financial Resource Strain:   . Difficulty of Paying Living Expenses: Not on file  Food Insecurity:   . Worried About Charity fundraiser in the Last Year: Not on file  . Ran Out of Food in the Last Year: Not on file  Transportation Needs:   . Lack of Transportation (Medical): Not on file  . Lack of Transportation (Non-Medical): Not on file  Physical Activity:   . Days of Exercise per Week: Not on file  . Minutes of Exercise per Session: Not on file    Stress:   . Feeling of Stress : Not on file  Social Connections:   . Frequency of Communication with Friends and Family: Not on file  . Frequency of Social Gatherings with Friends and Family: Not on file  . Attends Religious Services: Not on file  . Active Member of Clubs or Organizations: Not on file  . Attends Archivist Meetings: Not on file  . Marital Status: Not on file  Intimate Partner Violence:   . Fear of Current or Ex-Partner: Not on file  . Emotionally Abused: Not on file  . Physically Abused: Not on file  . Sexually Abused: Not on file    Family History:  Family History  Problem Relation Age of Onset  . Esophageal cancer Father   . Hypertension Mother   . Colon polyps Neg Hx   . Colon cancer Neg Hx   . Rectal cancer Neg Hx   . Stomach cancer Neg Hx      ROS:  Please see the history of present illness.   All other ROS reviewed and negative.     Physical Exam/Data:   Vitals:   01/23/20 0600 01/23/20 0700 01/23/20 0802 01/23/20 1138  BP: (!) 102/53 93/60 (!) 96/58 (!) 119/52  Pulse: (!) 107 68 (!) 128 78  Resp:   15 16  Temp:   98 F (36.7 C) 98 F (36.7 C)  TempSrc:   Oral Oral  SpO2: 92% 94% 95%   Weight:      Height:        Intake/Output Summary (Last 24 hours) at 01/23/2020 1408 Last data filed at 01/23/2020 0600 Gross per 24 hour  Intake 68.66 ml  Output --  Net 68.66 ml   Last 3 Weights 01/23/2020 01/22/2020 05/24/2018  Weight (lbs) 204 lb 11.2 oz 206 lb 217 lb  Weight (kg) 92.851 kg 93.441 kg 98.431 kg     Body mass index is 34.06 kg/m.  General:  Well nourished, well developed, in no acute distress HEENT: normal Lymph: no adenopathy Neck: no JVD Endocrine:  No thryomegaly Vascular: No carotid bruits; FA pulses 2+ bilaterally without bruits  Cardiac:  normal S1, S2; RRR; 3/6 systolic murmur Lungs:  clear to auscultation bilaterally, no wheezing, rhonchi or rales  Abd: soft, nontender, no hepatomegaly  Ext: no  edema Musculoskeletal:  No deformities, BUE and BLE strength normal and equal Skin: warm and dry  Neuro:  CNs 2-12 intact, no focal abnormalities noted Psych:  Normal affect   EKG:  The EKG was personally reviewed and demonstrates:  Atrial flutter ventricular rate 128 Telemetry:  Telemetry was personally reviewed and demonstrates: atrial flutter 120s converted to NSR in the 80s at approximately 1018 today  Relevant CV Studies:  Echo 01/23/20: 1. Left ventricular ejection fraction, by estimation, is 60 to 65%. The  left ventricle has normal function. The left ventricle has no regional  wall motion abnormalities. Left ventricular diastolic function could not  be evaluated.  2. Right ventricular systolic function is normal. The right ventricular  size is normal.  3. Left atrial size was moderately dilated.  4. The trivial pericardial effusion is posterior to the left ventricle.  5. The mitral valve is abnormal. Mild mitral valve regurgitation.  6. The aortic valve is tricuspid. There is moderate calcification of the  aortic valve. Aortic valve regurgitation is not visualized. Mild aortic  valve stenosis. Aortic valve area, by VTI measures 1.59 cm. Aortic valve  mean gradient measures 11.0 mmHg.  Aortic valve Vmax measures 2.15 m/s.  7. The inferior vena cava is normal in size with <50% respiratory  variability, suggesting right atrial pressure of 8 mmHg.   Laboratory Data:  High Sensitivity Troponin:   Recent Labs  Lab 01/22/20 1343 01/22/20 1644  TROPONINIHS 8 7     Chemistry Recent Labs  Lab 01/22/20 1343  NA 139  K 3.8  CL 103  CO2 26  GLUCOSE 122*  BUN 13  CREATININE 0.45  CALCIUM 8.8*  GFRNONAA >60  ANIONGAP 10    Recent Labs  Lab 01/22/20 2218  PROT 7.8  ALBUMIN 3.5  AST 34  ALT 18  ALKPHOS 76  BILITOT 1.6*  Hematology Recent Labs  Lab 01/22/20 1343 01/23/20 0222  WBC 25.9* 16.5*  RBC 4.38 4.11  HGB 11.6* 10.8*  HCT 37.8 34.7*  MCV  86.3 84.4  MCH 26.5 26.3  MCHC 30.7 31.1  RDW 21.7* 21.9*  PLT 190 143*   BNP Recent Labs  Lab 01/22/20 1649  BNP 479.7*    DDimer  Recent Labs  Lab 01/22/20 1653  DDIMER 2.01*     Radiology/Studies:  DG Chest 2 View  Result Date: 01/22/2020 CLINICAL DATA:  Shortness of breath. Additional history provided: Shortness of breath, weakness, lightheadedness, mid chest discomfort radiating to right side since Saturday, history of heart murmur and asthma. EXAM: CHEST - 2 VIEW COMPARISON:  No pertinent prior exams are available for comparison. FINDINGS: Heart size within normal limits. Aortic atherosclerosis. No appreciable airspace consolidation or pulmonary edema. No evidence of pleural effusion or pneumothorax. No acute bony abnormality identified. Chronic deformity of the proximal right humerus. IMPRESSION: No evidence of acute cardiopulmonary abnormality. Aortic Atherosclerosis (ICD10-I70.0). Electronically Signed   By: Kellie Simmering DO   On: 01/22/2020 14:16   CT Angio Chest PE W/Cm &/Or Wo Cm  Result Date: 01/22/2020 CLINICAL DATA:  Chest pain and shortness of breath for 2 days EXAM: CT ANGIOGRAPHY CHEST WITH CONTRAST TECHNIQUE: Multidetector CT imaging of the chest was performed using the standard protocol during bolus administration of intravenous contrast. Multiplanar CT image reconstructions and MIPs were obtained to evaluate the vascular anatomy. CONTRAST:  77mL OMNIPAQUE IOHEXOL 350 MG/ML SOLN COMPARISON:  Chest x-ray from earlier in the same day. FINDINGS: Cardiovascular: Thoracic aorta shows atherosclerotic calcifications without aneurysmal dilatation or dissection. Mild cardiac enlargement is noted. Coronary calcifications are seen. Pulmonary artery shows a normal branching pattern. No filling defect to suggest pulmonary embolism is noted. Mediastinum/Nodes: Thoracic inlet is within normal limits. Scattered small hilar and mediastinal lymph nodes are noted. No sizable adenopathy is  noted. The esophagus as visualized is within normal limits. Lungs/Pleura: Lungs are well aerated bilaterally and demonstrate some mild air trapping. No focal confluent infiltrate is seen. No sizable effusion or pneumothorax is noted. Upper Abdomen: Spleen is enlarged. Mild changes of cirrhosis of the liver are seen. Findings of portal hypertension are noted with recanalization of the umbilical vein and collateral flow in the anterior abdominal wall. Musculoskeletal: No chest wall abnormality. No acute or significant osseous findings. Review of the MIP images confirms the above findings. IMPRESSION: No evidence of pulmonary emboli. 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 Air trapping is seen bilaterally without focal infiltrate. Electronically Signed   By: Inez Catalina M.D.   On: 01/22/2020 19:24   ECHOCARDIOGRAM COMPLETE  Result Date: 01/23/2020    ECHOCARDIOGRAM REPORT   Patient Name:   Fairbanks Homesley Date of Exam: 01/23/2020 Medical Rec #:  [id number]    Height:       65.0 in Accession #:    [id number]   Weight:       204.7 lb Date of Birth:  1945/11/03   BSA:          1.998 m Patient Age:    48 years     BP:           102/53 mmHg Patient Gender: F            HR:           114 bpm. Exam Location:  Inpatient Procedure: 2D Echo, Cardiac Doppler, Color Doppler and Intracardiac  Opacification Agent Indications:    I48.92* Unspecified atrial flutter  History:        Patient has no prior history of Echocardiogram examinations.                 Signs/Symptoms:Murmur; Risk Factors:Dyslipidemia and GERD.  Sonographer:    Jonelle Sidle Dance Referring Phys: [id number] [place]  1. Left ventricular ejection fraction, by estimation, is 60 to 65%. The left ventricle has normal function. The left ventricle has no regional wall motion abnormalities. Left ventricular diastolic function could not be evaluated.  2. Right ventricular systolic function is normal. The  right ventricular size is normal.  3. Left atrial size was moderately dilated.  4. The trivial pericardial effusion is posterior to the left ventricle.  5. The mitral valve is abnormal. Mild mitral valve regurgitation.  6. The aortic valve is tricuspid. There is moderate calcification of the aortic valve. Aortic valve regurgitation is not visualized. Mild aortic valve stenosis. Aortic valve area, by VTI measures 1.59 cm. Aortic valve mean gradient measures 11.0 mmHg. Aortic valve Vmax measures 2.15 m/s.  7. The inferior vena cava is normal in size with <50% respiratory variability, suggesting right atrial pressure of 8 mmHg. Comparison(s): No prior Echocardiogram. FINDINGS  Left Ventricle: Left ventricular ejection fraction, by estimation, is 60 to 65%. The left ventricle has normal function. The left ventricle has no regional wall motion abnormalities. Definity contrast agent was given IV to delineate the left ventricular  endocardial borders. The left ventricular internal cavity size was normal in size. There is borderline left ventricular hypertrophy. Left ventricular diastolic function could not be evaluated due to atrial flutter. Left ventricular diastolic function could not be evaluated. Right Ventricle: The right ventricular size is normal. No increase in right ventricular wall thickness. Right ventricular systolic function is normal. Left Atrium: Left atrial size was moderately dilated. Right Atrium: Right atrial size was normal in size. Pericardium: Trivial pericardial effusion is present. The pericardial effusion is posterior to the left ventricle. Mitral Valve: The mitral valve is abnormal. There is mild thickening of the mitral valve leaflet(s). Mild to moderate mitral annular calcification. Mild mitral valve regurgitation. Tricuspid Valve: The tricuspid valve is grossly normal. Tricuspid valve regurgitation is mild. Aortic Valve: The aortic valve is tricuspid. There is moderate calcification of the  aortic valve. Aortic valve regurgitation is not visualized. Mild aortic stenosis is present. Aortic valve mean gradient measures 11.0 mmHg. Aortic valve peak gradient measures 18.5 mmHg. Aortic valve area, by VTI measures 1.59 cm. Pulmonic Valve: The pulmonic valve was not well visualized. Pulmonic valve regurgitation is trivial. Aorta: The aortic root and ascending aorta are structurally normal, with no evidence of dilitation. Venous: The inferior vena cava is normal in size with less than 50% respiratory variability, suggesting right atrial pressure of 8 mmHg. IAS/Shunts: No atrial level shunt detected by color flow Doppler.  LEFT VENTRICLE PLAX 2D LVIDd:         4.70 cm LVIDs:         3.00 cm LV PW:         1.20 cm LV IVS:        0.90 cm LVOT diam:     2.00 cm LV SV:         66 LV SV Index:   33 LVOT Area:     3.14 cm  RIGHT VENTRICLE          IVC RV Basal diam:  3.70 cm  IVC diam:  2.00 cm RV Mid diam:    2.20 cm TAPSE (M-mode): 2.1 cm LEFT ATRIUM             Index       RIGHT ATRIUM           Index LA diam:        4.20 cm 2.10 cm/m  RA Area:     22.10 cm LA Vol (A2C):   76.5 ml 38.29 ml/m RA Volume:   65.60 ml  32.84 ml/m LA Vol (A4C):   79.4 ml 39.74 ml/m LA Biplane Vol: 78.8 ml 39.44 ml/m  AORTIC VALVE AV Area (Vmax):    1.52 cm AV Area (Vmean):   1.45 cm AV Area (VTI):     1.59 cm AV Vmax:           215.00 cm/s AV Vmean:          151.667 cm/s AV VTI:            0.414 m AV Peak Grad:      18.5 mmHg AV Mean Grad:      11.0 mmHg LVOT Vmax:         104.00 cm/s LVOT Vmean:        70.000 cm/s LVOT VTI:          0.209 m LVOT/AV VTI ratio: 0.50  AORTA Ao Root diam: 2.90 cm Ao Asc diam:  2.80 cm MITRAL VALVE MV Area (PHT): 3.60 cm     SHUNTS MV Decel Time: 211 msec     Systemic VTI:  0.21 m MV E velocity: 110.50 cm/s  Systemic Diam: 2.00 cm Lyman Bishop MD Electronically signed by Lyman Bishop MD Signature Date/Time: 01/23/2020/11:04:47 AM    Final      Assessment and Plan:   Atrial flutter with  RVR Hx of PAF Need for chronic anticoagulation Hx of thrombocytopenia - pt presented in atrial flutter RVR - has a history of PAF - has not been on anticoagulation due to thrombocytopenia This patients CHA2DS2-VASc Score and unadjusted Ischemic Stroke Rate (% per year) is equal to 4.8 % stroke rate/year from a score of 4 (age, female, HTN, aortic atherosclerosis). - would favor anticoagulation - will start 5 mg eliquis BID - PLT 143, Hb 10.8 - would like to monitor on eliquis prior to discharge - she came to [facility] to establish with EP - Dr. Rayann Heman - to have an ablation - I will transition to 12 hr cardizem 90 mg BID to make sure she tolerates PO dosing - I will refer to Dr. Rayann Heman and Afib clinic for consideration of possible ablation   Hypertension - is on HCTZ at home - hold this and watch pressure in the setting of CCB - she states the HCTZ controls leg swelling after knee surgery   Hyperlipidemia with LDL goal < 70 Aortic atherosclerosis Chest pressure with RVR - continue 40 mg zocor 01/23/2020: Cholesterol 125; HDL 44; LDL Cholesterol 53; Triglycerides 142; VLDL 28 - she reports chest pressure with RVR, resolved once she was better rate controlled - pt reports history of stress test - have not seen these results - hs troponin x 2 negative   Elevated BNP - BNP 480 - question a component of diastolic dysfunction - she appears euvolemic - CXR negative for CHF changes - echo with normal EF   Elevated D-dimer - D-dime 2 - CTA negative for PE   Cirrhosis  - LFTs WNL - INR 1.3  CHA2DS2-VASc Score = 4  This indicates a 4.8% annual risk of stroke. The patient's score is based upon: CHF History: 0 HTN History: 1 Diabetes History: 0 Stroke History: 0 Vascular Disease History: 1 Age Score: 1 Gender Score: 1     For questions or updates, please contact [place] Please consult [email or web address] for contact info under    Signed, Ledora Bottcher, [place]   01/23/2020 2:08 PM  Patient seen and examined.  Agree with above documentation.  Ms. Bribiesca is a 74 year old female with a history of paroxysmal atrial fibrillation, cirrhosis, hyperlipidemia who we are consulted by Dr. Evette Doffing for evaluation of atrial flutter with RVR.  She reports that she was diagnosed with COVID-19 in 12/2018.  While recovering from this, she had episode of A. fib.  Spontaneously converted to sinus rhythm.  States that in January 2021 she had a second episode of atrial fibrillation that occurred after her first dose of the Moderna vaccine.  She was seen by cardiologist in [place] and started on metoprolol.  States that she was unable to tolerate metoprolol so has not been taking.  She has not been on anticoagulation.  States that she woke up Saturday morning and she was back in atrial fibrillation.  Checked her pulse and was up to 170s.  She still had her prescription for metoprolol and she took metoprolol over the weekend with improvement in heart rate to 130s to 140s.  Reports feeling short of breath and mild chest pain while in A. fib.  She presented to the ED yesterday.  On presentation, vitals notable for BP 145/84, pulse 124, SPO2 97% on room air.  Labs notable for creatinine 0.45, sodium 139, potassium 3.8, hemoglobin 11.6, WBC 25.9, high-sensitivity troponin 8 >7, BNP 480, AST 34, ALT 18, T bili 1.6, LDL 142.  EKG yesterday shows Aflutter with variable conduction, rate 128.  CTPA showed no evidence of PE, did show liver cirrhosis. Echocardiogram showed normal biventricular function, mild MR, mild AS.  She was started on diltiazem gtt for rate control, converted to normal sinus rhythm today.  On exam, patient is alert and oriented, regular rate and rhythm, no murmurs, lungs CTAB, no LE edema or JVD.  For her atrial fibrillation/flutter, will switch from diltiazem gtt to PO diltiazem.  Will start Eliquis 5 mg BID given CHADS-VASC 4.  She has history of cirrhosis, but  platelets 143 and denies any h/o esophageal varices or GI bleeding.  She expressed that she wishes to f/u with EP as would like to discuss ablation, will schedule outpatient f/u.  Donato Heinz, MD

## 2020-01-23 NOTE — Progress Notes (Signed)
Notified by CCMD that patient had converted to NSR - 12 lead EKG obtained and rhythm verified. Paged MD to make aware.

## 2020-01-23 NOTE — Progress Notes (Signed)
Transitions of Care Pharmacist Note  Savannah Hobbs is a 74 y.o. female that has been diagnosed with A Fib and will be prescribed Eliquis (apixaban) at discharge.   Patient Education: I provided the following education on apixaban to the patient: How to take the medication Described what the medication is Signs of bleeding Signs/symptoms of VTE and stroke  Answered their questions  Discharge Medications Plan: The patient wants to have their discharge medications filled by the Transitions of Care pharmacy rather than their usual pharmacy.  The discharge orders pharmacy has been changed to the Transitions of Care pharmacy, the patient will receive a phone call regarding co-pay, and their medications will be delivered by the Transitions of Care pharmacy.    Thank you,   Romilda Garret, PharmD PGY1 Acute Care Pharmacy Resident 01/23/2020 5:20 PM  Please check [email or web address] for unit specific pharmacy phone numbers.

## 2020-01-23 NOTE — Progress Notes (Signed)
   Subjective:  Patient evaluated at bedside this AM. Patient endorses presenting with palpitations and dyspnea with intermittent chest pressure that has resolved. She notes feeling better this morning. She has a cardiologist but wants to be referred elsewhere. She has history of PAF which she notes was well controlled until recent COVID infection 1 year ago which triggered more frequent Afib with RVR episodes lasting for approximately one hour at a time. However, since Saturday she has had persistent tachycardia with HR in 130-150s. Discussed recommendation for full dose anticoagulation with higher risk of stroke vs risk of bleeding. In case of cardioversion, would recommend for full dose anticoagulation. Will follow up with cardiology.   Objective:  Vital signs in last 24 hours: Vitals:   01/23/20 0300 01/23/20 0400 01/23/20 0512 01/23/20 0600  BP: 105/62 (!) 103/53 (!) 109/54 (!) 102/53  Pulse: (!) 112 (!) 110 (!) 107 (!) 107  Resp:      Temp:  98.1 F (36.7 C)    TempSrc:  Oral    SpO2: 96% 94% 94% 92%  Weight:      Height:       Physical Exam: General: Pleasant, no acute distress CV: Tachycardic, irregular rhythm. Systolic murmur present. Abdomen: Soft, umbilical hernia present Extremities: Trace BLE edema Neuro: Awake, alert, oriented x3.  Assessment/Plan: Savannah Hobbs is 74yo female with paroxysmal A fib, cirrhosis, hyperlipidemia, arthritis, GERD admitted 11/1 with atrial flutter with RVR.  Principal Problem:   Atrial flutter with rapid ventricular response (HCC)  #Atrial flutter w/ RVR CHADS-VASc 2. Patient is hesitant regarding anticoagulation due to cirrhosis, thrombocytopenia. Patient has history of Afib, previously on metoprolol. On arrival, D-dimer elevated, CTA neg. Started on Cardizem drip in ED. This morning, continues to be tachycardic to 130's. Discussed with patient starting anticoagulation, appears to be open to low-dose. Also mentioned possibility of cardioversion  for Afib. Plan to consult cardiology, obtaining TTE this AM. 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 First dx 03/2016. Patient denies any bleeding history, no prior EGD. On arrival, CTA showed cirrhotic liver changes with portal hypertension. PLT 190, INR 1.3. Given her cirrhosis appears to be compensated, low likelihood for bleeding with anticoagulation (HAS-BLED 1).  - F/u CBC  #Leukocytosis WBC 25.9 > 16. Patient afebrile, no signs of infection. Will continue to monitor vitals and CBC for signs of infection.    DIET: Regular IVF: n/a DVT PPX: Lovenox BOWEL: n/a CODE: FULL  Prior to Admission Living Arrangement: Home Anticipated Discharge Location: Home Barriers to Discharge: medical management of atrial flutter Dispo: Anticipated discharge in approximately 2-3 day(s).   Savannah Dame, MD 01/23/2020, 6:43 AM Pager: [phone] After 5pm on weekdays and 1pm on weekends: On Call pager [phone]

## 2020-01-23 NOTE — Progress Notes (Signed)
Pharmacy Note:  Pt has h/o thrombocytopenia and concerns about lovenox.  I discussed with her over phone full dose vs DVT px dose.  We will monitor PTLC closely. Excell Seltzer, PharmD

## 2020-01-24 ENCOUNTER — Other Ambulatory Visit: Payer: Self-pay | Admitting: Medical

## 2020-01-24 DIAGNOSIS — I4892 Unspecified atrial flutter: Secondary | ICD-10-CM

## 2020-01-24 DIAGNOSIS — I10 Essential (primary) hypertension: Secondary | ICD-10-CM | POA: Diagnosis not present

## 2020-01-24 DIAGNOSIS — R079 Chest pain, unspecified: Secondary | ICD-10-CM

## 2020-01-24 LAB — CBC
HCT: 32.7 % — ABNORMAL LOW (ref 36.0–46.0)
Hemoglobin: 10.3 g/dL — ABNORMAL LOW (ref 12.0–15.0)
MCH: 26.8 pg (ref 26.0–34.0)
MCHC: 31.5 g/dL (ref 30.0–36.0)
MCV: 84.9 fL (ref 80.0–100.0)
Platelets: 127 10*3/uL — ABNORMAL LOW (ref 150–400)
RBC: 3.85 MIL/uL — ABNORMAL LOW (ref 3.87–5.11)
RDW: 22 % — ABNORMAL HIGH (ref 11.5–15.5)
WBC: 14.3 10*3/uL — ABNORMAL HIGH (ref 4.0–10.5)
nRBC: 0 % (ref 0.0–0.2)

## 2020-01-24 LAB — BASIC METABOLIC PANEL
Anion gap: 9 (ref 5–15)
BUN: 18 mg/dL (ref 8–23)
CO2: 26 mmol/L (ref 22–32)
Calcium: 9 mg/dL (ref 8.9–10.3)
Chloride: 102 mmol/L (ref 98–111)
Creatinine, Ser: 0.48 mg/dL (ref 0.44–1.00)
GFR, Estimated: >60 mL/min (ref >60)
Glucose, Bld: 120 mg/dL — ABNORMAL HIGH (ref 70–99)
Potassium: 4 mmol/L (ref 3.5–5.1)
Sodium: 137 mmol/L (ref 135–145)

## 2020-01-24 MED ORDER — FUROSEMIDE 20 MG PO TABS: 20.0000 mg | ORAL_TABLET | Freq: Every day | ORAL | 0 refills | Status: DC | PRN

## 2020-01-24 MED ORDER — DILTIAZEM HCL ER 90 MG PO CP12: 90.0000 mg | ORAL_CAPSULE | Freq: Two times a day (BID) | ORAL | 0 refills | Status: DC

## 2020-01-24 MED ORDER — APIXABAN 5 MG PO TABS: 5.0000 mg | ORAL_TABLET | Freq: Two times a day (BID) | ORAL | 0 refills | Status: DC

## 2020-01-24 MED ORDER — DILTIAZEM HCL ER COATED BEADS 180 MG PO CP24: 180.0000 mg | ORAL_CAPSULE | Freq: Every day | ORAL | 6 refills | Status: DC

## 2020-01-24 MED FILL — FUROSEMIDE 20 MG TAB: 20 | 30 days supply | Qty: 30 | Fill #0

## 2020-01-24 MED FILL — ELIQUIS 5 MG TABLET: 5 | 30 days supply | Qty: 60 | Fill #0

## 2020-01-24 MED FILL — CARTIA XT 180 MG CAPSULE SA: 180 | 30 days supply | Qty: 30 | Fill #0

## 2020-01-24 NOTE — Progress Notes (Addendum)
Progress Note  Patient Name: Savannah Hobbs Date of Encounter: 01/24/2020  [facility] Cardiologist: Donato Heinz, MD   Subjective   She reported an episode of indigestion overnight which improved with belching. No recurrent palpitations, SOB, dizziness, or lightheadedness.   Inpatient Medications    Scheduled Meds: . apixaban  5 mg Oral BID  . diltiazem  90 mg Oral Q12H  . influenza vaccine adjuvanted  0.5 mL Intramuscular Tomorrow-1000  . pantoprazole  40 mg Oral Daily  . simvastatin  20 mg Oral Daily   Continuous Infusions:  PRN Meds: acetaminophen, ondansetron (ZOFRAN) IV   Vital Signs    Vitals:   01/23/20 1138 01/23/20 1731 01/23/20 2107 01/24/20 0018  BP: (!) 119/52 (!) 130/51 (!) 116/50 113/71  Pulse: 78 80 76   Resp: 16 19 18    Temp: 98 F (36.7 C) 98.1 F (36.7 C) 98.1 F (36.7 C)   TempSrc: Oral Oral Oral   SpO2:   97%   Weight:      Height:       No intake or output data in the 24 hours ending 01/24/20 0703 Last 3 Weights 01/23/2020 01/22/2020 05/24/2018  Weight (lbs) 204 lb 11.2 oz 206 lb 217 lb  Weight (kg) 92.851 kg 93.441 kg 98.431 kg      Telemetry    Sinus rhythm with no recurrent atrial flutter - Personally Reviewed  ECG    Sinus rhythm with rate 79 bpm, no TWI, no STE/D - Personally Reviewed  Physical Exam   GEN: No acute distress.   Neck: No JVD Cardiac: RRR, +murmurs, no rubs or gallops.  Respiratory: Clear to auscultation bilaterally. GI: Soft, nontender, non-distended  MS: trace LE edema; No deformity. Neuro:  Nonfocal  Psych: Normal affect   Labs    High Sensitivity Troponin:   Recent Labs  Lab 01/22/20 1343 01/22/20 1644  TROPONINIHS 8 7      Chemistry Recent Labs  Lab 01/22/20 1343 01/22/20 2218 01/24/20 0144  NA 139  --  137  K 3.8  --  4.0  CL 103  --  102  CO2 26  --  26  GLUCOSE 122*  --  120*  BUN 13  --  18  CREATININE 0.45  --  0.48  CALCIUM 8.8*  --  9.0  PROT  --  7.8  --   ALBUMIN   --  3.5  --   AST  --  34  --   ALT  --  18  --   ALKPHOS  --  76  --   BILITOT  --  1.6*  --   GFRNONAA >60  --  >60  ANIONGAP 10  --  9     Hematology Recent Labs  Lab 01/22/20 1343 01/23/20 0222 01/24/20 0144  WBC 25.9* 16.5* 14.3*  RBC 4.38 4.11 3.85*  HGB 11.6* 10.8* 10.3*  HCT 37.8 34.7* 32.7*  MCV 86.3 84.4 84.9  MCH 26.5 26.3 26.8  MCHC 30.7 31.1 31.5  RDW 21.7* 21.9* 22.0*  PLT 190 143* 127*    BNP Recent Labs  Lab 01/22/20 1649  BNP 479.7*     DDimer  Recent Labs  Lab 01/22/20 1653  DDIMER 2.01*     Radiology    DG Chest 2 View  Result Date: 01/22/2020 CLINICAL DATA:  Shortness of breath. Additional history provided: Shortness of breath, weakness, lightheadedness, mid chest discomfort radiating to right side since Saturday, history of heart murmur and  asthma. EXAM: CHEST - 2 VIEW COMPARISON:  No pertinent prior exams are available for comparison. FINDINGS: Heart size within normal limits. Aortic atherosclerosis. No appreciable airspace consolidation or pulmonary edema. No evidence of pleural effusion or pneumothorax. No acute bony abnormality identified. Chronic deformity of the proximal right humerus. IMPRESSION: No evidence of acute cardiopulmonary abnormality. Aortic Atherosclerosis (ICD10-I70.0). Electronically Signed   By: Kellie Simmering DO   On: 01/22/2020 14:16   CT Angio Chest PE W/Cm &/Or Wo Cm  Result Date: 01/22/2020 CLINICAL DATA:  Chest pain and shortness of breath for 2 days EXAM: CT ANGIOGRAPHY CHEST WITH CONTRAST TECHNIQUE: Multidetector CT imaging of the chest was performed using the standard protocol during bolus administration of intravenous contrast. Multiplanar CT image reconstructions and MIPs were obtained to evaluate the vascular anatomy. CONTRAST:  78mL OMNIPAQUE IOHEXOL 350 MG/ML SOLN COMPARISON:  Chest x-ray from earlier in the same day. FINDINGS: Cardiovascular: Thoracic aorta shows atherosclerotic calcifications without aneurysmal  dilatation or dissection. Mild cardiac enlargement is noted. Coronary calcifications are seen. Pulmonary artery shows a normal branching pattern. No filling defect to suggest pulmonary embolism is noted. Mediastinum/Nodes: Thoracic inlet is within normal limits. Scattered small hilar and mediastinal lymph nodes are noted. No sizable adenopathy is noted. The esophagus as visualized is within normal limits. Lungs/Pleura: Lungs are well aerated bilaterally and demonstrate some mild air trapping. No focal confluent infiltrate is seen. No sizable effusion or pneumothorax is noted. Upper Abdomen: Spleen is enlarged. Mild changes of cirrhosis of the liver are seen. Findings of portal hypertension are noted with recanalization of the umbilical vein and collateral flow in the anterior abdominal wall. Musculoskeletal: No chest wall abnormality. No acute or significant osseous findings. Review of the MIP images confirms the above findings. IMPRESSION: No evidence of pulmonary emboli. 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 Air trapping is seen bilaterally without focal infiltrate. Electronically Signed   By: Inez Catalina M.D.   On: 01/22/2020 19:24   ECHOCARDIOGRAM COMPLETE  Result Date: 01/23/2020    ECHOCARDIOGRAM REPORT   Patient Name:   [facility] Date of Exam: 01/23/2020 Medical Rec #:  [id number]    Height:       65.0 in Accession #:    [id number]   Weight:       204.7 lb Date of Birth:  [birth year]   BSA:          1.998 m Patient Age:    74 years     BP:           102/53 mmHg Patient Gender: F            HR:           114 bpm. Exam Location:  Inpatient Procedure: 2D Echo, Cardiac Doppler, Color Doppler and Intracardiac            Opacification Agent Indications:    I48.92* Unspecified atrial flutter  History:        Patient has no prior history of Echocardiogram examinations.                 Signs/Symptoms:Murmur; Risk Factors:Dyslipidemia and GERD.  Sonographer:    Jonelle Sidle Dance  Referring Phys: [id number] [place]  1. Left ventricular ejection fraction, by estimation, is 60 to 65%. The left ventricle has normal function. The left ventricle has no regional wall motion abnormalities. Left ventricular diastolic function could not be evaluated.  2. Right ventricular systolic function is normal.  The right ventricular size is normal.  3. Left atrial size was moderately dilated.  4. The trivial pericardial effusion is posterior to the left ventricle.  5. The mitral valve is abnormal. Mild mitral valve regurgitation.  6. The aortic valve is tricuspid. There is moderate calcification of the aortic valve. Aortic valve regurgitation is not visualized. Mild aortic valve stenosis. Aortic valve area, by VTI measures 1.59 cm. Aortic valve mean gradient measures 11.0 mmHg. Aortic valve Vmax measures 2.15 m/s.  7. The inferior vena cava is normal in size with <50% respiratory variability, suggesting right atrial pressure of 8 mmHg. Comparison(s): No prior Echocardiogram. FINDINGS  Left Ventricle: Left ventricular ejection fraction, by estimation, is 60 to 65%. The left ventricle has normal function. The left ventricle has no regional wall motion abnormalities. Definity contrast agent was given IV to delineate the left ventricular  endocardial borders. The left ventricular internal cavity size was normal in size. There is borderline left ventricular hypertrophy. Left ventricular diastolic function could not be evaluated due to atrial flutter. Left ventricular diastolic function could not be evaluated. Right Ventricle: The right ventricular size is normal. No increase in right ventricular wall thickness. Right ventricular systolic function is normal. Left Atrium: Left atrial size was moderately dilated. Right Atrium: Right atrial size was normal in size. Pericardium: Trivial pericardial effusion is present. The pericardial effusion is posterior to the left ventricle. Mitral Valve: The  mitral valve is abnormal. There is mild thickening of the mitral valve leaflet(s). Mild to moderate mitral annular calcification. Mild mitral valve regurgitation. Tricuspid Valve: The tricuspid valve is grossly normal. Tricuspid valve regurgitation is mild. Aortic Valve: The aortic valve is tricuspid. There is moderate calcification of the aortic valve. Aortic valve regurgitation is not visualized. Mild aortic stenosis is present. Aortic valve mean gradient measures 11.0 mmHg. Aortic valve peak gradient measures 18.5 mmHg. Aortic valve area, by VTI measures 1.59 cm. Pulmonic Valve: The pulmonic valve was not well visualized. Pulmonic valve regurgitation is trivial. Aorta: The aortic root and ascending aorta are structurally normal, with no evidence of dilitation. Venous: The inferior vena cava is normal in size with less than 50% respiratory variability, suggesting right atrial pressure of 8 mmHg. IAS/Shunts: No atrial level shunt detected by color flow Doppler.  LEFT VENTRICLE PLAX 2D LVIDd:         4.70 cm LVIDs:         3.00 cm LV PW:         1.20 cm LV IVS:        0.90 cm LVOT diam:     2.00 cm LV SV:         66 LV SV Index:   33 LVOT Area:     3.14 cm  RIGHT VENTRICLE          IVC RV Basal diam:  3.70 cm  IVC diam: 2.00 cm RV Mid diam:    2.20 cm TAPSE (M-mode): 2.1 cm LEFT ATRIUM             Index       RIGHT ATRIUM           Index LA diam:        4.20 cm 2.10 cm/m  RA Area:     22.10 cm LA Vol (A2C):   76.5 ml 38.29 ml/m RA Volume:   65.60 ml  32.84 ml/m LA Vol (A4C):   79.4 ml 39.74 ml/m LA Biplane Vol: 78.8 ml 39.44 ml/m  AORTIC  VALVE AV Area (Vmax):    1.52 cm AV Area (Vmean):   1.45 cm AV Area (VTI):     1.59 cm AV Vmax:           215.00 cm/s AV Vmean:          151.667 cm/s AV VTI:            0.414 m AV Peak Grad:      18.5 mmHg AV Mean Grad:      11.0 mmHg LVOT Vmax:         104.00 cm/s LVOT Vmean:        70.000 cm/s LVOT VTI:          0.209 m LVOT/AV VTI ratio: 0.50  AORTA Ao Root diam: 2.90  cm Ao Asc diam:  2.80 cm MITRAL VALVE MV Area (PHT): 3.60 cm     SHUNTS MV Decel Time: 211 msec     Systemic VTI:  0.21 m MV E velocity: 110.50 cm/s  Systemic Diam: 2.00 cm Lyman Bishop MD Electronically signed by Lyman Bishop MD Signature Date/Time: 01/23/2020/11:04:47 AM    Final     Cardiac Studies   Echo 01/23/20: 1. Left ventricular ejection fraction, by estimation, is 60 to 65%. The  left ventricle has normal function. The left ventricle has no regional  wall motion abnormalities. Left ventricular diastolic function could not  be evaluated.  2. Right ventricular systolic function is normal. The right ventricular  size is normal.  3. Left atrial size was moderately dilated.  4. The trivial pericardial effusion is posterior to the left ventricle.  5. The mitral valve is abnormal. Mild mitral valve regurgitation.  6. The aortic valve is tricuspid. There is moderate calcification of the  aortic valve. Aortic valve regurgitation is not visualized. Mild aortic  valve stenosis. Aortic valve area, by VTI measures 1.59 cm. Aortic valve  mean gradient measures 11.0 mmHg.  Aortic valve Vmax measures 2.15 m/s.  7. The inferior vena cava is normal in size with <50% respiratory  variability, suggesting right atrial pressure of 8 mmHg.   Patient Profile     74 y.o. female with a PMH of paroxysmal atrial fibrillation (not on anticoagulation), HTN, HLD, thrombocytopenia, fatty liver with cirrhosis, and GERD, who is being followed by cardiology for the evaluate of atrial flutter with RVR.   Assessment & Plan    1. Paroxysmal atrial fibrillation/flutter: no recurrent episodes overnight. She was started on eliquis 5mg  BID yesterday for CHA2DS2-VASc Score of 4 given stable PLT count. Echo this admission with EF 60-65%, indeterminate LV diastolic function, no RWMA, moderate LAE, mild MR, and mild AS. - Continue diltiazem 90mg  BID for rate control - Continue eliquis 5mg  BID for stroke ppx -  Will arrange follow-up with Dr. Rayann Heman for outpatient EP evaluation  2. HTN: BP well controlled this admission. Home HCTZ held (she takes this for LE edema following knee surgery) - Continue diltiazem for #1 - Would be reasonable to hold HCTZ at discharge given low BP's - could consider discharging home on prn lasix for LE edema  3. Chest pain in patient with coronary artery calcifications on CT: CP resolved with restoration of sinus rhythm. HsTrop negative this admission. Echo with EF 60-65% and no RWMA. CTA chest negative for PE, though did show coronary artery calcifications and aortic atherosclerosis. Pt reports prior stress test, though unable to review these results. - Would be reasonable to update an ischemic evaluation outpatient - plan for outpatient  Lexiscan Myoview - Continue aggressive risk factor modifications - BP goal <130/80, LDL goal <70, A1C goal <7  4. HLD: LDL 53 this admission - Continue simvastatin  5. Valvulopathy: echo this admission showed mild MR and mild MS - Continue routine outpatient surveillance echos  [facility] will sign off.   Medication Recommendations:  Eliquis 5 mg BID, diltiazem 90mg  q12hr Other recommendations (labs, testing, etc):  Will schedule outpatient Lexiscan Myoview to evaluate for ischemia Follow up as an outpatient:  Schedule general cardiology f/u 12/6.  Scheduled EP f/u with Dr Rayann Heman and 11/11 to discuss ablation    For questions or updates, please contact [facility] Please consult [email or web address] for contact info under        Signed, Abigail Butts, PA-C  01/24/2020, 7:03 AM    Patient seen and examined.  Agree with above documentation.  On exam, patient is alert and oriented, regular rate and rhythm, 2/6 systolic murmur, lungs CTAB, no LE edema or JVD.  Telemetry shows sinus rhythm.  Planning for discharge today.  Would discharge on diltiazem for rate control and Eliquis for anticoagulation, with plans to see Dr Rayann Heman on  11/11 to discuss ablation.  She reported atypical chest pain, will order outpatient Lexiscan Myoview.  General cardiology f/u scheduled for 12/6.  Donato Heinz, MD

## 2020-01-24 NOTE — Progress Notes (Signed)
   Subjective:  Patient evaluated at bedside this AM. She is in good spirits. She was able to discuss her medications with cardiology and plans to follow up in the atrial fibrillation clinic with Dr. Rayann Heman. Does endorse having mild left sided chest pain overnight but EKG and vitals were stable. She attributes this to gas as she takes Nexium at home and protonix does not work for her.   She does have compression stockings but only intermediately using them. Recommend using these more consistently.  Recommend getting sleep study; follow up with PCP for CBC to check on platelets.   Objective:  Vital signs in last 24 hours: Vitals:   01/23/20 1138 01/23/20 1731 01/23/20 2107 01/24/20 0018  BP: (!) 119/52 (!) 130/51 (!) 116/50 113/71  Pulse: 78 80 76   Resp: 16 19 18    Temp: 98 F (36.7 C) 98.1 F (36.7 C) 98.1 F (36.7 C)   TempSrc: Oral Oral Oral   SpO2:   97%   Weight:      Height:       Physical Exam: General: Pleasant, sitting on side of bed, no acute distress CV: Regular rate, rhythm. Systolic murmur present. MSK: Bilateral pitting edema Neuro: Awake, alert, oriented x3  Assessment/Plan: [name] is 74yo female with paroxysmal atrial fibrillation, medication-induced cirrhosis, GERD admitted 11/1 with atrial flutter with RVR, now in normal sinus rhythm after spontaneous conversion.   Principal Problem:   Atrial flutter with rapid ventricular response (HCC) Active Problems:   Obesity   Hepatic cirrhosis (HCC)  #Atrial flutter w/ RVR, resolved CHADS-VASc 2. TTE yesterday w/ EF 60-65%, left atrial dilation, mitral regurgitation, aortic stenosis. Patient spontaneously converted to NSR yesterday. Cardiology recommended transition to po cardizem, started eliquis 5mg  bid. Plan to follow-up with atrial fibrillation clinic. Patient also has not had prior sleep study, could benefit after discharge. - Cardizem 90mg  bid - Eliquis 5mg  bid - F/u with Dr. Rayann Heman  - Would benefit from  outpatient sleep study  #Cirrhosis #Thrombocytopenia Discussed risk of bleeding on anticoagulation with patient, agreed benefit outweighs risk given cirrhosis is well-compensated. Will need continued monitoring of PLT (PLT 127 today). - F/u CBC with PCP  #Coronary artery calcifications Patient has had intermittent chest pain prior and during admission. EKG's without ischemic changes. Also has GERD, which she takes Nexium for at home (notes Protonix has not alleviated symptoms for her in the past). CTA during admission showed coronary artery calcifications, aortic atherosclerosis. Not able to review prior stress test. - F/u with cardiology, possible future ischemic eval  #Leukocytosis WBC continues to downtrend, 16>14. Likely reactive process, no signs of infection. Will follow-up CBC with PCP.  DIET: Regular IVF: n/a DVT PPX: Eliquis bid BOWEL: n/a CODE: FULL  Prior to Admission Living Arrangement: Home Anticipated Discharge Location: Home Barriers to Discharge: none Dispo: Anticipated discharge in approximately 0 day(s).   Sanjuan Dame, MD 01/24/2020, 6:42 AM Pager: [phone] After 5pm on weekdays and 1pm on weekends: On Call pager [phone]

## 2020-01-24 NOTE — Progress Notes (Signed)
Pt c/o chest/left epigastric pain. EKG obtained, placed on 2L [place]; BP 113/71, MAP 80, HR 81, SpO2 100, RR 18. EKG appeared to be NSR, pt stated that she typically takes Nexium daily although has been taking Protonix this admission stating it is not effective.   Elaina Hoops, RN

## 2020-01-24 NOTE — Discharge Summary (Addendum)
Name: Savannah Hobbs MRN: [id number] DOB: 1945/11/15 74 y.o. PCP: Pcp, No  Date of Admission: 01/22/2020  1:30 PM Date of Discharge:  01/24/2020 Attending Physician: Axel Filler, *  Discharge Diagnosis: 1. Atrial flutter w/ RVR 2. Cirrhosis 3. Coronary artery calcifications 4. Chronic lower extremity edema  Discharge Medications: Allergies as of 01/24/2020       Reactions   Iodine Hives   Shellfish Allergy Hives        Medication List     STOP taking these medications    hydrochlorothiazide 25 MG tablet Commonly known as: HYDRODIURIL   pantoprazole 20 MG tablet Commonly known as: Protonix       TAKE these medications    apixaban 5 MG Tabs tablet Commonly known as: ELIQUIS Take 1 tablet (5 mg total) by mouth 2 (two) times daily.   cetirizine 10 MG tablet Commonly known as: ZYRTEC Take 10 mg by mouth in the morning.   diltiazem 90 MG 12 hr capsule Commonly known as: CARDIZEM SR Take 1 capsule (90 mg total) by mouth 2 (two) times daily.   docusate sodium 100 MG capsule Commonly known as: COLACE Take 100 mg by mouth in the morning.   ibuprofen 200 MG tablet Commonly known as: ADVIL Take 200 mg by mouth every 6 (six) hours as needed for mild pain (or headaches).   NexIUM 24HR 20 MG capsule Generic drug: esomeprazole Take 20 mg by mouth daily before breakfast.   simvastatin 40 MG tablet Commonly known as: ZOCOR Take 40 mg by mouth at bedtime.   vitamin C 1000 MG tablet Take 1,000 mg by mouth daily.   Vitamin D-3 25 MCG (1000 UT) Caps Take 1,000 Units by mouth daily.        Disposition and follow-up:   Ms.Savannah Hobbs was discharged from [facility] in Stable condition.  At the hospital follow up visit please address:  1.  Atrial flutter w/ RVR: Patient presented with palpitations, intermittent chest pain and found to have atrial flutter with RVR. Started on cardizem, eliquis. Patient previously not on  anticoagulation due to cirrhosis. Plans to follow up with cardiology and atrial fibrillation clinic.        Cirrhosis: Patient has hx of cirrhosis reportedly 2/2 arthritis medication. PLT count >100 throughout admission, no signs of bleeding. Will need to assess platelet count and signs of bleeding.         Chronic lower extremity edema: Patient previously taking HCTZ. Switched to lasix PRN and encouraged to wear compression socks.          Leukocytosis: Patient with leukocytosis on admission, downtrending. Likely reactive process, can re-check CBC.  2.  Labs / imaging needed at time of follow-up: CBC, BMP  3.  Pending labs/ test needing follow-up: n/a  Follow-up Appointments:  Follow-up Information     Thompson Grayer, MD Follow up on 02/01/2020.   Specialty: Cardiology Why: Please arrive 15 minutes early for your 11:15am electrophysiology appointment.  Contact information: [place] [address] [phone]         Donato Heinz, MD Follow up on 02/26/2020.   Specialties: Cardiology, Radiology Why: Please arrive 15 minutes early for your 9:20am post-hospital general cardiology appointment Contact information: [address] [place] [address] [phone]                *Please make sure to also make a follow-up appointment with your primary care doctor within  the next week for blood work and hospital follow-up.  Hospital Course by problem list: 1. Atrial flutter with RVR: Patient with intermittent hx of Afib, not on AC and not taking metoprolol. Presented with palpitations, found to have atrial flutter with RVR. Started on cardizem drip and spontaneously converted to normal sinus rhythm the next day. Switched to po cardizem 90mg  bid and started on eliquis 5mg  bid after discussion about risks and benefits of primary stroke prevention. Echocardiogram showed left atrial dilation and mild aortic stenosis. Also noted to be  at risk for OSA and we recommended she seek out a sleep study. She was referred to the A Fib clinic for longterm management.   2. Cirrhosis: Patient reports hx of cirrhosis 2/2 arthritis medication. On arrival, PLT 190, normal LFT's, no signs of decompensated cirrhosis on exam. Discussed with patient risks and benefits on anticoagulation.    3. Coronary artery calcifications: CTA during admission showed coronary artery calcifications and aortic atherosclerosis. Reportedly had previous ischemic work-up but not available in chart. Patient has intermittent atypical chest pain, which has been contributed to GERD. Cardiology planning on a stress-test evaluation as an outpatient.  4. Chronic LE edema: Patient has had chronic lower extremity edema and has taken HCTZ. Given initiation of cardizem, instructed patient to d/c HCTZ. Encouraged compression socks and given lasix to take as needed.  Discharge Vitals:   BP (!) 125/51   Pulse 76   Temp 98.1 F (36.7 C) (Oral)   Resp 18   Ht 5\' 5"  (1.651 m)   Wt 92.9 kg   SpO2 97%   BMI 34.06 kg/m   Pertinent Labs, Studies, and Procedures:  CBC Latest Ref Rng & Units 01/24/2020 01/23/2020 01/22/2020  WBC 4.0 - 10.5 K/uL 14.3(H) 16.5(H) 25.9(H)  Hemoglobin 12.0 - 15.0 g/dL 10.3(L) 10.8(L) 11.6(L)  Hematocrit 36 - 46 % 32.7(L) 34.7(L) 37.8  Platelets 150 - 400 K/uL 127(L) 143(L) 190   CMP Latest Ref Rng & Units 01/24/2020 01/22/2020 12/02/2015  Glucose 70 - 99 mg/dL 120(H) 122(H) 82  BUN 8 - 23 mg/dL 18 13 11   Creatinine 0.44 - 1.00 mg/dL 0.48 0.45 0.39(L)  Sodium 135 - 145 mmol/L 137 139 139  Potassium 3.5 - 5.1 mmol/L 4.0 3.8 3.8  Chloride 98 - 111 mmol/L 102 103 101  CO2 22 - 32 mmol/L 26 26 28   Calcium 8.9 - 10.3 mg/dL 9.0 8.8(L) 9.3  Total Protein 6.5 - 8.1 g/dL - 7.8 7.8  Total Bilirubin 0.3 - 1.2 mg/dL - 1.6(H) 1.1  Alkaline Phos 38 - 126 U/L - 76 82  AST 15 - 41 U/L - 34 38(H)  ALT 0 - 44 U/L - 18 22   TTE 01/23/2020: 1. Left ventricular  ejection fraction, by estimation, is 60 to 65%. The  left ventricle has normal function. The left ventricle has no regional  wall motion abnormalities. Left ventricular diastolic function could not  be evaluated.   2. Right ventricular systolic function is normal. The right ventricular  size is normal.   3. Left atrial size was moderately dilated.   4. The trivial pericardial effusion is posterior to the left ventricle.   5. The mitral valve is abnormal. Mild mitral valve regurgitation.   6. The aortic valve is tricuspid. There is moderate calcification of the  aortic valve. Aortic valve regurgitation is not visualized. Mild aortic  valve stenosis. Aortic valve area, by VTI measures 1.59 cm. Aortic valve  mean gradient measures 11.0 mmHg.  Aortic  valve Vmax measures 2.15 m/s.   7. The inferior vena cava is normal in size with <50% respiratory  variability, suggesting right atrial pressure of 8 mmHg.   CTA 01/22/2020: Thoracic aorta shows atherosclerotic calcifications without aneurysmal dilatation or dissection. Mild cardiac enlargement is noted. Coronary calcifications are seen. Pulmonary artery shows a normal branching pattern. No filling defect to suggest pulmonary embolism is noted. Spleen is enlarged. Mild changes of cirrhosis of the liver are seen. Findings of portal hypertension are noted with recanalization of the umbilical vein and collateral flow in the anterior abdominal wall.  Discharge Instructions: Discharge Instructions     Amb referral to AFIB Clinic   Complete by: As directed       Ms. Rosana Hoes, I am so glad you are feeling better! You were admitted to the hospital due to an abnormal heart rhythm called atrial flutter. During your stay, we consulted our cardiologist for recommendations for treatment. During the work-up, we got an ultrasound of your heart, which showed your left atrium is dilated. It also showed mild mitral regurgitation and aortic stenosis. The enlarged  left atrium is likely causing your abnormal heart rhythm. During your stay, your heart went back to normal sinus rhythm and you were cleared for discharge. Please see the following notes regarding medications and follow-up:  -START taking: diltiazem (CARDIZEM) 90mg , twice daily. This medication is to help with your heart rate. -START taking: apixaban (ELIQUIS) 5mg , twice daily: This medication is an anti-coagulant to help prevent strokes. -STOP taking: hydrochlorothiazide (HCTZ). This medication could lower your blood pressure even further. -We recommend compression stockings for the lower extremity edema. We will prescribe furosemide (LASIX), 20mg , which you can take as needed. Make sure to only take one of these at a time.  -You have an appointment scheduled with our atrial fibrillation clinic on February 01, 2020. Please make sure to attend the clinic for further treatment recommendations.  -Given you are on an anticoagulation medication, please make sure to watch for signs of bleeding, including blood in the stool, urine, or vomit. We will need to continue monitoring your platelet counts to assess the risk of bleeding. Your primary care doctor can follow-up with this.  It was a pleasure meeting you, Ms. Mimbs. I wish you the best and I hope you stay happy and healthy!  Thank you, Sanjuan Dame, MD  Signed: Sanjuan Dame, MD 01/24/2020, 10:33 AM   Pager: [phone]

## 2020-01-24 NOTE — Progress Notes (Signed)
Pt complained of chest discomfort after lying back in bed to read. Pt states she thinks "it is just gas" but wanted to let me know. EKG without acute changes. Pt remains in SR, VS stable. Pt states she feels better after burping. Will continue to monitor.Jessie Foot, RN

## 2020-01-28 LAB — CULTURE, BLOOD (ROUTINE X 2)
Culture: NO GROWTH
Culture: NO GROWTH
Special Requests: ADEQUATE
Special Requests: ADEQUATE

## 2020-02-01 ENCOUNTER — Other Ambulatory Visit: Payer: Self-pay

## 2020-02-01 ENCOUNTER — Ambulatory Visit (INDEPENDENT_AMBULATORY_CARE_PROVIDER_SITE_OTHER): Payer: Medicare Other | Admitting: Internal Medicine

## 2020-02-01 ENCOUNTER — Encounter: Payer: Self-pay | Admitting: Internal Medicine

## 2020-02-01 ENCOUNTER — Telehealth (HOSPITAL_COMMUNITY): Payer: Self-pay | Admitting: *Deleted

## 2020-02-01 VITALS — BP 130/68 | HR 83 | Ht 65.0 in | Wt 218.4 lb

## 2020-02-01 DIAGNOSIS — I480 Paroxysmal atrial fibrillation: Secondary | ICD-10-CM

## 2020-02-01 DIAGNOSIS — R0683 Snoring: Secondary | ICD-10-CM

## 2020-02-01 DIAGNOSIS — I4892 Unspecified atrial flutter: Secondary | ICD-10-CM

## 2020-02-01 MED ORDER — FLECAINIDE ACETATE 100 MG PO TABS: 200.0000 mg | ORAL_TABLET | ORAL | 1 refills | Status: DC

## 2020-02-01 MED ORDER — FUROSEMIDE 20 MG PO TABS
20.0000 mg | ORAL_TABLET | Freq: Every day | ORAL | 0 refills | Status: DC | PRN
Start: 2020-02-01 — End: 2020-02-26

## 2020-02-01 NOTE — Patient Instructions (Addendum)
Medication Instructions:  Start the Flecainide 200 mg (pill in pocket)  Take 2 tablets as needed daily for Afib  *If you need a refill on your cardiac medications before your next appointment, please call your pharmacy*  Lab Work: None ordered.  If you have labs (blood work) drawn today and your tests are completely normal, you will receive your results only by: Marland Kitchen MyChart Message (if you have MyChart) OR . A paper copy in the mail If you have any lab test that is abnormal or we need to change your treatment, we will call you to review the results.  Testing/Procedures: Your physician has recommended that you have a sleep study. This test records several body functions during sleep, including: brain activity, eye movement, oxygen and carbon dioxide blood levels, heart rate and rhythm, breathing rate and rhythm, the flow of air through your mouth and nose, snoring, body muscle movements, and chest and belly movement.   Follow-Up: At [facility], you and your health needs are our priority.  As part of our continuing mission to provide you with exceptional heart care, we have created designated Provider Care Teams.  These Care Teams include your primary Cardiologist (physician) and Advanced Practice Providers (APPs -  Physician Assistants and Nurse Practitioners) who all work together to provide you with the care you need, when you need it.  We recommend signing up for the patient portal called "MyChart".  Sign up information is provided on this After Visit Summary.  MyChart is used to connect with patients for Virtual Visits (Telemedicine).  Patients are able to view lab/test results, encounter notes, upcoming appointments, etc.  Non-urgent messages can be sent to your provider as well.   To learn more about what you can do with MyChart, go to [email or web address].    Your next appointment:   Your physician wants you to follow-up in: 6 weeks with the Afib Clinic. They will contact you to  schedule.    Other Instructions:

## 2020-02-01 NOTE — Progress Notes (Signed)
Electrophysiology Office Note   Date:  02/01/2020   ID:  Savannah Hobbs, Savannah Hobbs 08-28-1945, MRN [id number]  PCP:  Moshe Cipro, MD  Cardiologist:  Dr Gardiner Rhyme Primary Electrophysiologist: Thompson Grayer, MD    CC: atrial flutter   History of Present Illness: Savannah Hobbs is a 74 y.o. female who presents today for electrophysiology evaluation.   She is referred by Dr Alda Lea for EP consultation regarding atrial flutter and atrial fibrillation. She has a h/o cirrhosis.  She had atrial fibrillation after covid 12/2018.  She had recurrent atrial fibrillation after her Moderna Vaccine in January.  She did well until November when she developed typical appearing atrial flutter.  She reports symptoms of palpitations and SOB with her arrhythmia.    She has been placed on eliquis for chadsvasc score of 4.  She has a h/o cirrhosis but no prior bleeding.  Today, she denies symptoms of palpitations, chest pain, shortness of breath, orthopnea, PND, lower extremity edema, claudication, dizziness, presyncope, syncope, bleeding, or neurologic sequela. The patient is tolerating medications without difficulties and is otherwise without complaint today.    Past Medical History:  Diagnosis Date  . Allergy   . Arthritis   . Asthma   . Cirrhosis of liver ([place])    due to "medications for arthritis".   . Elevated cholesterol   . Erosive esophagitis 1995  . Fatty liver   . GERD (gastroesophageal reflux disease)   . Hiatal hernia    LARGE  . Hx of colonic polyps 05/27/2018  . Hx of transfusion of platelets   . Paroxysmal atrial fibrillation ([facility])   . Thrombocytopenia ([place])   . Typical atrial flutter ([place])   . UTI (lower urinary tract infection)    Past Surgical History:  Procedure Laterality Date  . ABDOMINAL HYSTERECTOMY     partial  . APPENDECTOMY    . COLONOSCOPY    . ESOPHAGOGASTRODUODENOSCOPY    . GYNECOLOGIC CRYOSURGERY     x2  . REPLACEMENT TOTAL KNEE Left 2016  .  TONSILLECTOMY    . TOTAL KNEE ARTHROPLASTY Right   . TUBAL LIGATION    . UPPER GASTROINTESTINAL ENDOSCOPY       Current Outpatient Medications  Medication Sig Dispense Refill  . apixaban (ELIQUIS) 5 MG TABS tablet Take 1 tablet (5 mg total) by mouth 2 (two) times daily. 60 tablet 0  . Ascorbic Acid (VITAMIN C) 1000 MG tablet Take 1,000 mg by mouth daily.    . cetirizine (ZYRTEC) 10 MG tablet Take 10 mg by mouth in the morning.    . Cholecalciferol (VITAMIN D-3) 25 MCG (1000 UT) CAPS Take 1,000 Units by mouth daily.    Marland Kitchen diltiazem (CARDIZEM CD) 180 MG 24 hr capsule Take 1 capsule (180 mg total) by mouth daily. 30 capsule 6  . docusate sodium (COLACE) 100 MG capsule Take 100 mg by mouth in the morning.     Marland Kitchen esomeprazole (NEXIUM 24HR) 20 MG capsule Take 20 mg by mouth daily before breakfast.    . furosemide (LASIX) 20 MG tablet Take 1 tablet (20 mg total) by mouth daily as needed. Take 1 tablet daily as needed for lower leg swelling. 30 tablet 0  . ibuprofen (ADVIL) 200 MG tablet Take 200 mg by mouth every 6 (six) hours as needed for mild pain (or headaches).    . simvastatin (ZOCOR) 40 MG tablet Take 40 mg by mouth at bedtime.    . flecainide (TAMBOCOR) 100 MG tablet Take 2  tablets (200 mg total) by mouth as directed. Take 2 tablets as needed daily for Afib 10 tablet 1   No current facility-administered medications for this visit.    Allergies:   Iodine and Shellfish allergy   Social History:  The patient  reports that she has never smoked. She has never used smokeless tobacco. She reports that she does not drink alcohol and does not use drugs.   Family History:  The patient's family history includes Esophageal cancer in her father; Hypertension in her mother.    ROS:  Please see the history of present illness.   All other systems are personally reviewed and negative.    PHYSICAL EXAM: VS:  BP 130/68   Pulse 83   Ht 5\' 5"  (1.651 m)   Wt 218 lb 6.4 oz (99.1 kg)   SpO2 97%   BMI  36.34 kg/m  , BMI Body mass index is 36.34 kg/m. GEN: Well nourished, well developed, in no acute distress HEENT: normal Neck: no JVD, carotid bruits, or masses Cardiac: RRR; no murmurs, rubs, or gallops,no edema  Respiratory:  clear to auscultation bilaterally, normal work of breathing GI: soft, nontender, nondistended, + BS MS: no deformity or atrophy Skin: warm and dry  Neuro:  Strength and sensation are intact Psych: euthymic mood, full affect  EKG:  EKG is ordered today. The ekg ordered today is personally reviewed and shows sinus rhythm   Recent Labs: 01/22/2020: ALT 18; B Natriuretic Peptide 479.7; Magnesium 1.6; TSH 1.912 01/24/2020: BUN 18; Creatinine, Ser 0.48; Hemoglobin 10.3; Platelets 127; Potassium 4.0; Sodium 137  personally reviewed   Lipid Panel     Component Value Date/Time   CHOL 125 01/23/2020 0222   TRIG 142 01/23/2020 0222   HDL 44 01/23/2020 0222   CHOLHDL 2.8 01/23/2020 0222   VLDL 28 01/23/2020 0222   LDLCALC 53 01/23/2020 0222   personally reviewed   Wt Readings from Last 3 Encounters:  02/01/20 218 lb 6.4 oz (99.1 kg)  01/23/20 204 lb 11.2 oz (92.9 kg)  05/24/18 217 lb (98.4 kg)    Echo 01/23/20- EF 60%,k moderate LA enlargement, mild MR  ekgs in epic reveal typical appearing atrial flutter  Other studies personally reviewed: Additional studies/ records that were reviewed today include: prior hospital records  Review of the above records today demonstrates: as above   ASSESSMENT AND PLAN:  1.  Paroxysmal atrial fibrillation/ atrial flutter The patient has symptomatic, recurrent atrial fibrillation and atrial flutter. she has not tried AADs   Chads2vasc score is 2.  she is anticoagulated with eliquis . Therapeutic strategies for afib including medicine (flecainide) and ablation were discussed in detail with the patient today. Risk, benefits, and alternatives to EP study and radiofrequency ablation for afib were also discussed in detail  today. These risks include but are not limited to stroke, bleeding, vascular damage, tamponade, perforation, damage to the esophagus, lungs, and other structures, pulmonary vein stenosis, worsening renal function, and death. The patient understands these risk and wishes to think about this further.  She will contact my office if she decides to proceed. We will use flecainide 200mg  as a "pill in pocket" treatment for afib in the interim.  2. Snoring Sleep study is ordered  3. Obesity Body mass index is 36.34 kg/m. We discussed importance of regular exercise and weight loss today [employer] school teacher Trial data)  Risks, benefits and potential toxicities for medications prescribed and/or refilled reviewed with patient today.    Follow-up:  6 weeks  in the AF clinic  Current medicines are reviewed at length with the patient today.   The patient does not have concerns regarding her medicines.  The following changes were made today:  none  Labs/ tests ordered today include:  Orders Placed This Encounter  Procedures  . EKG 12-Lead  . [place] Sleep Study     Signed, Thompson Grayer, MD  02/01/2020 3:07 PM     [place] [address] [place] [address] [phone] (office) [phone] (fax)

## 2020-02-01 NOTE — Telephone Encounter (Signed)
Close encounter 

## 2020-02-02 ENCOUNTER — Ambulatory Visit (HOSPITAL_COMMUNITY)
Admission: RE | Admit: 2020-02-02 | Discharge: 2020-02-02 | Disposition: A | Discharge status: Home / Self Care | Payer: Medicare Other | Source: Ambulatory Visit | Attending: Cardiology | Admitting: Cardiology

## 2020-02-02 DIAGNOSIS — I4892 Unspecified atrial flutter: Secondary | ICD-10-CM | POA: Diagnosis not present

## 2020-02-02 MED ORDER — TECHNETIUM TC 99M TETROFOSMIN IV KIT
31.0000 | PACK | Freq: Once | INTRAVENOUS | Status: AC | PRN
  Administered 2020-02-02: 31 via INTRAVENOUS
  Filled 2020-02-02: qty 31

## 2020-02-02 MED ORDER — TECHNETIUM TC 99M TETROFOSMIN IV KIT
10.1000 | PACK | Freq: Once | INTRAVENOUS | Status: AC | PRN
  Administered 2020-02-02: 10.1 via INTRAVENOUS
  Filled 2020-02-02: qty 11

## 2020-02-02 MED ORDER — REGADENOSON 0.4 MG/5ML IV SOLN
0.4000 mg | Freq: Once | INTRAVENOUS | Status: AC
Start: 2020-02-02 — End: 2020-02-02
  Administered 2020-02-02: 0.4 mg via INTRAVENOUS

## 2020-02-05 LAB — MYOCARDIAL PERFUSION IMAGING
LV dias vol: 117 mL (ref 46–106)
LV sys vol: 37 mL
Peak HR: 84 {beats}/min
Rest HR: 69 {beats}/min
SDS: 2
SRS: 0
SSS: 2
TID: 1.06

## 2020-02-06 ENCOUNTER — Other Ambulatory Visit: Payer: Self-pay | Admitting: *Deleted

## 2020-02-06 MED ORDER — APIXABAN 5 MG PO TABS: 5.0000 mg | ORAL_TABLET | Freq: Two times a day (BID) | ORAL | 1 refills | Status: DC

## 2020-02-13 ENCOUNTER — Telehealth: Payer: Self-pay | Admitting: Cardiology

## 2020-02-13 NOTE — Telephone Encounter (Signed)
Spoke with patient. Informed patient that 2 weeks of samples are at the front desk. Patient has not completed a patient assistance application, application placed with samples.   Lot [id number] Exp 08/23

## 2020-02-13 NOTE — Telephone Encounter (Signed)
  Patient calling the office for samples of medication:  1.  What medication and dosage are you requesting samples for? apixaban (ELIQUIS) 5 MG TABS tablet  2.  Are you currently out of this medication? No  Patient wanted to know if Dr. Gardiner Rhyme had any samples or discount cards for Eliquis. The patient said her copay was over $300.  If the office has any cards she asks that we mail it to her. Advised the patient that if we had samples that she would have to come to the office to pick them up.

## 2020-02-26 ENCOUNTER — Ambulatory Visit (INDEPENDENT_AMBULATORY_CARE_PROVIDER_SITE_OTHER): Payer: Medicare Other | Admitting: Cardiology

## 2020-02-26 ENCOUNTER — Encounter: Payer: Self-pay | Admitting: Cardiology

## 2020-02-26 ENCOUNTER — Telehealth: Payer: Self-pay | Admitting: *Deleted

## 2020-02-26 ENCOUNTER — Ambulatory Visit: Payer: Medicare Other | Admitting: Cardiology

## 2020-02-26 ENCOUNTER — Other Ambulatory Visit: Payer: Self-pay

## 2020-02-26 VITALS — BP 130/66 | HR 82 | Ht 65.0 in | Wt 216.0 lb

## 2020-02-26 DIAGNOSIS — I480 Paroxysmal atrial fibrillation: Secondary | ICD-10-CM

## 2020-02-26 DIAGNOSIS — R0602 Shortness of breath: Secondary | ICD-10-CM | POA: Diagnosis not present

## 2020-02-26 DIAGNOSIS — E785 Hyperlipidemia, unspecified: Secondary | ICD-10-CM

## 2020-02-26 DIAGNOSIS — R600 Localized edema: Secondary | ICD-10-CM

## 2020-02-26 DIAGNOSIS — R079 Chest pain, unspecified: Secondary | ICD-10-CM | POA: Diagnosis not present

## 2020-02-26 DIAGNOSIS — I350 Nonrheumatic aortic (valve) stenosis: Secondary | ICD-10-CM

## 2020-02-26 MED ORDER — ATORVASTATIN CALCIUM 20 MG PO TABS: 20.0000 mg | ORAL_TABLET | Freq: Every day | ORAL | 3 refills | Status: DC

## 2020-02-26 MED ORDER — FUROSEMIDE 40 MG PO TABS: 40.0000 mg | ORAL_TABLET | Freq: Every day | ORAL | 3 refills | Status: DC

## 2020-02-26 MED ORDER — DILTIAZEM HCL ER COATED BEADS 180 MG PO CP24: 180.0000 mg | ORAL_CAPSULE | Freq: Every day | ORAL | 3 refills | Status: DC

## 2020-02-26 NOTE — Progress Notes (Signed)
Cardiology Office Note:    Date:  02/26/2020   ID:  Savannah, Hobbs Mar 21, 1946, MRN [id number]  PCP:  Moshe Cipro, MD  Cardiologist:  Donato Heinz, MD  Electrophysiologist:  None   Referring MD: No ref. provider found   Chief Complaint  Patient presents with  . Atrial Fibrillation    History of Present Illness:    Savannah Hobbs is a 74 y.o. female with a hx of paroxysmal atrial fibrillation, cirrhosis, hyperlipidemia who presents for follow-up.  She was diagnosed with COVID-19 in October 2020.  Had an episode of atrial fibrillation while recovering from this.  In January 2021 she had a second episode of atrial fibrillation that occurred after she received her first dose of the Covid vaccine.  She initially followed with cardiologist in [place] and was started on metoprolol.  She was not started on anticoagulation.  She presented to [facility] on 01/22/2020 with A. fib with RVR.  Echocardiogram showed normal biventricular function, mild MR, mild AS.  She was started on diltiazem drip for rate control and converted to normal sinus rhythm.  She was switched to p.o. diltiazem.  Eliquis 5 mg twice daily was started given CHA2DS2-VASc 4.  She reported having chest pain and a Lexiscan Myoview was done on 02/02/2020, which showed normal perfusion, EF 68%.  She saw Dr. Rayann Heman, considering ablation.  Recommended flecainide 200 mg daily as pill in pocket treatment for A. Fib.  Referred for sleep study.    She reports no recent episodes of atrial fibrillation.  Does states she has had occasional palpitations, last for 5 minutes or so.  Rare chest pain.  Does report intermittent dyspnea.  Also has occasional lightheadedness, denies any syncope.  Reports that her lower extremity edema has worsened.  She is taking Eliquis, states that she had a small amount of BRBPR while having issues with constipation, but resolved.   Wt Readings from Last 3 Encounters:  02/26/20 216 lb (98  kg)  02/02/20 204 lb (92.5 kg)  02/01/20 218 lb 6.4 oz (99.1 kg)     Past Medical History:  Diagnosis Date  . Allergy   . Arthritis   . Asthma   . Cirrhosis of liver ([place])    due to "medications for arthritis".   . Elevated cholesterol   . Erosive esophagitis 1995  . Fatty liver   . GERD (gastroesophageal reflux disease)   . Hiatal hernia    LARGE  . Hx of colonic polyps 05/27/2018  . Hx of transfusion of platelets   . Paroxysmal atrial fibrillation (HCC)   . Thrombocytopenia ([place])   . Typical atrial flutter ([place])   . UTI (lower urinary tract infection)     Past Surgical History:  Procedure Laterality Date  . ABDOMINAL HYSTERECTOMY     partial  . APPENDECTOMY    . COLONOSCOPY    . ESOPHAGOGASTRODUODENOSCOPY    . GYNECOLOGIC CRYOSURGERY     x2  . REPLACEMENT TOTAL KNEE Left 2016  . TONSILLECTOMY    . TOTAL KNEE ARTHROPLASTY Right   . TUBAL LIGATION    . UPPER GASTROINTESTINAL ENDOSCOPY      Current Medications: Current Meds  Medication Sig  . apixaban (ELIQUIS) 5 MG TABS tablet Take 1 tablet (5 mg total) by mouth 2 (two) times daily.  . Ascorbic Acid (VITAMIN C) 1000 MG tablet Take 1,000 mg by mouth daily.  . cetirizine (ZYRTEC) 10 MG tablet Take 10 mg by mouth in the morning.  Marland Kitchen  Cholecalciferol (VITAMIN D-3) 25 MCG (1000 UT) CAPS Take 1,000 Units by mouth daily.  Marland Kitchen diltiazem (CARDIZEM CD) 180 MG 24 hr capsule Take 1 capsule (180 mg total) by mouth daily.  Marland Kitchen docusate sodium (COLACE) 100 MG capsule Take 100 mg by mouth in the morning.   Marland Kitchen esomeprazole (NEXIUM 24HR) 20 MG capsule Take 20 mg by mouth daily before breakfast.  . flecainide (TAMBOCOR) 100 MG tablet Take 2 tablets (200 mg total) by mouth as directed. Take 2 tablets as needed daily for Afib  . furosemide (LASIX) 40 MG tablet Take 1 tablet (40 mg total) by mouth daily.  Marland Kitchen ibuprofen (ADVIL) 200 MG tablet Take 200 mg by mouth every 6 (six) hours as needed for mild pain (or headaches).  . [DISCONTINUED]  diltiazem (CARDIZEM CD) 180 MG 24 hr capsule Take 1 capsule (180 mg total) by mouth daily.  . [DISCONTINUED] furosemide (LASIX) 20 MG tablet Take 1 tablet (20 mg total) by mouth daily as needed. Take 1 tablet daily as needed for lower leg swelling.  . [DISCONTINUED] simvastatin (ZOCOR) 40 MG tablet Take 40 mg by mouth at bedtime.     Allergies:   Iodine and Shellfish allergy   Social History   Socioeconomic History  . Marital status: Married    Spouse name: Not on file  . Number of children: 3  . Years of education: Not on file  . Highest education level: Not on file  Occupational History  . Occupation: retired Therapist, sports  Tobacco Use  . Smoking status: Never Smoker  . Smokeless tobacco: Never Used  Substance and Sexual Activity  . Alcohol use: No  . Drug use: No  . Sexual activity: Not on file  Other Topics Concern  . 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   3 sons, 12-12-2015 1 son died in his sleep unclear cause   Updated 12/02/2015   Social Determinants of Health   Financial Resource Strain:   . Difficulty of Paying Living Expenses: Not on file  Food Insecurity:   . Worried About Charity fundraiser in the Last Year: Not on file  . Ran Out of Food in the Last Year: Not on file  Transportation Needs:   . Lack of Transportation (Medical): Not on file  . Lack of Transportation (Non-Medical): Not on file  Physical Activity:   . Days of Exercise per Week: Not on file  . Minutes of Exercise per Session: Not on file  Stress:   . Feeling of Stress : Not on file  Social Connections:   . Frequency of Communication with Friends and Family: Not on file  . Frequency of Social Gatherings with Friends and Family: Not on file  . Attends Religious Services: Not on file  . Active Member of Clubs or Organizations: Not on file  . Attends Archivist Meetings: Not on file  . Marital Status: Not  on file     Family History: The patient's family history includes Esophageal cancer in her father; Hypertension in her mother. There is no history of Colon polyps, Colon cancer, Rectal cancer, or Stomach cancer.  ROS:   Please see the history of present illness.     All other systems reviewed and are negative.  EKGs/Labs/Other Studies Reviewed:    The following studies were reviewed today:   EKG:  EKG is ordered today.  The ekg ordered today demonstrates normal sinus  rhythm, rate 82, no ST/T abnormalities  Recent Labs: 01/22/2020: ALT 18; B Natriuretic Peptide 479.7; Magnesium 1.6; TSH 1.912 01/24/2020: BUN 18; Creatinine, Ser 0.48; Hemoglobin 10.3; Platelets 127; Potassium 4.0; Sodium 137  Recent Lipid Panel    Component Value Date/Time   CHOL 125 01/23/2020 0222   TRIG 142 01/23/2020 0222   HDL 44 01/23/2020 0222   CHOLHDL 2.8 01/23/2020 0222   VLDL 28 01/23/2020 0222   LDLCALC 53 01/23/2020 0222    Physical Exam:    VS:  BP 130/66   Pulse 82   Ht 5\' 5"  (1.651 m)   Wt 216 lb (98 kg)   SpO2 97%   BMI 35.94 kg/m     Wt Readings from Last 3 Encounters:  02/26/20 216 lb (98 kg)  02/02/20 204 lb (92.5 kg)  02/01/20 218 lb 6.4 oz (99.1 kg)     GEN: in no acute distress HEENT: Normal NECK: No JVD LYMPHATICS: No lymphadenopathy CARDIAC: RRR, 2 out of 6 systolic murmur RESPIRATORY:  Clear to auscultation without rales, wheezing or rhonchi  ABDOMEN: Soft, non-tender, non-distended MUSCULOSKELETAL: 2+ bilateral lower extremity edema SKIN: Warm and dry NEUROLOGIC:  Alert and oriented x 3 PSYCHIATRIC:  Normal affect   ASSESSMENT:    1. Paroxysmal atrial fibrillation (HCC)   2. Chest pain of uncertain etiology   3. Shortness of breath   4. Leg edema   5. Mild aortic stenosis   6. Hyperlipidemia, unspecified hyperlipidemia type    PLAN:    Paroxysmal atrial fibrillation: CHA2DS2-VASc score 3 (hypertension, age, female).  Lexiscan Myoview was done on 02/02/2020,  which showed normal perfusion, EF 68% -Continue Eliquis 5 mg twice daily -Continue diltiazem CD 180 mg daily -Continue as needed flecainide -Follows with EP, considering ablation  Chest pain: Atypical in description. Lexiscan Myoview was done on 02/02/2020, which showed normal perfusion, EF 68%.  No further work-up recommended  Aortic stenosis: Mild on echocardiogram on 01/23/2020.  Will monitor.  Hypertension: Appears controlled, continue diltiazem  Bilateral lower extremity edema: Worsening.  Will check CMP, BNP.  Increase Lasix to 40 mg daily  Hyperlipidemia: Will switch from simvastatin to atorvastatin given interaction between diltiazem and simvastatin.  RTC in 1 month    Medication Adjustments/Labs and Tests Ordered: Current medicines are reviewed at length with the patient today.  Concerns regarding medicines are outlined above.  Orders Placed This Encounter  Procedures  . Comprehensive metabolic panel  . CBC  . Brain natriuretic peptide  . EKG 12-Lead   Meds ordered this encounter  Medications  . diltiazem (CARDIZEM CD) 180 MG 24 hr capsule    Sig: Take 1 capsule (180 mg total) by mouth daily.    Dispense:  90 capsule    Refill:  3  . furosemide (LASIX) 40 MG tablet    Sig: Take 1 tablet (40 mg total) by mouth daily.    Dispense:  90 tablet    Refill:  3  . atorvastatin (LIPITOR) 20 MG tablet    Sig: Take 1 tablet (20 mg total) by mouth daily.    Dispense:  90 tablet    Refill:  3    STOP simvastatin    Patient Instructions  Medication Instructions:  INCREASE furosemide (Lasix) to 40 mg daily STOP simvastatin START atorvastatin (Lipitor) 20 mg daily  *If you need a refill on your cardiac medications before your next appointment, please call your pharmacy*   Lab Work: CMET, CBC, BNP today  If you have labs (  blood work) drawn today and your tests are completely normal, you will receive your results only by: Marland Kitchen MyChart Message (if you have MyChart)  OR . A paper copy in the mail If you have any lab test that is abnormal or we need to change your treatment, we will call you to review the results.   Follow-Up: At [facility], you and your health needs are our priority.  As part of our continuing mission to provide you with exceptional heart care, we have created designated Provider Care Teams.  These Care Teams include your primary Cardiologist (physician) and Advanced Practice Providers (APPs -  Physician Assistants and Nurse Practitioners) who all work together to provide you with the care you need, when you need it.  We recommend signing up for the patient portal called "MyChart".  Sign up information is provided on this After Visit Summary.  MyChart is used to connect with patients for Virtual Visits (Telemedicine).  Patients are able to view lab/test results, encounter notes, upcoming appointments, etc.  Non-urgent messages can be sent to your provider as well.   To learn more about what you can do with MyChart, go to [email or web address].    Your next appointment:   1 month(s)  The format for your next appointment:   In Person  Provider:   Oswaldo Milian, MD       Signed, Donato Heinz, MD  02/26/2020 11:35 PM    [place]

## 2020-02-26 NOTE — Patient Instructions (Addendum)
Medication Instructions:  INCREASE furosemide (Lasix) to 40 mg daily STOP simvastatin START atorvastatin (Lipitor) 20 mg daily  *If you need a refill on your cardiac medications before your next appointment, please call your pharmacy*   Lab Work: CMET, CBC, BNP today  If you have labs (blood work) drawn today and your tests are completely normal, you will receive your results only by: Marland Kitchen MyChart Message (if you have MyChart) OR . A paper copy in the mail If you have any lab test that is abnormal or we need to change your treatment, we will call you to review the results.   Follow-Up: At [facility], you and your health needs are our priority.  As part of our continuing mission to provide you with exceptional heart care, we have created designated Provider Care Teams.  These Care Teams include your primary Cardiologist (physician) and Advanced Practice Providers (APPs -  Physician Assistants and Nurse Practitioners) who all work together to provide you with the care you need, when you need it.  We recommend signing up for the patient portal called "MyChart".  Sign up information is provided on this After Visit Summary.  MyChart is used to connect with patients for Virtual Visits (Telemedicine).  Patients are able to view lab/test results, encounter notes, upcoming appointments, etc.  Non-urgent messages can be sent to your provider as well.   To learn more about what you can do with MyChart, go to [email or web address].    Your next appointment:   1 month(s)  The format for your next appointment:   In Person  Provider:   Oswaldo Milian, MD

## 2020-02-26 NOTE — Telephone Encounter (Signed)
Eliquis patient assistance paperwork faxed to [facility].

## 2020-02-27 ENCOUNTER — Other Ambulatory Visit: Payer: Self-pay | Admitting: *Deleted

## 2020-02-27 DIAGNOSIS — R600 Localized edema: Secondary | ICD-10-CM

## 2020-02-27 DIAGNOSIS — Z79899 Other long term (current) drug therapy: Secondary | ICD-10-CM

## 2020-02-27 LAB — COMPREHENSIVE METABOLIC PANEL
ALT: 18 IU/L (ref 0–32)
AST: 38 IU/L (ref 0–40)
Albumin/Globulin Ratio: 1 — ABNORMAL LOW (ref 1.2–2.2)
Albumin: 3.7 g/dL (ref 3.7–4.7)
Alkaline Phosphatase: 122 IU/L — ABNORMAL HIGH (ref 44–121)
BUN/Creatinine Ratio: 18 (ref 12–28)
BUN: 9 mg/dL (ref 8–27)
Bilirubin Total: 1 mg/dL (ref 0.0–1.2)
CO2: 22 mmol/L (ref 20–29)
Calcium: 8.9 mg/dL (ref 8.7–10.3)
Chloride: 106 mmol/L (ref 96–106)
Creatinine, Ser: 0.5 mg/dL — ABNORMAL LOW (ref 0.57–1.00)
GFR calc Af Amer: 112 mL/min/{1.73_m2} (ref >59)
GFR calc non Af Amer: 97 mL/min/{1.73_m2} (ref >59)
Globulin, Total: 3.7 g/dL (ref 1.5–4.5)
Glucose: 52 mg/dL — ABNORMAL LOW (ref 65–99)
Potassium: 3.7 mmol/L (ref 3.5–5.2)
Sodium: 141 mmol/L (ref 134–144)
Total Protein: 7.4 g/dL (ref 6.0–8.5)

## 2020-02-27 LAB — CBC
Hematocrit: 31.5 % — ABNORMAL LOW (ref 34.0–46.6)
Hemoglobin: 10.3 g/dL — ABNORMAL LOW (ref 11.1–15.9)
MCH: 26.8 pg (ref 26.6–33.0)
MCHC: 32.7 g/dL (ref 31.5–35.7)
MCV: 82 fL (ref 79–97)
Platelets: 107 10*3/uL — ABNORMAL LOW (ref 150–450)
RBC: 3.84 x10E6/uL (ref 3.77–5.28)
RDW: 21.1 % — ABNORMAL HIGH (ref 11.7–15.4)
WBC: 22 10*3/uL (ref 3.4–10.8)

## 2020-02-27 LAB — BRAIN NATRIURETIC PEPTIDE: BNP: 93.8 pg/mL (ref 0.0–100.0)

## 2020-02-27 MED ORDER — POTASSIUM CHLORIDE CRYS ER 20 MEQ PO TBCR: 20.0000 meq | EXTENDED_RELEASE_TABLET | Freq: Every day | ORAL | 3 refills | Status: DC

## 2020-03-01 NOTE — Telephone Encounter (Signed)
Follow up:    Patient just calling to check the status of her paper work. Paper work has been sent.

## 2020-03-06 LAB — BASIC METABOLIC PANEL
BUN/Creatinine Ratio: 26 (ref 12–28)
BUN: 11 mg/dL (ref 8–27)
CO2: 24 mmol/L (ref 20–29)
Calcium: 9 mg/dL (ref 8.7–10.3)
Chloride: 105 mmol/L (ref 96–106)
Creatinine, Ser: 0.42 mg/dL — ABNORMAL LOW (ref 0.57–1.00)
GFR calc Af Amer: 118 mL/min/{1.73_m2} (ref >59)
GFR calc non Af Amer: 103 mL/min/{1.73_m2} (ref >59)
Glucose: 64 mg/dL — ABNORMAL LOW (ref 65–99)
Potassium: 4.2 mmol/L (ref 3.5–5.2)
Sodium: 139 mmol/L (ref 134–144)

## 2020-03-18 NOTE — Telephone Encounter (Signed)
Received fax from [facility]-patient is not eligible for patient assistance (no reason stated).   A letter was mailed to patient by BM to make aware.

## 2020-03-25 NOTE — Progress Notes (Deleted)
Cardiology Office Note:    Date:  03/25/2020   ID:  Savannah, Hobbs 03-May-1945, MRN [id number]  PCP:  Romeo Rabon, MD  Cardiologist:  Little Ishikawa, MD  Electrophysiologist:  None   Referring MD: Romeo Rabon, MD   No chief complaint on file.   History of Present Illness:    Savannah Hobbs is a 75 y.o. female with a hx of paroxysmal atrial fibrillation, cirrhosis, hyperlipidemia who presents for follow-up.  She was diagnosed with COVID-19 in October 2020.  Had an episode of atrial fibrillation while recovering from this.  In January 2021 she had a second episode of atrial fibrillation that occurred after she received her first dose of the Covid vaccine.  She initially followed with cardiologist in [place] and was started on metoprolol.  She was not started on anticoagulation.  She presented to [facility] on 01/22/2020 with A. fib with RVR.  Echocardiogram showed normal biventricular function, mild MR, mild AS.  She was started on diltiazem drip for rate control and converted to normal sinus rhythm.  She was switched to p.o. diltiazem.  Eliquis 5 mg twice daily was started given CHA2DS2-VASc 4.  She reported having chest pain and a Lexiscan Myoview was done on 02/02/2020, which showed normal perfusion, EF 68%.  She saw Dr. Johney Frame, considering ablation.  Recommended flecainide 200 mg daily as pill in pocket treatment for A. Fib.  Referred for sleep study.    Since last clinic visit,  She reports no recent episodes of atrial fibrillation.  Does states she has had occasional palpitations, last for 5 minutes or so.  Rare chest pain.  Does report intermittent dyspnea.  Also has occasional lightheadedness, denies any syncope.  Reports that her lower extremity edema has worsened.  She is taking Eliquis, states that she had a small amount of BRBPR while having issues with constipation, but resolved.   Wt Readings from Last 3 Encounters:  02/26/20 216 lb (98 kg)   02/02/20 204 lb (92.5 kg)  02/01/20 218 lb 6.4 oz (99.1 kg)     Past Medical History:  Diagnosis Date  . Allergy   . Arthritis   . Asthma   . Cirrhosis of liver (HCC)    due to "medications for arthritis".   . Elevated cholesterol   . Erosive esophagitis 1995  . Fatty liver   . GERD (gastroesophageal reflux disease)   . Hiatal hernia    LARGE  . Hx of colonic polyps 05/27/2018  . Hx of transfusion of platelets   . Paroxysmal atrial fibrillation (HCC)   . Thrombocytopenia (HCC)   . Typical atrial flutter (HCC)   . UTI (lower urinary tract infection)     Past Surgical History:  Procedure Laterality Date  . ABDOMINAL HYSTERECTOMY     partial  . APPENDECTOMY    . COLONOSCOPY    . ESOPHAGOGASTRODUODENOSCOPY    . GYNECOLOGIC CRYOSURGERY     x2  . REPLACEMENT TOTAL KNEE Left 2016  . TONSILLECTOMY    . TOTAL KNEE ARTHROPLASTY Right   . TUBAL LIGATION    . UPPER GASTROINTESTINAL ENDOSCOPY      Current Medications: No outpatient medications have been marked as taking for the 03/29/20 encounter (Appointment) with Little Ishikawa, MD.     Allergies:   Iodine and Shellfish allergy   Social History   Socioeconomic History  . Marital status: Married    Spouse name: Not on file  . Number of children: 3  .  Years of education: Not on file  . Highest education level: Not on file  Occupational History  . Occupation: retired Therapist, sports  Tobacco Use  . Smoking status: Never Smoker  . Smokeless tobacco: Never Used  Substance and Sexual Activity  . Alcohol use: No  . Drug use: No  . Sexual activity: Not on file  Other Topics Concern  . 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   3 sons, November 07, 2015 1 son died in his sleep unclear cause   Updated 12/02/2015   Social Determinants of Health   Financial Resource Strain: Not on file  Food Insecurity: Not on file  Transportation Needs: Not on  file  Physical Activity: Not on file  Stress: Not on file  Social Connections: Not on file     Family History: The patient's family history includes Esophageal cancer in her father; Hypertension in her mother. There is no history of Colon polyps, Colon cancer, Rectal cancer, or Stomach cancer.  ROS:   Please see the history of present illness.     All other systems reviewed and are negative.  EKGs/Labs/Other Studies Reviewed:    The following studies were reviewed today:   EKG:  EKG is ordered today.  The ekg ordered today demonstrates normal sinus rhythm, rate 82, no ST/T abnormalities  Recent Labs: 01/22/2020: Magnesium 1.6; TSH 1.912 02/26/2020: ALT 18; BNP 93.8; Hemoglobin 10.3; Platelets 107 03/05/2020: BUN 11; Creatinine, Ser 0.42; Potassium 4.2; Sodium 139  Recent Lipid Panel    Component Value Date/Time   CHOL 125 01/23/2020 0222   TRIG 142 01/23/2020 0222   HDL 44 01/23/2020 0222   CHOLHDL 2.8 01/23/2020 0222   VLDL 28 01/23/2020 0222   LDLCALC 53 01/23/2020 0222    Physical Exam:    VS:  There were no vitals taken for this visit.    Wt Readings from Last 3 Encounters:  02/26/20 216 lb (98 kg)  02/02/20 204 lb (92.5 kg)  02/01/20 218 lb 6.4 oz (99.1 kg)     GEN: in no acute distress HEENT: Normal NECK: No JVD LYMPHATICS: No lymphadenopathy CARDIAC: RRR, 2 out of 6 systolic murmur RESPIRATORY:  Clear to auscultation without rales, wheezing or rhonchi  ABDOMEN: Soft, non-tender, non-distended MUSCULOSKELETAL: 2+ bilateral lower extremity edema SKIN: Warm and dry NEUROLOGIC:  Alert and oriented x 3 PSYCHIATRIC:  Normal affect   ASSESSMENT:    No diagnosis found. PLAN:    Paroxysmal atrial fibrillation: CHA2DS2-VASc score 3 (hypertension, age, female).  Lexiscan Myoview was done on 02/02/2020, which showed normal perfusion, EF 68% -Continue Eliquis 5 mg twice daily -Continue diltiazem CD 180 mg daily -Continue as needed flecainide -Follows with EP,  considering ablation  Chest pain: Atypical in description. Lexiscan Myoview was done on 02/02/2020, which showed normal perfusion, EF 68%.  No further work-up recommended  Aortic stenosis: Mild on echocardiogram on 01/23/2020.  Will monitor.  Hypertension: Appears controlled, continue diltiazem  Bilateral lower extremity edema: Lasix increased to 40 mg daily at last clinic appointment.  BNP 94, albumin 3.7 on 02/26/2020  Hyperlipidemia: Switch from simvastatin to atorvastatin given interaction between diltiazem and simvastatin.  RTC in ***    Medication Adjustments/Labs and Tests Ordered: Current medicines are reviewed at length with the patient today.  Concerns regarding medicines are outlined above.  No orders of the defined types were placed in this encounter.  No orders of the defined types were placed  in this encounter.   There are no Patient Instructions on file for this visit.   Signed, Donato Heinz, MD  03/25/2020 9:31 AM    [place]

## 2020-03-28 ENCOUNTER — Telehealth: Payer: Self-pay | Admitting: Cardiology

## 2020-03-28 NOTE — Telephone Encounter (Signed)
Patient is requesting to schedule a sleep study. She would like a call back to further discuss the process.

## 2020-03-28 NOTE — Telephone Encounter (Signed)
Attempted to call patient, unable to leave message at this time.

## 2020-03-29 ENCOUNTER — Ambulatory Visit: Payer: Medicare Other | Admitting: Cardiology

## 2020-04-03 ENCOUNTER — Ambulatory Visit (HOSPITAL_COMMUNITY): Payer: Medicare Other | Admitting: Physician Assistant

## 2020-04-03 NOTE — Telephone Encounter (Signed)
Left message for patient, sleep study can be discussed at appointment Friday this week with dr Gardiner Rhyme. She is to call back with questions prior to that appointment.

## 2020-04-05 ENCOUNTER — Telehealth: Payer: Self-pay | Admitting: Cardiology

## 2020-04-05 ENCOUNTER — Telehealth: Payer: Medicare Other | Admitting: Cardiology

## 2020-04-05 NOTE — Progress Notes (Deleted)
Cardiology Office Note:    Date:  04/05/2020   ID:  Savannah Hobbs, DOB 08-26-1945, MRN [id number]  PCP:  Moshe Cipro, MD  Cardiologist:  Donato Heinz, MD  Electrophysiologist:  Thompson Grayer, MD   Referring MD: Moshe Cipro, MD   Chief Complaint: follow-up of atrial fibrillation/flutter and lower extremity edema  History of Present Illness:    Savannah Hobbs is a 75 y.o. 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 Gardiner Rhyme and presents today for follow-up of atrial fibrillation/flutter.   Patient lives in [place], [place] and previously followed at an another health system. She was diagnosed with COVID in 12/2018 and had an episode of atrial fibrillation while recovering from this that spontaneously resolved. She had a second episode of atrial fibrillation in 03/2019 after she received her 1st dose of the Moderna vaccine. She was seen by a Cardiologist in [place] who started her on Lopressor but was not started on anticoagulation given her thrombocytopenia.   She was admitted to [facility] in 01/2020 for atrial flutter with RVR after presenting with palpitations, dyspnea on exertion, and mild chest pressure. Heart rates were as high as the 170's. She was started on IV Cardizem and converted back to sinus rhythm. Chest CTA was negative for PE but did show liver cirrhosis. Echo showed LVEF of 60-65% with normal wall motion, mild AS, and mild MR. She was transitioned to PO Cardizem and started on Eliquis. Outpatient Lexiscan Myoview was ordered for further evaluation of atypical chest pain and was low risk with no evidence of prior infarct or ischemia. Patient was referred to EP to discuss possibility of ablation. She was seen by Dr. Rayann Heman and risks and benfits of EP study and radiofrequency ablation were discussed.  Patient wanted to think about this further. She was advised to contact Dr. Rayann Heman if she decides to proceed. She was started on "pill in pocket" Flecainide 200mg  for treatment of atrial fibrillation in the interim. She reported snoring so a sleep study was ordered to rule out sleep apnea.  Patient was last seen by Dr. Gardiner Rhyme on 02/26/2020 at which time she reported occasional palpitations that lasted for about 5 minutes but no recurrence of atrial fibrillation. She also noted rare chest pain and intermittent dyspnea. Her lower extremity had worsened. Chest pain was felt to be atypical and given normal Myoview in 01/2020 no additional work-up felt to be necessary. BNP was checked but was normal. Lasix was increased to 40mg  daily. Simvastatin was switched to Lipitor given interaction between Simvastatin and Diltiazem. CBC and CMP were also ordered and WBC was elevated at 22,000. Patient was advised to follow-up with PCP. She was advised to follow-up in 1 month.  Patient presents today for follow-up. ***  Paroxysmal Atrial Fibrillation/Flutter - *** - Continue Cardizem CD 180mg  daily.  - Continue pill in pocket Flecainide as needed.  - CHA2DS2-VASc = 3 (HTN, age, sex). Continue Eliquis 5mg  twice daily.   Chest Pain - *** - Echo in 01/2020 showed normal LVEF and normal wall motion.  - Myoview in 01/2020 was low risk with no evidence or infarct or ischemia.  - No further work-up necessary at this time.  Mild Aortic Stenosis - Noted on Echo in 01/2020.  - Will continue to monitor. Patient should have repeat Echo in 3-5 years unless new/progressive symptoms warrant this earlier.  Hypertension  Hyperlipidemia - Simvastatin  switched to Lipitor 20mg  daily at last visit given Simvastatin's interaction with Cardizem. - Can repeat lipid panel and LFTs in 2-3 months.  Bilateral Lower Extremity Edema - Improved with increase dose of Lasix. *** - Continue Lasix 40mg  daily (and K-Dur 29mEq) daily.    Past Medical History:  Diagnosis Date  . Allergy   . Arthritis   . Asthma   . Cirrhosis of liver ([place])    due to "medications for arthritis".   . Elevated cholesterol   . Erosive esophagitis 1995  . Fatty liver   . GERD (gastroesophageal reflux disease)   . Hiatal hernia    LARGE  . Hx of colonic polyps 05/27/2018  . Hx of transfusion of platelets   . Paroxysmal atrial fibrillation ([facility])   . Thrombocytopenia ([place])   . Typical atrial flutter ([place])   . UTI (lower urinary tract infection)     Past Surgical History:  Procedure Laterality Date  . ABDOMINAL HYSTERECTOMY     partial  . APPENDECTOMY    . COLONOSCOPY    . ESOPHAGOGASTRODUODENOSCOPY    . GYNECOLOGIC CRYOSURGERY     x2  . REPLACEMENT TOTAL KNEE Left 2016  . TONSILLECTOMY    . TOTAL KNEE ARTHROPLASTY Right   . TUBAL LIGATION    . UPPER GASTROINTESTINAL ENDOSCOPY      Current Medications: No outpatient medications have been marked as taking for the 04/09/20 encounter (Appointment) with Darreld Mclean, PA-C.     Allergies:   Iodine and Shellfish allergy   Social History   Socioeconomic History  . Marital status: Married    Spouse name: Not on file  . Number of children: 3  . Years of education: Not on file  . Highest education level: Not on file  Occupational History  . Occupation: retired Therapist, sports  Tobacco Use  . Smoking status: Never Smoker  . Smokeless tobacco: Never Used  Substance and Sexual Activity  . Alcohol use: No  . Drug use: No  . Sexual activity: Not on file  Other Topics Concern  . 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   3 sons, 11-11-15 1 son died in his sleep unclear cause   Updated 12/02/2015   Social Determinants of Health   Financial Resource Strain: Not on file  Food Insecurity: Not on file  Transportation Needs: Not on file  Physical Activity: Not on file  Stress: Not on file  Social  Connections: Not on file     Family History: The patient's ***family history includes Esophageal cancer in her father; Hypertension in her mother. There is no history of Colon polyps, Colon cancer, Rectal cancer, or Stomach cancer.  ROS:   Please see the history of present illness.    *** All other systems reviewed and are negative.  EKGs/Labs/Other Studies Reviewed:    The following studies were reviewed today:  Echocardiogram 01/23/2020: Impressions: 1. Left ventricular ejection fraction, by estimation, is 60 to 65%. The  left ventricle has normal function. The left ventricle has no regional  wall motion abnormalities. Left ventricular diastolic function could not  be evaluated.  2. Right ventricular systolic function is normal. The right ventricular  size is normal.  3. Left atrial size was moderately dilated.  4. The trivial pericardial effusion is posterior to the left ventricle.  5. The mitral valve is abnormal. Mild mitral valve regurgitation.  6. The aortic valve is tricuspid.  There is moderate calcification of the  aortic valve. Aortic valve regurgitation is not visualized. Mild aortic  valve stenosis. Aortic valve area, by VTI measures 1.59 cm. Aortic valve  mean gradient measures 11.0 mmHg.  Aortic valve Vmax measures 2.15 m/s.  7. The inferior vena cava is normal in size with <50% respiratory  variability, suggesting right atrial pressure of 8 mmHg.   Comparison(s): No prior Echocardiogram.  _______________  Myoview 02/02/2020:  The left ventricular ejection fraction is hyperdynamic (>65%).  Nuclear stress EF: 68%.  There was no ST segment deviation noted during stress.  The study is normal.  This is a low risk study.   Normal pharmacologic nuclear study with no evidence for prior infarct or ischemia. Normal LVEF.   EKG:  EKG *** ordered today. EKG personally reviewed and demonstrates ***.  Recent Labs: 01/22/2020: Magnesium 1.6; TSH  1.912 02/26/2020: ALT 18; BNP 93.8; Hemoglobin 10.3; Platelets 107 03/05/2020: BUN 11; Creatinine, Ser 0.42; Potassium 4.2; Sodium 139  Recent Lipid Panel    Component Value Date/Time   CHOL 125 01/23/2020 0222   TRIG 142 01/23/2020 0222   HDL 44 01/23/2020 0222   CHOLHDL 2.8 01/23/2020 0222   VLDL 28 01/23/2020 0222   LDLCALC 53 01/23/2020 0222    Physical Exam:    Vital Signs: There were no vitals taken for this visit.    Wt Readings from Last 3 Encounters:  02/26/20 216 lb (98 kg)  02/02/20 204 lb (92.5 kg)  02/01/20 218 lb 6.4 oz (99.1 kg)     General: 75 y.o. female in no acute distress. HEENT: Normocephalic and atraumatic. Sclera clear. EOMs intact. Neck: Supple. No carotid bruits. No JVD. Heart: *** RRR. Distinct S1 and S2. No murmurs, gallops, or rubs. Radial and distal pedal pulses 2+ and equal bilaterally. Lungs: No increased work of breathing. Clear to ausculation bilaterally. No wheezes, rhonchi, or rales.  Abdomen: Soft, non-distended, and non-tender to palpation. Bowel sounds present in all 4 quadrants.  MSK: Normal strength and tone for age. *** Extremities: No lower extremity edema.    Skin: Warm and dry. Neuro: Alert and oriented x3. No focal deficits. Psych: Normal affect. Responds appropriately.   Assessment:    No diagnosis found.  Plan:     Disposition: Follow up in ***   Medication Adjustments/Labs and Tests Ordered: Current medicines are reviewed at length with the patient today.  Concerns regarding medicines are outlined above.  No orders of the defined types were placed in this encounter.  No orders of the defined types were placed in this encounter.   There are no Patient Instructions on file for this visit.   Signed, Darreld Mclean, PA-C  04/05/2020 9:26 PM    [facility]

## 2020-04-05 NOTE — Telephone Encounter (Signed)
Patient calling the office for samples of medication:   1.  What medication and dosage are you requesting samples for?  apixaban (ELIQUIS) 5 MG TABS tablet    2.  Are you currently out of this medication? Has enough to last until appt date, is requesting to pick up samples at appt.

## 2020-04-05 NOTE — Telephone Encounter (Signed)
Received fax from [facility] patients insurance info missing (on initial application).    Refaxed insurance information.

## 2020-04-05 NOTE — Telephone Encounter (Signed)
Per DPR left message on cell phone stating samples would be waiting for pt at check in desk on the day of her appt. Advised to call us back if she has any questions.

## 2020-04-08 ENCOUNTER — Ambulatory Visit (HOSPITAL_COMMUNITY): Payer: Medicare Other | Admitting: Physician Assistant

## 2020-04-09 ENCOUNTER — Ambulatory Visit: Payer: Medicare Other | Admitting: Student

## 2020-04-16 ENCOUNTER — Other Ambulatory Visit: Payer: Self-pay

## 2020-04-16 ENCOUNTER — Encounter (HOSPITAL_COMMUNITY): Payer: Self-pay | Admitting: Physician Assistant

## 2020-04-16 ENCOUNTER — Ambulatory Visit (HOSPITAL_COMMUNITY)
Admission: RE | Admit: 2020-04-16 | Discharge: 2020-04-16 | Disposition: A | Discharge status: Home / Self Care | Payer: Medicare Other | Source: Ambulatory Visit | Attending: Physician Assistant | Admitting: Physician Assistant

## 2020-04-16 VITALS — BP 132/58 | HR 78 | Ht 65.0 in | Wt 208.2 lb

## 2020-04-16 DIAGNOSIS — Z7901 Long term (current) use of anticoagulants: Secondary | ICD-10-CM | POA: Diagnosis not present

## 2020-04-16 DIAGNOSIS — Z79899 Other long term (current) drug therapy: Secondary | ICD-10-CM | POA: Diagnosis not present

## 2020-04-16 DIAGNOSIS — E669 Obesity, unspecified: Secondary | ICD-10-CM | POA: Diagnosis not present

## 2020-04-16 DIAGNOSIS — Z6834 Body mass index (BMI) 34.0-34.9, adult: Secondary | ICD-10-CM | POA: Insufficient documentation

## 2020-04-16 DIAGNOSIS — E785 Hyperlipidemia, unspecified: Secondary | ICD-10-CM | POA: Insufficient documentation

## 2020-04-16 DIAGNOSIS — D6869 Other thrombophilia: Secondary | ICD-10-CM

## 2020-04-16 DIAGNOSIS — I480 Paroxysmal atrial fibrillation: Secondary | ICD-10-CM | POA: Diagnosis present

## 2020-04-16 DIAGNOSIS — I4892 Unspecified atrial flutter: Secondary | ICD-10-CM | POA: Diagnosis not present

## 2020-04-16 MED ORDER — DILTIAZEM HCL ER COATED BEADS 120 MG PO CP24: 120.0000 mg | ORAL_CAPSULE | Freq: Every day | ORAL | 3 refills | Status: DC

## 2020-04-16 NOTE — Progress Notes (Signed)
Patient is being seen in the Atrial Fibrillation Clinic. VS, EKG, and device interrogations performed in the clinic. Clint Fenton, PA is at home and seeing patients via telemedicine as he is in quarantine.  

## 2020-04-16 NOTE — Patient Instructions (Signed)
Decrease cardizem to 120mg  once a day  Follow up with Dr. Gardiner Rhyme in 2-3 months

## 2020-04-16 NOTE — Progress Notes (Signed)
Primary Care Physician: Moshe Cipro, MD Primary Cardiologist: Dr Gardiner Rhyme  Primary Electrophysiologist: Dr Rayann Heman Referring Physician: Dr Onalee Hua Sanay Savannah Hobbs is a 75 y.o. female with a history of atrial fibrillation, cirrhosis, hyperlipidemia, and atrial flutter who presents for follow up in the [facility].  The patient was initially diagnosed with atrial fibrillation in the setting of COVID infection 12/2018. Patient was hospitalized for recurrent atrial flutter 01/2020. She was seen by Dr Rayann Heman and started on PRN flecainide. Patient is on Eliquis for a CHADS2VASC score of 2. She has done well since her last visit with no heart racing or palpitations. She has not had to take any PRN flecainide. She does report fatigue since starting diltiazem.   Today, she denies symptoms of palpitations, chest pain, shortness of breath, orthopnea, PND, lower extremity edema, dizziness, presyncope, syncope, bleeding, or neurologic sequela. The patient is tolerating medications without difficulties and is otherwise without complaint today.    Atrial Fibrillation Risk Factors:  she does have symptoms or diagnosis of sleep apnea. Sleep study pending. she does not have a history of rheumatic fever.   she has a BMI of Body mass index is 34.65 kg/m.Marland Kitchen Filed Weights   04/16/20 1431  Weight: 94.4 kg    Family History  Problem Relation Age of Onset  . Esophageal cancer Father   . Hypertension Mother   . Colon polyps Neg Hx   . Colon cancer Neg Hx   . Rectal cancer Neg Hx   . Stomach cancer Neg Hx      Atrial Fibrillation Management history:  Previous antiarrhythmic drugs: flecainide PRN Previous cardioversions: none Previous ablations: none CHADS2VASC score: 2 Anticoagulation history: Eliquis   Past Medical History:  Diagnosis Date  . Allergy   . Arthritis   . Asthma   . Cirrhosis of liver ([place])    due to "medications for arthritis".   .  Elevated cholesterol   . Erosive esophagitis 1995  . Fatty liver   . GERD (gastroesophageal reflux disease)   . Hiatal hernia    LARGE  . Hx of colonic polyps 05/27/2018  . Hx of transfusion of platelets   . Paroxysmal atrial fibrillation ([facility])   . Thrombocytopenia ([place])   . Typical atrial flutter ([place])   . UTI (lower urinary tract infection)    Past Surgical History:  Procedure Laterality Date  . ABDOMINAL HYSTERECTOMY     partial  . APPENDECTOMY    . COLONOSCOPY    . ESOPHAGOGASTRODUODENOSCOPY    . GYNECOLOGIC CRYOSURGERY     x2  . REPLACEMENT TOTAL KNEE Left 2016  . TONSILLECTOMY    . TOTAL KNEE ARTHROPLASTY Right   . TUBAL LIGATION    . UPPER GASTROINTESTINAL ENDOSCOPY      Current Outpatient Medications  Medication Sig Dispense Refill  . apixaban (ELIQUIS) 5 MG TABS tablet Take 1 tablet (5 mg total) by mouth 2 (two) times daily. 180 tablet 1  . Ascorbic Acid (VITAMIN C) 1000 MG tablet Take 1,000 mg by mouth daily.    Marland Kitchen atorvastatin (LIPITOR) 20 MG tablet Take 1 tablet (20 mg total) by mouth daily. 90 tablet 3  . cetirizine (ZYRTEC) 10 MG tablet Take 10 mg by mouth in the morning.    . Cholecalciferol (VITAMIN D-3) 25 MCG (1000 UT) CAPS Take 1,000 Units by mouth daily.    Marland Kitchen diltiazem (CARDIZEM CD) 180 MG 24 hr capsule Take 1 capsule (180 mg total) by mouth  daily. 90 capsule 3  . docusate sodium (COLACE) 100 MG capsule Take 100 mg by mouth in the morning.     Marland Kitchen esomeprazole (NEXIUM) 20 MG capsule Take 20 mg by mouth daily before breakfast.    . flecainide (TAMBOCOR) 100 MG tablet Take 2 tablets (200 mg total) by mouth as directed. Take 2 tablets as needed daily for Afib 10 tablet 1  . furosemide (LASIX) 40 MG tablet Take 1 tablet (40 mg total) by mouth daily. 90 tablet 3  . ibuprofen (ADVIL) 200 MG tablet Take 200 mg by mouth every 6 (six) hours as needed for mild pain (or headaches).    . potassium chloride SA (KLOR-CON) 20 MEQ tablet Take 1 tablet (20 mEq total) by mouth  daily. 90 tablet 3  . vitamin B-12 (CYANOCOBALAMIN) 1000 MCG tablet Take 1,000 mcg by mouth daily.     No current facility-administered medications for this encounter.    Allergies  Allergen Reactions  . Iodine Hives  . Shellfish Allergy Hives    Social History   Socioeconomic History  . Marital status: Married    Spouse name: Not on file  . Number of children: 3  . Years of education: Not on file  . Highest education level: Not on file  Occupational History  . Occupation: retired Therapist, sports  Tobacco Use  . Smoking status: Never Smoker  . Smokeless tobacco: Never Used  Substance and Sexual Activity  . Alcohol use: Yes    Alcohol/week: 1.0 standard drink    Types: 1 Glasses of wine per week  . Drug use: No  . Sexual activity: Not on file  Other Topics Concern  . 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   3 sons, 11/09/2015 1 son died in his sleep unclear cause   Updated 12/02/2015   Social Determinants of Health   Financial Resource Strain: Not on file  Food Insecurity: Not on file  Transportation Needs: Not on file  Physical Activity: Not on file  Stress: Not on file  Social Connections: Not on file  Intimate Partner Violence: Not on file     ROS- All systems are reviewed and negative except as per the HPI above.  Physical Exam: Vitals:   04/16/20 1431  BP: (!) 132/58  Pulse: 78  Weight: 94.4 kg  Height: 5\' 5"  (1.651 m)    Exam: Well appearing, alert and conversant, regular work of breathing,  good skin color Eyes- anicteric, neuro- grossly intact, skin- no apparent rash or lesions or cyanosis, mouth- oral mucosa is pink   Wt Readings from Last 3 Encounters:  04/16/20 94.4 kg  02/26/20 98 kg  02/02/20 92.5 kg    EKG today demonstrates  SR Vent. rate 78 BPM PR interval 158 ms QRS duration 94 ms QT/QTc 398/453 ms  Echo 01/23/20 demonstrated  1. Left ventricular  ejection fraction, by estimation, is 60 to 65%. The  left ventricle has normal function. The left ventricle has no regional  wall motion abnormalities. Left ventricular diastolic function could not  be evaluated.  2. Right ventricular systolic function is normal. The right ventricular  size is normal.  3. Left atrial size was moderately dilated.  4. The trivial pericardial effusion is posterior to the left ventricle.  5. The mitral valve is abnormal. Mild mitral valve regurgitation.  6. The aortic valve is tricuspid. There is moderate calcification of the  aortic valve. Aortic  valve regurgitation is not visualized. Mild aortic  valve stenosis. Aortic valve area, by VTI measures 1.59 cm. Aortic valve  mean gradient measures 11.0 mmHg.  Aortic valve Vmax measures 2.15 m/s.  7. The inferior vena cava is normal in size with <50% respiratory  variability, suggesting right atrial pressure of 8 mmHg.   Epic records are reviewed at length today  CHA2DS2-VASc Score = 4  The patient's score is based upon: CHF History: No HTN History: Yes Diabetes History: No Stroke History: No Vascular Disease History: Yes Age Score: 1 Gender Score: 1      ASSESSMENT AND PLAN: 1. Paroxysmal Atrial Fibrillation/atrial flutter The patient's CHA2DS2-VASc score is 4, indicating a 4.8% annual risk of stroke.   Patient appears to be maintaining SR. Continue flecainide 200 mg daily PRN for afib. Decrease diltiazem to 120 mg daily Continue Eliquis 5 mg BID Could consider ablation in the future if she fails flecainide.   2. Secondary Hypercoagulable State (ICD10:  D68.69) The patient is at significant risk for stroke/thromboembolism based upon her CHA2DS2-VASc Score of 4.  Continue Apixaban (Eliquis).   3. Obesity Body mass index is 34.65 kg/m. Lifestyle modification was discussed at length including regular exercise and weight reduction.  4. Suspected OSA The importance of adequate treatment of  sleep apnea was discussed today in order to improve our ability to maintain sinus rhythm long term. Sleep study pending.   Follow up with Dr Gardiner Rhyme in 3 months. AF clinic in 6 months.    [facility] [address] [place], [place] [address] [phone] 04/16/2020 2:42 PM

## 2020-04-16 NOTE — Addendum Note (Signed)
Encounter addended by: Juluis Mire, RN on: 04/16/2020 4:13 PM  Actions taken: Clinical Note Signed

## 2020-06-07 ENCOUNTER — Ambulatory Visit: Payer: Medicare Other | Admitting: Internal Medicine

## 2020-07-23 ENCOUNTER — Other Ambulatory Visit: Payer: Self-pay

## 2020-07-23 ENCOUNTER — Encounter: Payer: Self-pay | Admitting: Cardiology

## 2020-07-23 ENCOUNTER — Ambulatory Visit (INDEPENDENT_AMBULATORY_CARE_PROVIDER_SITE_OTHER): Payer: Medicare Other | Admitting: Cardiology

## 2020-07-23 VITALS — BP 138/72 | HR 83 | Ht 65.0 in | Wt 204.8 lb

## 2020-07-23 DIAGNOSIS — I5032 Chronic diastolic (congestive) heart failure: Secondary | ICD-10-CM | POA: Diagnosis not present

## 2020-07-23 DIAGNOSIS — I480 Paroxysmal atrial fibrillation: Secondary | ICD-10-CM | POA: Diagnosis not present

## 2020-07-23 DIAGNOSIS — I350 Nonrheumatic aortic (valve) stenosis: Secondary | ICD-10-CM

## 2020-07-23 DIAGNOSIS — R079 Chest pain, unspecified: Secondary | ICD-10-CM

## 2020-07-23 NOTE — Patient Instructions (Signed)
Medication Instructions:  For 3 days, increase Lasix to 40mg  twice daily. At the end of the week, call the office to let Dr.Schumann know how this has worked for you.   *If you need a refill on your cardiac medications before your next appointment, please call your pharmacy*   Lab Work: CMET, Magnesium, BNP, CBC to be drawn today.  If you have labs (blood work) drawn today and your tests are completely normal, you will receive your results only by: Marland Kitchen MyChart Message (if you have MyChart) OR . A paper copy in the mail If you have any lab test that is abnormal or we need to change your treatment, we will call you to review the results.   Testing/Procedures: None ordered   Follow-Up: At [facility], you and your health needs are our priority.  As part of our continuing mission to provide you with exceptional heart care, we have created designated Provider Care Teams.  These Care Teams include your primary Cardiologist (physician) and Advanced Practice Providers (APPs -  Physician Assistants and Nurse Practitioners) who all work together to provide you with the care you need, when you need it.  We recommend signing up for the patient portal called "MyChart".  Sign up information is provided on this After Visit Summary.  MyChart is used to connect with patients for Virtual Visits (Telemedicine).  Patients are able to view lab/test results, encounter notes, upcoming appointments, etc.  Non-urgent messages can be sent to your provider as well.   To learn more about what you can do with MyChart, go to [email or web address].    Your next appointment:   3 month(s)  The format for your next appointment:   In Person  Provider:   Oswaldo Milian, MD

## 2020-07-23 NOTE — Progress Notes (Signed)
Cardiology Office Note:    Date:  07/23/2020   ID:  Savannah Hobbs, Savannah Hobbs 1945/09/27, MRN [id number]  PCP:  Moshe Cipro, MD  Cardiologist:  Donato Heinz, MD  Electrophysiologist:  Thompson Grayer, MD   Referring MD: Moshe Cipro, MD   Chief Complaint  Patient presents with  . Atrial Fibrillation    History of Present Illness:    Savannah Hobbs is a 75 y.o. female with a hx of paroxysmal atrial fibrillation, cirrhosis, hyperlipidemia who presents for follow-up.  She was diagnosed with COVID-19 in October 2020.  Had an episode of atrial fibrillation while recovering from this.  In January 2021 she had a second episode of atrial fibrillation that occurred after she received her first dose of the Covid vaccine.  She initially followed with cardiologist in [place] and was started on metoprolol.  She was not started on anticoagulation.  She presented to [facility] on 01/22/2020 with A. fib with RVR.  Echocardiogram showed normal biventricular function, mild MR, mild AS.  She was started on diltiazem drip for rate control and converted to normal sinus rhythm.  She was switched to p.o. diltiazem.  Eliquis 5 mg twice daily was started given CHA2DS2-VASc 4.  She reported having chest pain and a Lexiscan Myoview was done on 02/02/2020, which showed normal perfusion, EF 68%.  She saw Dr. Rayann Heman, considering ablation.  Recommended flecainide 200 mg daily as pill in pocket treatment for A. Fib.  Referred for sleep study.    Since last clinic appointment, she reports she has been doing okay.  Did have 1 episode of chest pain, described as left-sided dull aching pain that lasted for 1 to 2 minutes.  Occurred at rest.  Does report has been having some dyspnea on exertion.  She has been having palpitations every few weeks, lasting for 1 to 2 minutes.  She has not had to use flecainide.  Did have 1 episode of lightheadedness, but denies any presyncope.  Does report persistent lower extremity  edema.  She has been taking Eliquis, denies any bleeding issues.      Wt Readings from Last 3 Encounters:  07/23/20 204 lb 12.8 oz (92.9 kg)  04/16/20 208 lb 3.2 oz (94.4 kg)  02/26/20 216 lb (98 kg)     Past Medical History:  Diagnosis Date  . Allergy   . Arthritis   . Asthma   . Cirrhosis of liver ([place])    due to "medications for arthritis".   . Elevated cholesterol   . Erosive esophagitis 1995  . Fatty liver   . GERD (gastroesophageal reflux disease)   . Hiatal hernia    LARGE  . Hx of colonic polyps 05/27/2018  . Hx of transfusion of platelets   . Paroxysmal atrial fibrillation ([facility])   . Thrombocytopenia ([place])   . Typical atrial flutter ([place])   . UTI (lower urinary tract infection)     Past Surgical History:  Procedure Laterality Date  . ABDOMINAL HYSTERECTOMY     partial  . APPENDECTOMY    . COLONOSCOPY    . ESOPHAGOGASTRODUODENOSCOPY    . GYNECOLOGIC CRYOSURGERY     x2  . REPLACEMENT TOTAL KNEE Left 2016  . TONSILLECTOMY    . TOTAL KNEE ARTHROPLASTY Right   . TUBAL LIGATION    . UPPER GASTROINTESTINAL ENDOSCOPY      Current Medications: Current Meds  Medication Sig  . apixaban (ELIQUIS) 5 MG TABS tablet Take 1 tablet (5 mg total) by mouth  2 (two) times daily.  . Ascorbic Acid (VITAMIN C) 1000 MG tablet Take 1,000 mg by mouth daily.  . cetirizine (ZYRTEC) 10 MG tablet Take 10 mg by mouth in the morning.  . Cholecalciferol (VITAMIN D-3) 25 MCG (1000 UT) CAPS Take 1,000 Units by mouth daily.  Marland Kitchen diltiazem (CARDIZEM CD) 120 MG 24 hr capsule Take 1 capsule (120 mg total) by mouth daily.  Marland Kitchen docusate sodium (COLACE) 100 MG capsule Take 100 mg by mouth in the morning.   Marland Kitchen esomeprazole (NEXIUM) 20 MG capsule Take 20 mg by mouth daily before breakfast.  . ferrous sulfate 325 (65 FE) MG tablet Take 325 mg by mouth daily.  . flecainide (TAMBOCOR) 100 MG tablet Take 2 tablets (200 mg total) by mouth as directed. Take 2 tablets as needed daily for Afib  . furosemide  (LASIX) 40 MG tablet Take 1 tablet (40 mg total) by mouth daily.  Marland Kitchen ibuprofen (ADVIL) 200 MG tablet Take 200 mg by mouth every 6 (six) hours as needed for mild pain (or headaches).  . vitamin B-12 (CYANOCOBALAMIN) 1000 MCG tablet Take 1,000 mcg by mouth daily.     Allergies:   Iodine and Shellfish allergy   Social History   Socioeconomic History  . Marital status: Married    Spouse name: Not on file  . Number of children: 3  . Years of education: Not on file  . Highest education level: Not on file  Occupational History  . Occupation: retired Therapist, sports  Tobacco Use  . Smoking status: Never Smoker  . Smokeless tobacco: Never Used  Substance and Sexual Activity  . Alcohol use: Yes    Alcohol/week: 1.0 standard drink    Types: 1 Glasses of wine per week  . Drug use: No  . Sexual activity: Not on file  Other Topics Concern  . 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   3 sons, 2015-11-21 1 son died in his sleep unclear cause   Updated 12/02/2015   Social Determinants of Health   Financial Resource Strain: Not on file  Food Insecurity: Not on file  Transportation Needs: Not on file  Physical Activity: Not on file  Stress: Not on file  Social Connections: Not on file     Family History: The patient's family history includes Esophageal cancer in her father; Hypertension in her mother. There is no history of Colon polyps, Colon cancer, Rectal cancer, or Stomach cancer.  ROS:   Please see the history of present illness.     All other systems reviewed and are negative.  EKGs/Labs/Other Studies Reviewed:    The following studies were reviewed today:   EKG:  EKG is ordered today.  The ekg ordered today demonstrates normal sinus rhythm, rate 83, no ST/T abnormalities  Recent Labs: 01/22/2020: Magnesium 1.6; TSH 1.912 02/26/2020: ALT 18; BNP 93.8; Hemoglobin 10.3; Platelets 107 03/05/2020: BUN 11;  Creatinine, Ser 0.42; Potassium 4.2; Sodium 139  Recent Lipid Panel    Component Value Date/Time   CHOL 125 01/23/2020 0222   TRIG 142 01/23/2020 0222   HDL 44 01/23/2020 0222   CHOLHDL 2.8 01/23/2020 0222   VLDL 28 01/23/2020 0222   LDLCALC 53 01/23/2020 0222    Physical Exam:    VS:  BP 138/72   Pulse 83   Ht 5\' 5"  (1.651 m)   Wt 204 lb 12.8 oz (92.9 kg)   SpO2 98%  BMI 34.08 kg/m     Wt Readings from Last 3 Encounters:  07/23/20 204 lb 12.8 oz (92.9 kg)  04/16/20 208 lb 3.2 oz (94.4 kg)  02/26/20 216 lb (98 kg)     GEN: in no acute distress HEENT: Normal NECK: No JVD CARDIAC: RRR, 2 out of 6 systolic murmur RESPIRATORY:  Clear to auscultation without rales, wheezing or rhonchi  ABDOMEN: Soft, non-tender, non-distended MUSCULOSKELETAL: 2+ bilateral lower extremity edema SKIN: Warm and dry NEUROLOGIC:  Alert and oriented x 3 PSYCHIATRIC:  Normal affect   ASSESSMENT:    1. Chronic diastolic heart failure (HCC)   2. Paroxysmal atrial fibrillation (HCC)   3. Chest pain of uncertain etiology   4. Mild aortic stenosis    PLAN:    Paroxysmal atrial fibrillation: CHA2DS2-VASc score 3 (hypertension, age, female).  Lexiscan Myoview was done on 02/02/2020, which showed normal perfusion, EF 68% -Continue Eliquis 5 mg twice daily -Continue diltiazem CD 120 mg daily -Continue as needed flecainide -Follows with EP, considering ablation  Chest pain: Atypical in description. Lexiscan Myoview was done on 02/02/2020, which showed normal perfusion, EF 68%.  No further work-up recommended  Aortic stenosis: Mild on echocardiogram on 01/23/2020.  Will monitor.  Hypertension: Appears controlled, continue diltiazem  Chronic diastolic heart failure: On Lasix 40 mg.  Weight is down 12 pounds from prior admission but continues to have significant lower extremity edema.  Continue Lasix 40 mg daily, will give trial of 40 mg twice daily x3 days.  Check CMP, BNP,  magnesium  Hyperlipidemia: On atorvastatin 20 mg daily  RTC in 3 months   Medication Adjustments/Labs and Tests Ordered: Current medicines are reviewed at length with the patient today.  Concerns regarding medicines are outlined above.  No orders of the defined types were placed in this encounter.  No orders of the defined types were placed in this encounter.   There are no Patient Instructions on file for this visit.   Signed, Donato Heinz, MD  07/23/2020 3:47 PM    [facility]

## 2020-07-24 LAB — COMPREHENSIVE METABOLIC PANEL
ALT: 17 IU/L (ref 0–32)
AST: 44 IU/L — ABNORMAL HIGH (ref 0–40)
Albumin/Globulin Ratio: 1 — ABNORMAL LOW (ref 1.2–2.2)
Albumin: 3.8 g/dL (ref 3.7–4.7)
Alkaline Phosphatase: 159 IU/L — ABNORMAL HIGH (ref 44–121)
BUN/Creatinine Ratio: 30 — ABNORMAL HIGH (ref 12–28)
BUN: 11 mg/dL (ref 8–27)
Bilirubin Total: 1 mg/dL (ref 0.0–1.2)
CO2: 25 mmol/L (ref 20–29)
Calcium: 8.9 mg/dL (ref 8.7–10.3)
Chloride: 104 mmol/L (ref 96–106)
Creatinine, Ser: 0.37 mg/dL — ABNORMAL LOW (ref 0.57–1.00)
Globulin, Total: 3.9 g/dL (ref 1.5–4.5)
Glucose: 57 mg/dL — ABNORMAL LOW (ref 65–99)
Potassium: 4.1 mmol/L (ref 3.5–5.2)
Sodium: 143 mmol/L (ref 134–144)
Total Protein: 7.7 g/dL (ref 6.0–8.5)
eGFR: 107 mL/min/{1.73_m2} (ref >59)

## 2020-07-24 LAB — MAGNESIUM: Magnesium: 1.7 mg/dL (ref 1.6–2.3)

## 2020-07-24 LAB — CBC
Hematocrit: 34.4 % (ref 34.0–46.6)
Hemoglobin: 10.9 g/dL — ABNORMAL LOW (ref 11.1–15.9)
MCH: 25.6 pg — ABNORMAL LOW (ref 26.6–33.0)
MCHC: 31.7 g/dL (ref 31.5–35.7)
MCV: 81 fL (ref 79–97)
Platelets: 137 10*3/uL — ABNORMAL LOW (ref 150–450)
RBC: 4.25 x10E6/uL (ref 3.77–5.28)
RDW: 22.3 % — ABNORMAL HIGH (ref 11.7–15.4)
WBC: 24.2 10*3/uL (ref 3.4–10.8)

## 2020-07-24 LAB — BRAIN NATRIURETIC PEPTIDE: BNP: 75.2 pg/mL (ref 0.0–100.0)

## 2020-07-31 ENCOUNTER — Telehealth: Payer: Self-pay | Admitting: Cardiology

## 2020-07-31 NOTE — Telephone Encounter (Signed)
Called patient, seen last week- advised to increase Lasix 40 mg twice daily for 3 days, she states that it did help her and she feels much better. She was unsure if she should continue with twice daily. I did advise the note only suggested a trial of 3 days, and we would have her go back to once daily until notified by Dr.Schumann.  Patient verbalized understanding, thankful for call back- I did advise I could call if he wanted to make any other adjustments.

## 2020-07-31 NOTE — Telephone Encounter (Signed)
Pt c/o medication issue:  1. Name of Medication:  furosemide (LASIX) 40 MG tablet potassium chloride SA (KLOR-CON) 20 MEQ tablet(Expired)  2. How are you currently taking this medication (dosage and times per day)? 1 tablet twice a day for both, one tablet at 9am and another at 5 or 6 pm both  3. Are you having a reaction (difficulty breathing--STAT)? no  4. What is your medication issue? Patient states she was told to increase both lasix and potasium to 1 tablet twice a day for 2 day to help her swelling. She states she lost 8 lbs of fluid in 5 days and did continue taking it this way a day or 2 longer. She would like to know if she should continue taking it this way and if so she will need new prescriptions so she does not run out.

## 2020-07-31 NOTE — Telephone Encounter (Signed)
Would recommend going back to lasix once daily.  Monitor daily weights and if weight increased more than 3lbs in a day or 5lbs in a week, can increase to twice daily lasix

## 2020-08-01 NOTE — Telephone Encounter (Signed)
Spoke to patient-aware of recommendations and verbalized understanding.  

## 2020-08-13 ENCOUNTER — Telehealth: Payer: Self-pay | Admitting: Internal Medicine

## 2020-08-13 DIAGNOSIS — I4891 Unspecified atrial fibrillation: Secondary | ICD-10-CM

## 2020-08-13 NOTE — Telephone Encounter (Signed)
Patient states she has decided she would like to have the ablation and would like a call back to discuss it.

## 2020-08-13 NOTE — Telephone Encounter (Signed)
Spoke to the patient and advised I will speak with Dr. Rayann Heman tomorrow and get back with her.

## 2020-08-14 NOTE — Telephone Encounter (Signed)
Left message to call back  

## 2020-08-20 NOTE — Telephone Encounter (Signed)
Per Dr. Rayann Heman okay to schedule for afib ablation with pre op CT. Schedule in July and OV in mid June.  Spoke to the patient and set up ablation date. Will get pre op labs the day of office visit, give CT and ablation instructions.

## 2020-08-23 ENCOUNTER — Encounter: Payer: Self-pay | Admitting: *Deleted

## 2020-08-23 MED ORDER — METOPROLOL TARTRATE 25 MG PO TABS: 25.0000 mg | ORAL_TABLET | Freq: Once | ORAL | 0 refills | Status: DC | PRN

## 2020-08-23 NOTE — Addendum Note (Signed)
Addended by: Darrell Jewel on: 08/23/2020 02:29 PM   Modules accepted: Orders

## 2020-08-27 ENCOUNTER — Telehealth: Payer: Self-pay | Admitting: Internal Medicine

## 2020-08-27 NOTE — Telephone Encounter (Signed)
   Pt would would like to speak with Dr. Jackalyn Lombard nurse she said she has some questions about her upcoming procedure

## 2020-08-28 NOTE — Telephone Encounter (Signed)
Answered all questions and patient verbalized understanding.

## 2020-09-02 ENCOUNTER — Other Ambulatory Visit: Payer: Self-pay

## 2020-09-02 ENCOUNTER — Ambulatory Visit (INDEPENDENT_AMBULATORY_CARE_PROVIDER_SITE_OTHER): Payer: Medicare Other | Admitting: Internal Medicine

## 2020-09-02 ENCOUNTER — Encounter: Payer: Self-pay | Admitting: Internal Medicine

## 2020-09-02 VITALS — BP 142/70 | HR 75 | Ht 65.0 in | Wt 204.6 lb

## 2020-09-02 DIAGNOSIS — I4891 Unspecified atrial fibrillation: Secondary | ICD-10-CM

## 2020-09-02 DIAGNOSIS — I480 Paroxysmal atrial fibrillation: Secondary | ICD-10-CM | POA: Diagnosis not present

## 2020-09-02 DIAGNOSIS — D6869 Other thrombophilia: Secondary | ICD-10-CM | POA: Diagnosis not present

## 2020-09-02 NOTE — Progress Notes (Signed)
Patient Name: Savannah Hobbs        DOB: 10-27-2045      Height: 5'5"    Weight: 204  Office Name: [place]         Referring Provider: DR. Thompson Grayer  Today's Date: 09/02/20  Date:   STOP BANG RISK ASSESSMENT S (snore) Have you been told that you snore?     YES   T (tired) Are you often tired, fatigued, or sleepy during the day?   YES  O (obstruction) Do you stop breathing, choke, or gasp during sleep? NO   P (pressure) Do you have or are you being treated for high blood pressure? NO   B (BMI) Is your body index greater than 35 kg/m? NO   A (age) Are you 44 years old or older? YES   N (neck) Do you have a neck circumference greater than 16 inches?   NO   G (gender) Are you a female? NO   TOTAL STOP/BANG "YES" ANSWERS 3                                                                       For Office Use Only              Procedure Order Form    YES to 3+ Stop Bang questions OR two clinical symptoms - patient qualifies for WatchPAT (CPT 95800)             Clinical Notes: Will consult Sleep Specialist and refer for management of therapy due to patient increased risk of Sleep Apnea. Ordering a sleep study due to the following two clinical symptoms: Excessive daytime sleepiness G47.10 / Gastroesophageal reflux K21.9 / Nocturia R35.1 / Morning Headaches G44.221 / Difficulty concentrating R41.840 / Memory problems or poor judgment G31.84 / Personality changes or irritability R45.4 / Loud snoring R06.83 / Depression F32.9 / Unrefreshed by sleep G47.8 / Impotence N52.9 / History of high blood pressure R03.0 / Insomnia G47.00    I understand that I am proceeding with a home sleep apnea test as ordered by my treating physician. I understand that untreated sleep apnea is a serious cardiovascular risk factor and it is my responsibility to perform the test and seek management for sleep apnea. I will be contacted with the results and be managed for sleep apnea by a local sleep physician.  I will be receiving equipment and further instructions from [facility]. I shall promptly ship back the equipment via the included mailing label. I understand my insurance will be billed for the test and as the patient I am responsible for any insurance related out-of-pocket costs incurred. I have been provided with written instructions and can call for additional video or telephonic instruction, with 24-hour availability of qualified personnel to answer any questions: Patient Help Desk [phone].  Patient Signature ______________________________________________________   Date______________________ Patient Telemedicine Verbal Consent

## 2020-09-02 NOTE — Patient Instructions (Addendum)
Medication Instructions:  Your physician recommends that you continue on your current medications as directed. Please refer to the Current Medication list given to you today.  Labwork: BMP, CBC  Testing/Procedures: Your physician has recommended that you have a sleep study. This test records several body functions during sleep, including: brain activity, eye movement, oxygen and carbon dioxide blood levels, heart rate and rhythm, breathing rate and rhythm, the flow of air through your mouth and nose, snoring, body muscle movements, and chest and belly movement.  Your physician has requested that you have cardiac CT. Cardiac computed tomography (CT) is a painless test that uses an x-ray machine to take clear, detailed pictures of your heart. For further information please visit [email or web address]. Please follow instruction sheet as given.  Your physician has recommended that you have an ablation. Catheter ablation is a medical procedure used to treat some cardiac arrhythmias (irregular heartbeats). During catheter ablation, a long, thin, flexible tube is put into a blood vessel in your groin (upper thigh), or neck. This tube is called an ablation catheter. It is then guided to your heart through the blood vessel. Radio frequency waves destroy small areas of heart tissue where abnormal heartbeats may cause an arrhythmia to start. Please see the instruction sheet given to you today.   Any Other Special Instructions Will Be Listed Below (If Applicable).  If you need a refill on your cardiac medications before your next appointment, please call your pharmacy.   Cardiac Ablation Cardiac ablation is a procedure to destroy (ablate) some heart tissue that is sending bad signals. These bad signals causeproblems in heart rhythm. The heart has many areas that make these signals. If there are problems in these areas, they can make the heart beat in a way that is not normal.Destroying some tissues can help  make the heart rhythm normal. Tell your doctor about: Any allergies you have. All medicines you are taking. These include vitamins, herbs, eye drops, creams, and over-the-counter medicines. Any problems you or family members have had with medicines that make you fall asleep (anesthetics). Any blood disorders you have. Any surgeries you have had. Any medical conditions you have, such as kidney failure. Whether you are pregnant or may be pregnant. What are the risks? This is a safe procedure. But problems may occur, including: Infection. Bruising and bleeding. Bleeding into the chest. Stroke or blood clots. Damage to nearby areas of your body. Allergies to medicines or dyes. The need for a pacemaker if the normal system is damaged. Failure of the procedure to treat the problem. What happens before the procedure? Medicines Ask your doctor about: Changing or stopping your normal medicines. This is important. Taking aspirin and ibuprofen. Do not take these medicines unless your doctor tells you to take them. Taking other medicines, vitamins, herbs, and supplements. General instructions Follow instructions from your doctor about what you cannot eat or drink. Plan to have someone take you home from the hospital or clinic. If you will be going home right after the procedure, plan to have someone with you for 24 hours. Ask your doctor what steps will be taken to prevent infection. What happens during the procedure?  An IV tube will be put into one of your veins. You will be given a medicine to help you relax. The skin on your neck or groin will be numbed. A cut (incision) will be made in your neck or groin. A needle will be put through your cut and into a large vein. A  tube (catheter) will be put into the needle. The tube will be moved to your heart. Dye may be put through the tube. This helps your doctor see your heart. Small devices (electrodes) on the tube will send out signals. A  type of energy will be used to destroy some heart tissue. The tube will be taken out. Pressure will be held on your cut. This helps stop bleeding. A bandage will be put over your cut. The exact procedure may vary among doctors and hospitals. What happens after the procedure? You will be watched until you leave the hospital or clinic. This includes checking your heart rate, breathing rate, oxygen, and blood pressure. Your cut will be watched for bleeding. You will need to lie still for a few hours. Do not drive for 24 hours or as long as your doctor tells you. Summary Cardiac ablation is a procedure to destroy some heart tissue. This is done to treat heart rhythm problems. Tell your doctor about any medical conditions you may have. Tell him or her about all medicines you are taking to treat them. This is a safe procedure. But problems may occur. These include infection, bruising, bleeding, and damage to nearby areas of your body. Follow what your doctor tells you about food and drink. You may also be told to change or stop some of your medicines. After the procedure, do not drive for 24 hours or as long as your doctor tells you. This information is not intended to replace advice given to you by your health care provider. Make sure you discuss any questions you have with your healthcare provider. Document Revised: 02/09/2019 Document Reviewed: 02/09/2019 Elsevier Patient Education  2022 [employer].

## 2020-09-02 NOTE — H&P (View-Only) (Signed)
PCP: Moshe Cipro, MD Primary Cardiologist: Dr Gardiner Rhyme Primary EP: Dr Rayann Heman  Savannah Hobbs is a 75 y.o. female who presents today for routine electrophysiology followup.  Since last being seen in our clinic, the patient reports doing reasonably well.  Her AF has increased in frequency and duration.  + palpitations and fatigue.  + snoring and tired during the day also.  Today, she denies symptoms of chest pain, shortness of breath,  lower extremity edema, dizziness, presyncope, or syncope.  The patient is otherwise without complaint today.   Past Medical History:  Diagnosis Date   Allergy    Arthritis    Asthma    Cirrhosis of liver ([place])    due to "medications for arthritis".    Elevated cholesterol    Erosive esophagitis 1995   Fatty liver    GERD (gastroesophageal reflux disease)    Hiatal hernia    LARGE   Hx of colonic polyps 05/27/2018   Hx of transfusion of platelets    Paroxysmal atrial fibrillation (HCC)    Thrombocytopenia (HCC)    Typical atrial flutter (HCC)    UTI (lower urinary tract infection)    Past Surgical History:  Procedure Laterality Date   ABDOMINAL HYSTERECTOMY     partial   APPENDECTOMY     COLONOSCOPY     ESOPHAGOGASTRODUODENOSCOPY     GYNECOLOGIC CRYOSURGERY     x2   REPLACEMENT TOTAL KNEE Left 2016   TONSILLECTOMY     TOTAL KNEE ARTHROPLASTY Right    TUBAL LIGATION     UPPER GASTROINTESTINAL ENDOSCOPY      ROS- all systems are reviewed and negatives except as per HPI above  Current Outpatient Medications  Medication Sig Dispense Refill   Ascorbic Acid (VITAMIN C) 1000 MG tablet Take 1,000 mg by mouth daily.     cetirizine (ZYRTEC) 10 MG tablet Take 10 mg by mouth in the morning.     Cholecalciferol (VITAMIN D-3) 25 MCG (1000 UT) CAPS Take 1,000 Units by mouth daily.     diltiazem (CARDIZEM CD) 120 MG 24 hr capsule Take 1 capsule (120 mg total) by mouth daily. 90 capsule 3   docusate sodium (COLACE) 100 MG capsule Take 100 mg by  mouth in the morning.      esomeprazole (NEXIUM) 20 MG capsule Take 20 mg by mouth daily before breakfast.     ferrous sulfate 325 (65 FE) MG tablet Take 325 mg by mouth daily.     flecainide (TAMBOCOR) 100 MG tablet Take 2 tablets (200 mg total) by mouth as directed. Take 2 tablets as needed daily for Afib 10 tablet 1   furosemide (LASIX) 40 MG tablet Take 1 tablet (40 mg total) by mouth daily. 90 tablet 3   ibuprofen (ADVIL) 200 MG tablet Take 200 mg by mouth every 6 (six) hours as needed for mild pain (or headaches).     metoprolol tartrate (LOPRESSOR) 25 MG tablet Take 1 tablet (25 mg total) by mouth Once PRN for up to 1 dose (take 2 hours before your CT scan ONLY IF your heart rate is greater than 75.). 1 tablet 0   vitamin B-12 (CYANOCOBALAMIN) 1000 MCG tablet Take 1,000 mcg by mouth daily.     apixaban (ELIQUIS) 5 MG TABS tablet Take 1 tablet (5 mg total) by mouth 2 (two) times daily. 180 tablet 1   atorvastatin (LIPITOR) 20 MG tablet Take 1 tablet (20 mg total) by mouth daily. 90 tablet 3  potassium chloride SA (KLOR-CON) 20 MEQ tablet Take 1 tablet (20 mEq total) by mouth daily. 90 tablet 3   No current facility-administered medications for this visit.    Physical Exam: Vitals:   09/02/20 1421  BP: (!) 142/70  Pulse: 75  SpO2: 96%  Weight: 204 lb 9.6 oz (92.8 kg)  Height: 5\' 5"  (1.651 m)    GEN- The patient is well appearing, alert and oriented x 3 today.   Head- normocephalic, atraumatic Eyes-  Sclera clear, conjunctiva pink Ears- hearing intact Oropharynx- clear Lungs- Clear to ausculation bilaterally, normal work of breathing Heart- Regular rate and rhythm, no murmurs, rubs or gallops, PMI not laterally displaced GI- soft, NT, ND, + BS Extremities- no clubbing, cyanosis, or edema  Wt Readings from Last 3 Encounters:  09/02/20 204 lb 9.6 oz (92.8 kg)  07/23/20 204 lb 12.8 oz (92.9 kg)  04/16/20 208 lb 3.2 oz (94.4 kg)   Echo 01/23/20- EF 60%, moderate LA  enlargement  EKG tracing ordered today is personally reviewed and shows sinus rhythm  Assessment and Plan:  Paroxysmal atrial fibrillation/ atrial flutter The patient has symptomatic, recurrent  atrial fibrillation.  Chads2vasc score is 4.  she is anticoagulated with eliquis. Therapeutic strategies for afib including medicine and ablation were discussed in detail with the patient today. Risk, benefits, and alternatives to EP study and radiofrequency ablation for afib were also discussed in detail today. These risks include but are not limited to stroke, bleeding, vascular damage, tamponade, perforation, damage to the esophagus, lungs, and other structures, pulmonary vein stenosis, worsening renal function, and death. The patient understands these risk and wishes to proceed.  We will therefore proceed with catheter ablation at the next available time.  Carto, ICE, anesthesia are requested for the procedure.  Will also obtain cardiac CT prior to the procedure to exclude LAA thrombus and further evaluate atrial anatomy.  2. Snoring Sleep study ordered in November but has not yet had it...  I have reinforced importance of sleep evaluation today  3. Obesity Body mass index is 34.05 kg/m. Lifestyle modification advised  Thompson Grayer MD, [facility] 09/02/2020 2:26 PM

## 2020-09-02 NOTE — Progress Notes (Signed)
PCP: Moshe Cipro, MD Primary Cardiologist: Dr Gardiner Rhyme Primary EP: Dr Rayann Heman  Savannah Hobbs is a 75 y.o. female who presents today for routine electrophysiology followup.  Since last being seen in our clinic, the patient reports doing reasonably well.  Her AF has increased in frequency and duration.  + palpitations and fatigue.  + snoring and tired during the day also.  Today, she denies symptoms of chest pain, shortness of breath,  lower extremity edema, dizziness, presyncope, or syncope.  The patient is otherwise without complaint today.   Past Medical History:  Diagnosis Date   Allergy    Arthritis    Asthma    Cirrhosis of liver ([place])    due to "medications for arthritis".    Elevated cholesterol    Erosive esophagitis 1995   Fatty liver    GERD (gastroesophageal reflux disease)    Hiatal hernia    LARGE   Hx of colonic polyps 05/27/2018   Hx of transfusion of platelets    Paroxysmal atrial fibrillation (HCC)    Thrombocytopenia (HCC)    Typical atrial flutter (HCC)    UTI (lower urinary tract infection)    Past Surgical History:  Procedure Laterality Date   ABDOMINAL HYSTERECTOMY     partial   APPENDECTOMY     COLONOSCOPY     ESOPHAGOGASTRODUODENOSCOPY     GYNECOLOGIC CRYOSURGERY     x2   REPLACEMENT TOTAL KNEE Left 2016   TONSILLECTOMY     TOTAL KNEE ARTHROPLASTY Right    TUBAL LIGATION     UPPER GASTROINTESTINAL ENDOSCOPY      ROS- all systems are reviewed and negatives except as per HPI above  Current Outpatient Medications  Medication Sig Dispense Refill   Ascorbic Acid (VITAMIN C) 1000 MG tablet Take 1,000 mg by mouth daily.     cetirizine (ZYRTEC) 10 MG tablet Take 10 mg by mouth in the morning.     Cholecalciferol (VITAMIN D-3) 25 MCG (1000 UT) CAPS Take 1,000 Units by mouth daily.     diltiazem (CARDIZEM CD) 120 MG 24 hr capsule Take 1 capsule (120 mg total) by mouth daily. 90 capsule 3   docusate sodium (COLACE) 100 MG capsule Take 100 mg by  mouth in the morning.      esomeprazole (NEXIUM) 20 MG capsule Take 20 mg by mouth daily before breakfast.     ferrous sulfate 325 (65 FE) MG tablet Take 325 mg by mouth daily.     flecainide (TAMBOCOR) 100 MG tablet Take 2 tablets (200 mg total) by mouth as directed. Take 2 tablets as needed daily for Afib 10 tablet 1   furosemide (LASIX) 40 MG tablet Take 1 tablet (40 mg total) by mouth daily. 90 tablet 3   ibuprofen (ADVIL) 200 MG tablet Take 200 mg by mouth every 6 (six) hours as needed for mild pain (or headaches).     metoprolol tartrate (LOPRESSOR) 25 MG tablet Take 1 tablet (25 mg total) by mouth Once PRN for up to 1 dose (take 2 hours before your CT scan ONLY IF your heart rate is greater than 75.). 1 tablet 0   vitamin B-12 (CYANOCOBALAMIN) 1000 MCG tablet Take 1,000 mcg by mouth daily.     apixaban (ELIQUIS) 5 MG TABS tablet Take 1 tablet (5 mg total) by mouth 2 (two) times daily. 180 tablet 1   atorvastatin (LIPITOR) 20 MG tablet Take 1 tablet (20 mg total) by mouth daily. 90 tablet 3  potassium chloride SA (KLOR-CON) 20 MEQ tablet Take 1 tablet (20 mEq total) by mouth daily. 90 tablet 3   No current facility-administered medications for this visit.    Physical Exam: Vitals:   09/02/20 1421  BP: (!) 142/70  Pulse: 75  SpO2: 96%  Weight: 204 lb 9.6 oz (92.8 kg)  Height: 5\' 5"  (1.651 m)    GEN- The patient is well appearing, alert and oriented x 3 today.   Head- normocephalic, atraumatic Eyes-  Sclera clear, conjunctiva pink Ears- hearing intact Oropharynx- clear Lungs- Clear to ausculation bilaterally, normal work of breathing Heart- Regular rate and rhythm, no murmurs, rubs or gallops, PMI not laterally displaced GI- soft, NT, ND, + BS Extremities- no clubbing, cyanosis, or edema  Wt Readings from Last 3 Encounters:  09/02/20 204 lb 9.6 oz (92.8 kg)  07/23/20 204 lb 12.8 oz (92.9 kg)  04/16/20 208 lb 3.2 oz (94.4 kg)   Echo 01/23/20- EF 60%, moderate LA  enlargement  EKG tracing ordered today is personally reviewed and shows sinus rhythm  Assessment and Plan:  Paroxysmal atrial fibrillation/ atrial flutter The patient has symptomatic, recurrent  atrial fibrillation.  Chads2vasc score is 4.  she is anticoagulated with eliquis. Therapeutic strategies for afib including medicine and ablation were discussed in detail with the patient today. Risk, benefits, and alternatives to EP study and radiofrequency ablation for afib were also discussed in detail today. These risks include but are not limited to stroke, bleeding, vascular damage, tamponade, perforation, damage to the esophagus, lungs, and other structures, pulmonary vein stenosis, worsening renal function, and death. The patient understands these risk and wishes to proceed.  We will therefore proceed with catheter ablation at the next available time.  Carto, ICE, anesthesia are requested for the procedure.  Will also obtain cardiac CT prior to the procedure to exclude LAA thrombus and further evaluate atrial anatomy.  2. Snoring Sleep study ordered in November but has not yet had it...  I have reinforced importance of sleep evaluation today  3. Obesity Body mass index is 34.05 kg/m. Lifestyle modification advised  Thompson Grayer MD, [facility] 09/02/2020 2:26 PM

## 2020-09-03 LAB — CBC WITH DIFFERENTIAL/PLATELET
Basophils Absolute: 0 10*3/uL (ref 0.0–0.2)
Basos: 0 %
EOS (ABSOLUTE): 0.2 10*3/uL (ref 0.0–0.4)
Eos: 1 %
Hematocrit: 33.9 % — ABNORMAL LOW (ref 34.0–46.6)
Hemoglobin: 11.1 g/dL (ref 11.1–15.9)
Lymphocytes Absolute: 0.9 10*3/uL (ref 0.7–3.1)
Lymphs: 4 %
MCH: 27.2 pg (ref 26.6–33.0)
MCHC: 32.7 g/dL (ref 31.5–35.7)
MCV: 83 fL (ref 79–97)
Monocytes Absolute: 0.7 10*3/uL (ref 0.1–0.9)
Monocytes: 3 %
Neutrophils Absolute: 19.1 10*3/uL — ABNORMAL HIGH (ref 1.4–7.0)
Neutrophils: 87 %
Platelets: 118 10*3/uL — ABNORMAL LOW (ref 150–450)
RBC: 4.08 x10E6/uL (ref 3.77–5.28)
RDW: 20.5 % — ABNORMAL HIGH (ref 11.7–15.4)
WBC: 21.9 10*3/uL (ref 3.4–10.8)

## 2020-09-03 LAB — IMMATURE CELLS
MYELOCYTES: 1 % — ABNORMAL HIGH (ref 0–0)
Metamyelocytes: 4 % — ABNORMAL HIGH (ref 0–0)

## 2020-09-03 LAB — BASIC METABOLIC PANEL
BUN/Creatinine Ratio: 19 (ref 12–28)
BUN: 9 mg/dL (ref 8–27)
CO2: 24 mmol/L (ref 20–29)
Calcium: 8.4 mg/dL — ABNORMAL LOW (ref 8.7–10.3)
Chloride: 103 mmol/L (ref 96–106)
Creatinine, Ser: 0.48 mg/dL — ABNORMAL LOW (ref 0.57–1.00)
Glucose: 81 mg/dL (ref 65–99)
Potassium: 4 mmol/L (ref 3.5–5.2)
Sodium: 140 mmol/L (ref 134–144)
eGFR: 101 mL/min/{1.73_m2} (ref >59)

## 2020-09-03 NOTE — Addendum Note (Signed)
Addended by: Rose Phi on: 09/03/2020 03:45 PM   Modules accepted: Orders

## 2020-09-09 ENCOUNTER — Telehealth: Payer: Self-pay | Admitting: *Deleted

## 2020-09-09 NOTE — Telephone Encounter (Signed)
I called the pt as a message was sent to me from Erasmo Score. CMA who states the pt left a message on the billing line about her sleep study. I called and s/w the pt's husband who states she is just wanting to know if she is approved for the sleep study. I told them I have not heard anything yet. Pt's husband said pt even called the insurance company herself and has not heard anything back yet from them I assured the pt's husband that we have a team that checks the insurance company for these sleep studies for approval or denial. I assured the pt's husband that I will send a message back to billing to please check and see if the pt has been authorized for the [place] sleep study. I assured them that once I get an authorization or denial I will let them know. Pt's husband thanked me for the help today.

## 2020-09-10 NOTE — Telephone Encounter (Signed)
I s/w the pt this morning and let her know she is good to go ahead with sleep study. Pt will do [facility] Sleep Study either tonight or tomorrow night. Pt has been given PIN # [id number]. Pt thanked me for the help. Pt aware once sleep study has been read our office will call her with the results.

## 2020-09-11 ENCOUNTER — Encounter (INDEPENDENT_AMBULATORY_CARE_PROVIDER_SITE_OTHER): Payer: Medicare Other | Admitting: Cardiology

## 2020-09-11 DIAGNOSIS — G4734 Idiopathic sleep related nonobstructive alveolar hypoventilation: Secondary | ICD-10-CM | POA: Diagnosis not present

## 2020-09-11 DIAGNOSIS — G4733 Obstructive sleep apnea (adult) (pediatric): Secondary | ICD-10-CM | POA: Diagnosis not present

## 2020-09-13 ENCOUNTER — Ambulatory Visit: Payer: Medicare Other

## 2020-09-13 DIAGNOSIS — I480 Paroxysmal atrial fibrillation: Secondary | ICD-10-CM

## 2020-09-13 DIAGNOSIS — D6869 Other thrombophilia: Secondary | ICD-10-CM

## 2020-09-13 DIAGNOSIS — I4891 Unspecified atrial fibrillation: Secondary | ICD-10-CM

## 2020-09-13 NOTE — Procedures (Signed)
Sleep Study Report  Patient Information Study Date: Sep 11, 2020 Patient Name: Savannah Hobbs Patient ID: [id number] Birth Date: [birth year] Age: 75 Gender: Female BMI: 47.4 (W=205 lb, H=4' 7'') Neck Circ.: 53 '' Referring Physician: Thompson Grayer, MD  TEST DESCRIPTION: Home sleep apnea testing was completed using the WatchPat, a Type 1 device, utilizing  peripheral arterial tonometry (PAT), chest movement, actigraphy, pulse oximetry, pulse rate, body position and snore.  AHI was calculated with apnea and hypopnea using valid sleep time as the denominator. RDI includes apneas,  hypopneas, and RERAs. The data acquired and the scoring of sleep and all associated events were performed in  accordance with the recommended standards and specifications as outlined in the AASM Manual for the Scoring of  Sleep and Associated Events 2.2.0 (2015).   FINDINGS:  1. Mild Obstructive Sleep Apnea with AHI 5/hr.  2. No Central Sleep Apnea with pAHIc 0.5/hr.  3. Oxygen desaturations as low as 79%.  4. Moderate snoring was present. O2 sats were < 88% for 37.1 min.  5. Total sleep time was 8 hrs and 17 min.  6. 20.9% of total sleep time was spent in REM sleep.  7. Normal sleep onset latency at 20 min.  8. Normal REM sleep onset latency at 81 min.  9. Total awakenings were 10.   DIAGNOSIS:  Mild Obstructive Sleep Apnea (G47.33) Nocturnal Hypoxemia  RECOMMENDATIONS: 1. Clinical correlation of these findings is necessary. The decision to treat obstructive sleep apnea (OSA) is usually  based on the presence of apnea symptoms or the presence of associated medical conditions such as Hypertension,  Congestive Heart Failure, Atrial Fibrillation or Obesity. The most common symptoms of OSA are snoring, gasping for  breath while sleeping, daytime sleepiness and fatigue.   2. Initiating apnea therapy is recommended given the presence of symptoms and/or associated conditions.  Recommend proceeding with  one of the following:   a. Auto-CPAP therapy with a pressure range of 5-20cm H2O.   b. An oral appliance (OA) that can be obtained from certain dentists with expertise in sleep medicine. These are  primarily of use in non-obese patients with mild and moderate disease.   c. An ENT consultation which may be useful to look for specific causes of obstruction and possible treatment  options.   d. If patient is intolerant to PAP therapy, consider referral to ENT for evaluation for hypoglossal nerve stimulator.   3. Close follow-up is necessary to ensure success with CPAP or oral appliance therapy for maximum benefit .  4. A follow-up oximetry study on CPAP is recommended to assess the adequacy of therapy and determine the need  for supplemental oxygen or the potential need for Bi-level therapy. An arterial blood gas to determine the adequacy of  baseline ventilation and oxygenation should also be considered.  5. Healthy sleep recommendations include: adequate nightly sleep (normal 7-9 hrs/night), avoidance of caffeine after  noon and alcohol near bedtime, and maintaining a sleep environment that is cool, dark and quiet.  6. Weight loss for overweight patients is recommended. Even modest amounts of weight loss can significantly  improve the severity of sleep apnea.  7. Snoring recommendations include: weight loss where appropriate, side sleeping, and avoidance of alcohol before  bed.  8. Operation of motor vehicle or dangerous equipment must be avoided when feeling drowsy, excessively sleepy, or  mentally fatigued.  Signature: Electronically Signed: Sep 13, 2020 Fransico Him, MD; [facility]; [place], [place] Board of  Sleep Medicine Report  prepared by: Fransico Him, MD

## 2020-09-14 ENCOUNTER — Telehealth: Payer: Self-pay | Admitting: *Deleted

## 2020-09-14 DIAGNOSIS — G4733 Obstructive sleep apnea (adult) (pediatric): Secondary | ICD-10-CM

## 2020-09-14 NOTE — Telephone Encounter (Signed)
-----   Message from Sueanne Margarita, MD sent at 09/13/2020  5:23 PM EDT ----- Please let patient know that they have sleep apnea.  Recommend therapeutic CPAP titration for treatment of patient's sleep disordered breathing.  If unable to perform an in lab titration then initiate ResMed auto CPAP from 4 to 15cm H2O with heated humidity and mask of choice and overnight pulse ox on CPAP.

## 2020-09-14 NOTE — Telephone Encounter (Signed)
The patient has been notified of the result and verbalized understanding.  All questions (if any) were answered. Marolyn Hammock, [place] 09/14/2020 3:28 PM

## 2020-09-16 ENCOUNTER — Telehealth (HOSPITAL_COMMUNITY): Payer: Self-pay | Admitting: Emergency Medicine

## 2020-09-16 NOTE — Telephone Encounter (Signed)
.  Ok to schedule patient has medicare.will schedule titration.

## 2020-09-16 NOTE — Addendum Note (Signed)
Addended by: Freada Bergeron on: 09/16/2020 02:21 PM   Modules accepted: Orders

## 2020-09-16 NOTE — Telephone Encounter (Signed)
Reaching out to patient to offer assistance regarding upcoming cardiac imaging study; pt verbalizes understanding of appt date/time, parking situation and where to check in, pre-test NPO status and medications ordered, and verified current allergies; name and call back number provided for further questions should they arise Marchia Bond RN [place] [facility] [phone] office [phone] cell   Pt denies contrast allergy. States shes allergic to shrimp but has received contrast without issues in the past. Clarise Cruz

## 2020-09-17 ENCOUNTER — Telehealth (HOSPITAL_COMMUNITY): Payer: Self-pay | Admitting: Emergency Medicine

## 2020-09-17 NOTE — Telephone Encounter (Signed)
Opened in error

## 2020-09-18 ENCOUNTER — Telehealth: Payer: Self-pay

## 2020-09-18 NOTE — Telephone Encounter (Signed)
Called patient and LM letting her know her sleep study is to be completed at [facility] on July 6 and to call the sleep center directly if she has any questions. [phone]

## 2020-09-19 ENCOUNTER — Other Ambulatory Visit: Payer: Self-pay

## 2020-09-19 ENCOUNTER — Ambulatory Visit (HOSPITAL_COMMUNITY)
Admission: RE | Admit: 2020-09-19 | Discharge: 2020-09-19 | Disposition: A | Discharge status: Home / Self Care | Payer: Medicare Other | Source: Ambulatory Visit | Attending: Internal Medicine | Admitting: Internal Medicine

## 2020-09-19 DIAGNOSIS — I4891 Unspecified atrial fibrillation: Secondary | ICD-10-CM

## 2020-09-19 MED ORDER — IOHEXOL 350 MG/ML SOLN
80.0000 mL | Freq: Once | INTRAVENOUS | Status: AC | PRN
  Administered 2020-09-19: 80 mL via INTRAVENOUS

## 2020-09-25 ENCOUNTER — Telehealth: Payer: Self-pay | Admitting: Internal Medicine

## 2020-09-25 NOTE — Telephone Encounter (Signed)
Pt is needing to speak w/ Dr. Bonita Quin nurse Otila Kluver, pt is scheduled for a cardiac ablation and has a few questions... please advise.

## 2020-09-25 NOTE — Telephone Encounter (Signed)
Called patient and answered all questions.

## 2020-09-25 NOTE — Pre-Procedure Instructions (Signed)
Instructed patient on the following items: Arrival time 0830 Nothing to eat or drink after midnight No meds AM of procedure Responsible person to drive you home and stay with you for 24 hrs  Have you missed any doses of anti-coagulant Eliquis- hasn't missed any doses   

## 2020-09-26 ENCOUNTER — Ambulatory Visit (HOSPITAL_COMMUNITY): Payer: Medicare Other | Admitting: Anesthesiology

## 2020-09-26 ENCOUNTER — Other Ambulatory Visit: Payer: Self-pay

## 2020-09-26 ENCOUNTER — Encounter (HOSPITAL_COMMUNITY)
Admission: RE | Disposition: A | Discharge status: Home / Self Care | Payer: Self-pay | Source: Home / Self Care | Attending: Internal Medicine

## 2020-09-26 ENCOUNTER — Ambulatory Visit (HOSPITAL_COMMUNITY)
Admission: RE | Admit: 2020-09-26 | Discharge: 2020-09-26 | Disposition: A | Discharge status: Home / Self Care | Payer: Medicare Other | Attending: Internal Medicine | Admitting: Internal Medicine

## 2020-09-26 DIAGNOSIS — Z7901 Long term (current) use of anticoagulants: Secondary | ICD-10-CM | POA: Insufficient documentation

## 2020-09-26 DIAGNOSIS — R0683 Snoring: Secondary | ICD-10-CM | POA: Insufficient documentation

## 2020-09-26 DIAGNOSIS — E669 Obesity, unspecified: Secondary | ICD-10-CM | POA: Insufficient documentation

## 2020-09-26 DIAGNOSIS — I480 Paroxysmal atrial fibrillation: Secondary | ICD-10-CM | POA: Diagnosis present

## 2020-09-26 DIAGNOSIS — I483 Typical atrial flutter: Secondary | ICD-10-CM | POA: Diagnosis not present

## 2020-09-26 DIAGNOSIS — Z6834 Body mass index (BMI) 34.0-34.9, adult: Secondary | ICD-10-CM | POA: Diagnosis not present

## 2020-09-26 DIAGNOSIS — Z79899 Other long term (current) drug therapy: Secondary | ICD-10-CM | POA: Insufficient documentation

## 2020-09-26 HISTORY — PX: ATRIAL FIBRILLATION ABLATION: EP1191

## 2020-09-26 LAB — POCT ACTIVATED CLOTTING TIME: Activated Clotting Time: 347 seconds

## 2020-09-26 SURGERY — ATRIAL FIBRILLATION ABLATION
Anesthesia: General

## 2020-09-26 MED ORDER — FENTANYL CITRATE (PF) 250 MCG/5ML IJ SOLN
INTRAMUSCULAR | Status: DC | PRN
  Administered 2020-09-26: 100 ug via INTRAVENOUS

## 2020-09-26 MED ORDER — SODIUM CHLORIDE 0.9% FLUSH: 3.0000 mL | Freq: Two times a day (BID) | INTRAVENOUS | Status: DC

## 2020-09-26 MED ORDER — ONDANSETRON HCL 4 MG/2ML IJ SOLN
INTRAMUSCULAR | Status: DC | PRN
  Administered 2020-09-26: 4 mg via INTRAVENOUS

## 2020-09-26 MED ORDER — SODIUM CHLORIDE 0.9% FLUSH: 3.0000 mL | INTRAVENOUS | Status: DC | PRN

## 2020-09-26 MED ORDER — ACETAMINOPHEN 325 MG PO TABS
650.0000 mg | ORAL_TABLET | ORAL | Status: DC | PRN
  Filled 2020-09-26: qty 2

## 2020-09-26 MED ORDER — HEPARIN SODIUM (PORCINE) 1000 UNIT/ML IJ SOLN
INTRAMUSCULAR | Status: AC
  Filled 2020-09-26: qty 2

## 2020-09-26 MED ORDER — LIDOCAINE 2% (20 MG/ML) 5 ML SYRINGE
INTRAMUSCULAR | Status: DC | PRN
  Administered 2020-09-26: 40 mg via INTRAVENOUS

## 2020-09-26 MED ORDER — APIXABAN 5 MG PO TABS
5.0000 mg | ORAL_TABLET | Freq: Once | ORAL | Status: AC
  Administered 2020-09-26: 5 mg via ORAL
  Filled 2020-09-26: qty 1

## 2020-09-26 MED ORDER — HEPARIN SODIUM (PORCINE) 1000 UNIT/ML IJ SOLN
INTRAMUSCULAR | Status: DC | PRN
  Administered 2020-09-26: 15000 [IU] via INTRAVENOUS
  Administered 2020-09-26: 1000 [IU] via INTRAVENOUS

## 2020-09-26 MED ORDER — SODIUM CHLORIDE 0.9 % IV SOLN: 250.0000 mL | INTRAVENOUS | Status: DC | PRN

## 2020-09-26 MED ORDER — PHENYLEPHRINE HCL-NACL 10-0.9 MG/250ML-% IV SOLN
INTRAVENOUS | Status: DC | PRN
  Administered 2020-09-26: 15 ug/min via INTRAVENOUS

## 2020-09-26 MED ORDER — HEPARIN (PORCINE) IN NACL 1000-0.9 UT/500ML-% IV SOLN
INTRAVENOUS | Status: DC | PRN
  Administered 2020-09-26: 500 mL

## 2020-09-26 MED ORDER — PROTAMINE SULFATE 10 MG/ML IV SOLN
INTRAVENOUS | Status: DC | PRN
  Administered 2020-09-26: 40 mg via INTRAVENOUS

## 2020-09-26 MED ORDER — HEPARIN (PORCINE) IN NACL 1000-0.9 UT/500ML-% IV SOLN
INTRAVENOUS | Status: AC
  Filled 2020-09-26: qty 500

## 2020-09-26 MED ORDER — SUGAMMADEX SODIUM 200 MG/2ML IV SOLN
INTRAVENOUS | Status: DC | PRN
  Administered 2020-09-26: 200 mg via INTRAVENOUS

## 2020-09-26 MED ORDER — DEXAMETHASONE SODIUM PHOSPHATE 10 MG/ML IJ SOLN
INTRAMUSCULAR | Status: DC | PRN
  Administered 2020-09-26: 10 mg via INTRAVENOUS

## 2020-09-26 MED ORDER — ONDANSETRON HCL 4 MG/2ML IJ SOLN: 4.0000 mg | Freq: Four times a day (QID) | INTRAMUSCULAR | Status: DC | PRN

## 2020-09-26 MED ORDER — SODIUM CHLORIDE 0.9 % IV SOLN: INTRAVENOUS | Status: DC

## 2020-09-26 MED ORDER — HEPARIN SODIUM (PORCINE) 1000 UNIT/ML IJ SOLN
INTRAMUSCULAR | Status: DC | PRN
  Administered 2020-09-26: 1000 [IU] via INTRAVENOUS

## 2020-09-26 MED ORDER — ROCURONIUM BROMIDE 10 MG/ML (PF) SYRINGE
PREFILLED_SYRINGE | INTRAVENOUS | Status: DC | PRN
  Administered 2020-09-26: 50 mg via INTRAVENOUS
  Administered 2020-09-26: 30 mg via INTRAVENOUS

## 2020-09-26 MED ORDER — ISOPROTERENOL HCL 0.2 MG/ML IJ SOLN
INTRAVENOUS | Status: DC | PRN
  Administered 2020-09-26: 20 ug/min via INTRAVENOUS

## 2020-09-26 MED ORDER — ISOPROTERENOL HCL 0.2 MG/ML IJ SOLN
INTRAMUSCULAR | Status: AC
  Filled 2020-09-26: qty 5

## 2020-09-26 MED ORDER — HYDROCODONE-ACETAMINOPHEN 5-325 MG PO TABS: 1.0000 | ORAL_TABLET | ORAL | Status: DC | PRN

## 2020-09-26 MED ORDER — PROPOFOL 10 MG/ML IV BOLUS
INTRAVENOUS | Status: DC | PRN
  Administered 2020-09-26: 30 mg via INTRAVENOUS
  Administered 2020-09-26: 120 mg via INTRAVENOUS

## 2020-09-26 SURGICAL SUPPLY — 19 items
BLANKET WARM UNDERBOD FULL ACC (MISCELLANEOUS) ×2 IMPLANT
CATH 8FR REPROCESSED SOUNDSTAR (CATHETERS) ×2 IMPLANT
CATH MAPPNG PENTARAY F 2-6-2MM (CATHETERS) ×1 IMPLANT
CATH SMTCH THERMOCOOL SF DF (CATHETERS) ×2 IMPLANT
CATH WEB BI DIR CSDF CRV REPRO (CATHETERS) ×2 IMPLANT
CLOSURE PERCLOSE PROSTYLE (VASCULAR PRODUCTS) ×6 IMPLANT
COVER SWIFTLINK CONNECTOR (BAG) ×2 IMPLANT
MAT PREVALON FULL STRYKER (MISCELLANEOUS) ×2 IMPLANT
NEEDLE BAYLIS TRANSSEPTAL 71CM (NEEDLE) ×2 IMPLANT
PACK EP LATEX FREE (CUSTOM PROCEDURE TRAY) ×1
PACK EP LF (CUSTOM PROCEDURE TRAY) ×1 IMPLANT
PAD PRO RADIOLUCENT 2001M-C (PAD) ×2 IMPLANT
PATCH CARTO3 (PAD) ×2 IMPLANT
PENTARAY F 2-6-2MM (CATHETERS) ×2
SHEATH PINNACLE 7F 10CM (SHEATH) ×4 IMPLANT
SHEATH PINNACLE 9F 10CM (SHEATH) ×2 IMPLANT
SHEATH PROBE COVER 6X72 (BAG) ×2 IMPLANT
SHEATH SWARTZ TS SL2 63CM 8.5F (SHEATH) ×2 IMPLANT
TUBING SMART ABLATE COOLFLOW (TUBING) ×2 IMPLANT

## 2020-09-26 NOTE — Anesthesia Preprocedure Evaluation (Addendum)
Anesthesia Evaluation  Patient identified by MRN, date of birth, ID band Patient awake    Reviewed: Allergy & Precautions, NPO status , Patient's Chart, lab work & pertinent test results, reviewed documented beta blocker date and time   History of Anesthesia Complications Negative for: history of anesthetic complications  Airway Mallampati: II  TM Distance: >3 FB Neck ROM: Full    Dental  (+) Dental Advisory Given, Teeth Intact   Pulmonary asthma ,    Pulmonary exam normal        Cardiovascular Normal cardiovascular exam+ dysrhythmias Atrial Fibrillation    '21 Myoperfusion - The left ventricular ejection fraction is hyperdynamic (>65%). Nuclear stress EF: 68%. There was no ST segment deviation noted during stress. The study is normal. This is a low risk study.  '21 TTE - EF 60 to 65%. Left atrial size was moderately dilated. The trivial pericardial effusion is posterior to the left ventricle. Mild mitral valve regurgitation. Mild aortic  valve stenosis. Aortic valve area, by VTI measures 1.59 cm. Aortic valve mean gradient measures 11.0 mmHg.  Aortic valve Vmax measures 2.15 m/s.     Neuro/Psych negative neurological ROS  negative psych ROS   GI/Hepatic hiatal hernia, PUD, GERD  Medicated and Controlled,(+) Cirrhosis       ,   Endo/Other   Obesity   Renal/GU negative Renal ROS     Musculoskeletal  (+) Arthritis ,   Abdominal   Peds  Hematology  On eliquis    Anesthesia Other Findings   Reproductive/Obstetrics                            Anesthesia Physical Anesthesia Plan  ASA: 3  Anesthesia Plan: General   Post-op Pain Management:    Induction: Intravenous  PONV Risk Score and Plan: 3 and Treatment may vary due to age or medical condition, Ondansetron and Dexamethasone  Airway Management Planned: Oral ETT  Additional Equipment: None  Intra-op Plan:    Post-operative Plan: Extubation in OR  Informed Consent: I have reviewed the patients History and Physical, chart, labs and discussed the procedure including the risks, benefits and alternatives for the proposed anesthesia with the patient or authorized representative who has indicated his/her understanding and acceptance.     Dental advisory given  Plan Discussed with: CRNA and Anesthesiologist  Anesthesia Plan Comments:        Anesthesia Quick Evaluation

## 2020-09-26 NOTE — Interval H&P Note (Signed)
History and Physical Interval Note:  09/26/2020 10:02 AM  Savannah Hobbs  has presented today for surgery, with the diagnosis of atrial fibrillation.  The various methods of treatment have been discussed with the patient and family. After consideration of risks, benefits and other options for treatment, the patient has consented to  Procedure(s): ATRIAL FIBRILLATION ABLATION (N/A) as a surgical intervention.  The patient's history has been reviewed, patient examined, no change in status, stable for surgery.  I have reviewed the patient's chart and labs.  Questions were answered to the patient's satisfaction.    Risk, benefits, and alternatives to EP study and radiofrequency ablation for afib were again discussed in detail today. These risks include but are not limited to stroke, bleeding, vascular damage, tamponade, perforation, damage to the esophagus, lungs, and other structures, pulmonary vein stenosis, worsening renal function, and death. The patient understands these risk and wishes to proceed.    Cardiac CT reviewed at length with the patient today.  she reports compliance with Martha without interruption.  Thompson Grayer MD, [place] 09/26/2020 10:02 AM    Thompson Grayer

## 2020-09-26 NOTE — Progress Notes (Signed)
Pt ambulated without difficulty or bleeding.   Discharged home with her husband who will drive and stay with pt x 24 hrs. 

## 2020-09-26 NOTE — Discharge Instructions (Addendum)
Post procedure care instructions No driving for 4 days. No lifting over 5 lbs for 1 week. No vigorous or sexual activity for 1 week. You may return to work/your usual activities on 10/04/20. Keep procedure site clean & dry. If you notice increased pain, swelling, bleeding or pus, call/return!  You may shower after 24 hours, but no soaking in baths/hot tubs/pools for 1 week.   You have an appointment set up with the [facility].  Multiple studies have shown that being followed by a dedicated atrial fibrillation clinic in addition to the standard care you receive from your other physicians improves health. We believe that enrollment in the atrial fibrillation clinic will allow [place] to better care for you.   The phone number to the [facility] is [phone]. The clinic is staffed Monday through Friday from 8:30am to 5pm.  Parking Directions: The clinic is located in the Heart and Vascular Building connected to [facility]. 1)From [address] turn on to [address] and go to the 3rd entrance  (Heart and Vascular entrance) on the right. 2)Look to the right for Heart &Vascular Parking Garage. 3)A code for the entrance is required, for August is 5544.   4)Take the elevators to the 1st floor. Registration is in the room with the glass walls at the end of the hallway.  If you have any trouble parking or locating the clinic, please don't hesitate to call [phone].    Cardiac Ablation, Care After  This sheet gives you information about how to care for yourself after your procedure. Your health care provider may also give you more specific instructions. If you have problems or questions, contact your health care provider. What can I expect after the procedure? After the procedure, it is common to have: Bruising around your puncture site. Tenderness around your puncture site. Skipped heartbeats. Tiredness (fatigue).  Follow these instructions at  home: Puncture site care  Follow instructions from your health care provider about how to take care of your puncture site. Make sure you: If present, leave stitches (sutures), skin glue, or adhesive strips in place. These skin closures may need to stay in place for up to 2 weeks. If adhesive strip edges start to loosen and curl up, you may trim the loose edges. Do not remove adhesive strips completely unless your health care provider tells you to do that. If a large square bandage is present, this may be removed 24 hours after surgery.  Check your puncture site every day for signs of infection. Check for: Redness, swelling, or pain. Fluid or blood. If your puncture site starts to bleed, lie down on your back, apply firm pressure to the area, and contact your health care provider. Warmth. Pus or a bad smell. Driving Do not drive for at least 4 days after your procedure or however long your health care provider recommends. (Do not resume driving if you have previously been instructed not to drive for other health reasons.) Do not drive or use heavy machinery while taking prescription pain medicine. Activity Avoid activities that take a lot of effort for at least 7 days after your procedure. Do not lift anything that is heavier than 5 lb (4.5 kg) for one week.  No sexual activity for 1 week.  Return to your normal activities as told by your health care provider. Ask your health care provider what activities are safe for you. General instructions Take over-the-counter and prescription medicines only as told by your health care  provider. Do not use any products that contain nicotine or tobacco, such as cigarettes and e-cigarettes. If you need help quitting, ask your health care provider. You may shower after 24 hours, but Do not take baths, swim, or use a hot tub for 1 week.  Do not drink alcohol for 24 hours after your procedure. Keep all follow-up visits as told by your health care provider. This  is important. Contact a health care provider if: You have redness, mild swelling, or pain around your puncture site. You have fluid or blood coming from your puncture site that stops after applying firm pressure to the area. Your puncture site feels warm to the touch. You have pus or a bad smell coming from your puncture site. You have a fever. You have chest pain or discomfort that spreads to your neck, jaw, or arm. You are sweating a lot. You feel nauseous. You have a fast or irregular heartbeat. You have shortness of breath. You are dizzy or light-headed and feel the need to lie down. You have pain or numbness in the arm or leg closest to your puncture site. Get help right away if: Your puncture site suddenly swells. Your puncture site is bleeding and the bleeding does not stop after applying firm pressure to the area. These symptoms may represent a serious problem that is an emergency. Do not wait to see if the symptoms will go away. Get medical help right away. Call your local emergency services (911 in the [place] Do not drive yourself to the hospital. Summary After the procedure, it is normal to have bruising and tenderness at the puncture site in your groin, neck, or forearm. Check your puncture site every day for signs of infection. Get help right away if your puncture site is bleeding and the bleeding does not stop after applying firm pressure to the area. This is a medical emergency. This information is not intended to replace advice given to you by your health care provider. Make sure you discuss any questions you have with your health care provider.

## 2020-09-26 NOTE — Transfer of Care (Signed)
Immediate Anesthesia Transfer of Care Note  Patient: Savannah Hobbs  Procedure(s) Performed: ATRIAL FIBRILLATION ABLATION  Patient Location: Cath Lab  Anesthesia Type:General  Level of Consciousness: awake, alert  and oriented  Airway & Oxygen Therapy: Patient Spontanous Breathing and Patient connected to nasal cannula oxygen  Post-op Assessment: Report given to RN, Post -op Vital signs reviewed and stable and Patient moving all extremities  Post vital signs: Reviewed and stable  Last Vitals:  Vitals Value Taken Time  BP 144/59 09/26/20 1245  Temp 36.7 C 09/26/20 1243  Pulse 94 09/26/20 1246  Resp 12 09/26/20 1246  SpO2 97 % 09/26/20 1246  Vitals shown include unvalidated device data.  Last Pain:  Vitals:   09/26/20 1243  TempSrc: Temporal  PainSc: Asleep      Patients Stated Pain Goal: 3 (60/15/61 5379)  Complications: No notable events documented.

## 2020-09-26 NOTE — Anesthesia Procedure Notes (Signed)
Procedure Name: Intubation Date/Time: 09/26/2020 10:30 AM Performed by: Kyung Rudd, CRNA Pre-anesthesia Checklist: Patient identified, Emergency Drugs available, Suction available and Patient being monitored Patient Re-evaluated:Patient Re-evaluated prior to induction Oxygen Delivery Method: Circle System Utilized Preoxygenation: Pre-oxygenation with 100% oxygen Induction Type: IV induction Ventilation: Mask ventilation without difficulty Laryngoscope Size: Mac and 3 Grade View: Grade I Tube type: Oral Tube size: 7.0 mm Number of attempts: 1 Airway Equipment and Method: Stylet and Oral airway Placement Confirmation: ETT inserted through vocal cords under direct vision, positive ETCO2 and breath sounds checked- equal and bilateral Secured at: 21 cm Tube secured with: Tape Dental Injury: Teeth and Oropharynx as per pre-operative assessment

## 2020-09-27 ENCOUNTER — Telehealth: Payer: Self-pay | Admitting: Cardiology

## 2020-09-27 ENCOUNTER — Encounter (HOSPITAL_COMMUNITY): Payer: Self-pay | Admitting: Internal Medicine

## 2020-09-27 NOTE — Anesthesia Postprocedure Evaluation (Signed)
Anesthesia Post Note  Patient: Savannah Hobbs  Procedure(s) Performed: ATRIAL FIBRILLATION ABLATION     Patient location during evaluation: PACU Anesthesia Type: General Level of consciousness: awake and alert Pain management: pain level controlled Vital Signs Assessment: post-procedure vital signs reviewed and stable Respiratory status: spontaneous breathing, nonlabored ventilation, respiratory function stable and patient connected to nasal cannula oxygen Cardiovascular status: blood pressure returned to baseline and stable Postop Assessment: no apparent nausea or vomiting Anesthetic complications: no   No notable events documented.  Last Vitals:  Vitals:   09/26/20 1409 09/26/20 1600  BP: (!) 151/61 (!) 154/57  Pulse: 91 99  Resp: (!) 21 16  Temp:    SpO2: 98% 97%    Last Pain:  Vitals:   09/26/20 1324  TempSrc:   PainSc: 0-No pain                 Marshe Shrestha L Nashalie Sallis

## 2020-09-27 NOTE — Telephone Encounter (Signed)
Patient wanted to talk with Dr. Gardiner Rhyme or nurse.

## 2020-09-27 NOTE — Telephone Encounter (Signed)
Returned call to patient-patient states she had afib ablation yesterday and she has had some clear/pink drainage from her access site.  She describes this a moderate-heavy.  She states she was told she could take the bandage off 24 hours after, she removed bandage and is planning to shower as instructed.   She states she was told to place gauze back on site after and place pressure if needed.   No strenuous activity today.   No active bleeding at this time.     Advised would route to Dr. Rayann Heman nurse to review/discuss further.

## 2020-10-01 NOTE — Telephone Encounter (Signed)
Spoke to the patient and the drainage has stopped and she is feeling well just tired. She will continue to take it easy for the 7 days post ablation. Made sure patient had Afib Clinic number for post ablation issues.

## 2020-10-09 ENCOUNTER — Other Ambulatory Visit: Payer: Self-pay

## 2020-10-09 ENCOUNTER — Ambulatory Visit: Payer: Medicare Other | Attending: Cardiology | Admitting: Cardiology

## 2020-10-09 DIAGNOSIS — G4736 Sleep related hypoventilation in conditions classified elsewhere: Secondary | ICD-10-CM | POA: Insufficient documentation

## 2020-10-09 DIAGNOSIS — G4733 Obstructive sleep apnea (adult) (pediatric): Secondary | ICD-10-CM | POA: Insufficient documentation

## 2020-10-09 DIAGNOSIS — G4734 Idiopathic sleep related nonobstructive alveolar hypoventilation: Secondary | ICD-10-CM

## 2020-10-10 NOTE — Addendum Note (Signed)
Addended by: Freada Bergeron on: 10/10/2020 05:45 PM   Modules accepted: Orders

## 2020-10-10 NOTE — Procedures (Signed)
   Patient Name: Savannah Hobbs, Savannah Hobbs Date: 10/09/2020 Gender: Female D.O.B: 01/05/1946 Age (years): 70 Referring Provider: Fransico Him MD, ABSM Height (inches): 65 Interpreting Physician: Fransico Him MD, ABSM Weight (lbs): 204 RPSGT: Peak, Robert BMI: 34 MRN: [id number] Neck Size: 15.00  CLINICAL INFORMATION The patient is referred for a CPAP titration to treat sleep apnea.  SLEEP STUDY TECHNIQUE As per the AASM Manual for the Scoring of Sleep and Associated Events v2.3 (April 2016) with a hypopnea requiring 4% desaturations.  The channels recorded and monitored were frontal, central and occipital EEG, electrooculogram (EOG), submentalis EMG (chin), nasal and oral airflow, thoracic and abdominal wall motion, anterior tibialis EMG, snore microphone, electrocardiogram, and pulse oximetry. Continuous positive airway pressure (CPAP) was initiated at the beginning of the study and titrated to treat sleep-disordered breathing.  MEDICATIONS Medications self-administered by patient taken the night of the study : N/A  TECHNICIAN COMMENTS Comments added by technician: Patietn demonstrated oral breathing while using nasal mask and was switched to full face mask. Comments added by scorer: N/A  RESPIRATORY PARAMETERS Optimal PAP Pressure (cm):  AHI at Optimal Pressure (/hr):N/A Overall Minimal O2 (%):72.00  Supine % at Optimal Pressure (%): N/A Minimal O2 at Optimal Pressure (%): 69.00   SLEEP ARCHITECTURE The study was initiated at 9:46:13 PM and ended at 5:09:44 AM.  Sleep onset time was 45.7 minutes and the sleep efficiency was 76.8%. The total sleep time was 340.5 minutes.  The patient spent 2.94% of the night in stage N1 sleep, 78.71% in stage N2 sleep, 11.75% in stage N3 and 6.6% in REM.Stage REM latency was 257.0 minutes  Wake after sleep onset was 57.3. Alpha intrusion was absent. Supine sleep was 20.86%.  CARDIAC DATA The 2 lead EKG demonstrated sinus rhythm. The mean  heart rate was 93.63 beats per minute. Other EKG findings include: PVCs.  LEG MOVEMENT DATA The total Periodic Limb Movements of Sleep (PLMS) were 98. The PLMS index was 17.27. A PLMS index of <15 is considered normal in adults.  IMPRESSIONS - An optimal PAP pressure could not be selected due to ongoing respiratory events.  - Central sleep apnea was not noted during this titration (CAI = 0.9/h). - Severe oxygen desaturations were observed during this titration (min O2 = 72.00%). - No snoring was audible during this study. - 2-lead EKG demonstrated: PVCs - Mild periodic limb movements were observed during this study. Arousals associated with PLMs were rare.  DIAGNOSIS - Obstructive Sleep Apnea (G47.33) - Nocturnal Hypoxemia (G47.36) - Unsucessful CPAP titration  RECOMMENDATIONS - Recommend in lab BiPAP titration.  - Avoid alcohol, sedatives and other CNS depressants that may worsen sleep apnea and disrupt normal sleep architecture. - Sleep hygiene should be reviewed to assess factors that may improve sleep quality. - Weight management and regular exercise should be initiated or continued.  [Electronically signed] 10/10/2020 12:42 PM  Fransico Him MD, ABSM Diplomate, American Board of Sleep Medicine

## 2020-10-16 NOTE — Telephone Encounter (Signed)
Patient is scheduled for BIPAP Titration on 10/23/20. Patient understands her titration study will be done at [facility] lab. Patient understands she will receive a letter in a week or so detailing appointment, date, time, and location. Patient understands to call if she does not receive the letter  in a timely manner. Patient agrees with treatment and thanked me for call.   PATIENT CANCELLED 10/14/20. NO RESCHEDULE DATE

## 2020-10-23 ENCOUNTER — Ambulatory Visit (HOSPITAL_COMMUNITY)
Admission: RE | Admit: 2020-10-23 | Discharge: 2020-10-23 | Disposition: A | Discharge status: Home / Self Care | Payer: Medicare Other | Source: Ambulatory Visit | Attending: Physician Assistant | Admitting: Physician Assistant

## 2020-10-23 ENCOUNTER — Encounter: Payer: Medicare Other | Admitting: Cardiology

## 2020-10-23 ENCOUNTER — Other Ambulatory Visit: Payer: Self-pay

## 2020-10-23 ENCOUNTER — Telehealth (HOSPITAL_COMMUNITY): Payer: Self-pay | Admitting: *Deleted

## 2020-10-23 VITALS — BP 146/68 | HR 98 | Ht 65.0 in | Wt 196.2 lb

## 2020-10-23 DIAGNOSIS — D6869 Other thrombophilia: Secondary | ICD-10-CM | POA: Diagnosis not present

## 2020-10-23 DIAGNOSIS — I480 Paroxysmal atrial fibrillation: Secondary | ICD-10-CM | POA: Diagnosis not present

## 2020-10-23 DIAGNOSIS — G4733 Obstructive sleep apnea (adult) (pediatric): Secondary | ICD-10-CM | POA: Diagnosis not present

## 2020-10-23 DIAGNOSIS — Z8249 Family history of ischemic heart disease and other diseases of the circulatory system: Secondary | ICD-10-CM | POA: Diagnosis not present

## 2020-10-23 DIAGNOSIS — Z6832 Body mass index (BMI) 32.0-32.9, adult: Secondary | ICD-10-CM | POA: Insufficient documentation

## 2020-10-23 DIAGNOSIS — E669 Obesity, unspecified: Secondary | ICD-10-CM | POA: Diagnosis not present

## 2020-10-23 DIAGNOSIS — Z79899 Other long term (current) drug therapy: Secondary | ICD-10-CM | POA: Diagnosis not present

## 2020-10-23 DIAGNOSIS — Z7901 Long term (current) use of anticoagulants: Secondary | ICD-10-CM | POA: Diagnosis not present

## 2020-10-23 DIAGNOSIS — K7460 Unspecified cirrhosis of liver: Secondary | ICD-10-CM | POA: Diagnosis not present

## 2020-10-23 DIAGNOSIS — Z888 Allergy status to other drugs, medicaments and biological substances status: Secondary | ICD-10-CM | POA: Diagnosis not present

## 2020-10-23 DIAGNOSIS — E785 Hyperlipidemia, unspecified: Secondary | ICD-10-CM | POA: Insufficient documentation

## 2020-10-23 NOTE — Progress Notes (Signed)
Primary Care Physician: Moshe Cipro, MD Primary Cardiologist: Dr Gardiner Rhyme  Primary Electrophysiologist: Dr Rayann Heman Referring Physician: Dr Onalee Hua Savannah Hobbs is a 75 y.o. female with a history of atrial fibrillation, cirrhosis, hyperlipidemia, OSA, and atrial flutter who presents for follow up in the [facility].  The patient was initially diagnosed with atrial fibrillation in the setting of COVID infection 12/2018. Patient was hospitalized for recurrent atrial flutter 01/2020. She was seen by Dr Rayann Heman and started on PRN flecainide. Patient is on Eliquis for a CHADS2VASC score of 2.   On follow up today, patient continued to have symptomatic afib and underwent afib and flutter ablation with Dr Rayann Heman on 09/26/20. Patient reports that she has had some brief, infrequent "fluttering" in her chest but this is an improvement since before the ablation. She denies any CP, swallowing pain, or groin issues. Of note, she has been diagnosed with OSA and failed CPAP titration. She was scheduled for BiPap titration but cancelled.  Today, she denies symptoms of chest pain, shortness of breath, orthopnea, PND, lower extremity edema, dizziness, presyncope, syncope, bleeding, or neurologic sequela. The patient is tolerating medications without difficulties and is otherwise without complaint today.    Atrial Fibrillation Risk Factors:  she does have symptoms or diagnosis of sleep apnea. she does not have a history of rheumatic fever.   she has a BMI of Body mass index is 32.65 kg/m.Marland Kitchen Filed Weights   10/23/20 1525  Weight: 89 kg     Family History  Problem Relation Age of Onset   Esophageal cancer Father    Hypertension Mother    Colon polyps Neg Hx    Colon cancer Neg Hx    Rectal cancer Neg Hx    Stomach cancer Neg Hx      Atrial Fibrillation Management history:  Previous antiarrhythmic drugs: flecainide PRN Previous cardioversions: none Previous  ablations: 09/26/20 CHADS2VASC score: 2 Anticoagulation history: Eliquis   Past Medical History:  Diagnosis Date   Allergy    Arthritis    Asthma    Cirrhosis of liver ([place])    due to "medications for arthritis".    Elevated cholesterol    Erosive esophagitis 1995   Fatty liver    GERD (gastroesophageal reflux disease)    Hiatal hernia    LARGE   Hx of colonic polyps 05/27/2018   Hx of transfusion of platelets    Paroxysmal atrial fibrillation (HCC)    Thrombocytopenia (HCC)    Typical atrial flutter (HCC)    UTI (lower urinary tract infection)    Past Surgical History:  Procedure Laterality Date   ABDOMINAL HYSTERECTOMY     partial   APPENDECTOMY     ATRIAL FIBRILLATION ABLATION N/A 09/26/2020   Procedure: ATRIAL FIBRILLATION ABLATION;  Surgeon: Thompson Grayer, MD;  Location: [facility];  Service: Cardiovascular;  Laterality: N/A;   COLONOSCOPY     ESOPHAGOGASTRODUODENOSCOPY     GYNECOLOGIC CRYOSURGERY     x2   REPLACEMENT TOTAL KNEE Left 2016   TONSILLECTOMY     TOTAL KNEE ARTHROPLASTY Right    TUBAL LIGATION     UPPER GASTROINTESTINAL ENDOSCOPY      Current Outpatient Medications  Medication Sig Dispense Refill   apixaban (ELIQUIS) 5 MG TABS tablet Take 1 tablet (5 mg total) by mouth 2 (two) times daily. 180 tablet 1   Ascorbic Acid (VITAMIN C) 1000 MG tablet Take 1,000 mg by mouth daily.  atorvastatin (LIPITOR) 20 MG tablet Take 1 tablet (20 mg total) by mouth daily. 90 tablet 3   cetirizine (ZYRTEC) 10 MG tablet Take 10 mg by mouth in the morning.     Cholecalciferol (VITAMIN D-3) 125 MCG (5000 UT) TABS Take 5,000 Units by mouth daily.     diltiazem (CARDIZEM CD) 120 MG 24 hr capsule Take 1 capsule (120 mg total) by mouth daily. 90 capsule 3   docusate sodium (COLACE) 100 MG capsule Take 100 mg by mouth 2 (two) times daily.     esomeprazole (NEXIUM) 20 MG capsule Take 20 mg by mouth daily before breakfast.     ferrous sulfate 325 (65 FE) MG tablet Take  325 mg by mouth daily.     flecainide (TAMBOCOR) 100 MG tablet Take 2 tablets (200 mg total) by mouth as directed. Take 2 tablets as needed daily for Afib 10 tablet 1   furosemide (LASIX) 40 MG tablet Take 1 tablet (40 mg total) by mouth daily. 90 tablet 3   ibuprofen (ADVIL) 200 MG tablet Take 200 mg by mouth every 6 (six) hours as needed for mild pain (or headaches).     potassium chloride SA (KLOR-CON) 20 MEQ tablet Take 1 tablet (20 mEq total) by mouth daily. 90 tablet 3   vitamin B-12 (CYANOCOBALAMIN) 1000 MCG tablet Take 1,000 mcg by mouth daily.     No current facility-administered medications for this encounter.    Allergies  Allergen Reactions   Iodine Hives   Shellfish Allergy Hives    Social History   Socioeconomic History   Marital status: Married    Spouse name: Not on file   Number of children: 3   Years of education: Not on file   Highest education level: Not on file  Occupational History   Occupation: retired Therapist, sports  Tobacco Use   Smoking status: Never   Smokeless tobacco: Never  Substance and Sexual Activity   Alcohol use: Yes    Alcohol/week: 1.0 standard drink    Types: 1 Glasses of wine per week   Drug use: No   Sexual activity: Not on file  Other Topics Concern   Not on file  Social History Narrative   Married, 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   3 sons, 11/25/15 1 son died in his sleep unclear cause   Updated 12/02/2015   Social Determinants of Health   Financial Resource Strain: Not on file  Food Insecurity: Not on file  Transportation Needs: Not on file  Physical Activity: Not on file  Stress: Not on file  Social Connections: Not on file  Intimate Partner Violence: Not on file     ROS- All systems are reviewed and negative except as per the HPI above.  Physical Exam: Vitals:   10/23/20 1525  BP: (!) 146/68  Pulse: 98  Weight: 89 kg  Height: '5\' 5"'$  (1.651 m)    GEN- The patient is a well  appearing obese female, alert and oriented x 3 today.   HEENT-head normocephalic, atraumatic, sclera clear, conjunctiva pink, hearing intact, trachea midline. Lungs- Clear to ausculation bilaterally, normal work of breathing Heart- Regular rate and rhythm, no murmurs, rubs or gallops  GI- soft, NT, ND, + BS Extremities- no clubbing, cyanosis, or edema MS- no significant deformity or atrophy Skin- no rash or lesion Psych- euthymic mood, full affect Neuro- strength and sensation are intact   Wt Readings from Last 3 Encounters:  10/23/20 89 kg  09/26/20 91.6 kg  09/02/20 92.8 kg    EKG today demonstrates  SR Vent. rate 98 BPM PR interval 162 ms QRS duration 90 ms QT/QTcB 354/451 ms  Echo 01/23/20 demonstrated  1. Left ventricular ejection fraction, by estimation, is 60 to 65%. The  left ventricle has normal function. The left ventricle has no regional  wall motion abnormalities. Left ventricular diastolic function could not  be evaluated.   2. Right ventricular systolic function is normal. The right ventricular  size is normal.   3. Left atrial size was moderately dilated.   4. The trivial pericardial effusion is posterior to the left ventricle.   5. The mitral valve is abnormal. Mild mitral valve regurgitation.   6. The aortic valve is tricuspid. There is moderate calcification of the  aortic valve. Aortic valve regurgitation is not visualized. Mild aortic  valve stenosis. Aortic valve area, by VTI measures 1.59 cm. Aortic valve  mean gradient measures 11.0 mmHg.  Aortic valve Vmax measures 2.15 m/s.   7. The inferior vena cava is normal in size with <50% respiratory  variability, suggesting right atrial pressure of 8 mmHg.   Epic records are reviewed at length today  CHA2DS2-VASc Score = 4  The patient's score is based upon: CHF History: No HTN History: Yes Diabetes History: No Stroke History: No Vascular Disease History: Yes Age Score: 1 Gender Score: 1       ASSESSMENT AND PLAN: 1. Paroxysmal Atrial Fibrillation/atrial flutter The patient's CHA2DS2-VASc score is 4, indicating a 4.8% annual risk of stroke.   S/p afib and flutter ablation 09/26/20 Reassured patient that some breakthrough episodes of afib are not uncommon post ablation.  Continue flecainide 200 mg daily PRN for afib. Continue diltiazem 120 mg daily Continue Eliquis 5 mg BID  2. Secondary Hypercoagulable State (ICD10:  D68.69) The patient is at significant risk for stroke/thromboembolism based upon her CHA2DS2-VASc Score of 4.  Continue Apixaban (Eliquis).   3. Obesity Body mass index is 32.65 kg/m. Lifestyle modification was discussed and encouraged including regular physical activity and weight reduction.  4. OSA The importance of adequate treatment of sleep apnea was discussed today in order to improve our ability to maintain sinus rhythm long term. Also stressed importance of compliance with VPAP titration. Patient requesting referral to alternate sleep medicine. Will refer to Dr Maxwell Caul.   Follow up with Dr Rayann Heman as scheduled.    [facility] [address] [place], [place] [address] [phone] 10/23/2020 3:42 PM

## 2020-10-23 NOTE — Patient Instructions (Signed)
You have been referred to Dr.Osborne at [facility] for sleep apnea management.

## 2020-10-23 NOTE — Telephone Encounter (Signed)
Referral faxed to Dr.James [facility] (sleep medicine)  Fax # [phone]

## 2020-10-28 ENCOUNTER — Ambulatory Visit: Payer: Medicare Other | Admitting: Cardiology

## 2020-12-11 ENCOUNTER — Telehealth (HOSPITAL_COMMUNITY): Payer: Self-pay | Admitting: *Deleted

## 2020-12-11 NOTE — Telephone Encounter (Signed)
Received notification from Dr. Isidoro Donning office - pt declined to make an appt for sleep medicine.

## 2020-12-13 ENCOUNTER — Telehealth (HOSPITAL_COMMUNITY): Payer: Self-pay | Admitting: *Deleted

## 2020-12-13 NOTE — Telephone Encounter (Signed)
Patient called in stating about 2 weeks ago she had a fall at home on her deck from tripping has extensive bruising to her hip/leg area. Pt did not hit her head.  Her orthopedic states no broken bones but pt worried regarding bruising and Eliquis. Reassured pt bruising will be worsened on eliquis - recent ablation pt should not miss doses. Pt verbalized understanding.

## 2020-12-25 ENCOUNTER — Ambulatory Visit: Payer: Medicare Other | Admitting: Internal Medicine

## 2020-12-30 ENCOUNTER — Other Ambulatory Visit: Payer: Self-pay

## 2020-12-30 ENCOUNTER — Ambulatory Visit (INDEPENDENT_AMBULATORY_CARE_PROVIDER_SITE_OTHER): Payer: Medicare Other | Admitting: Internal Medicine

## 2020-12-30 VITALS — BP 142/70 | HR 86 | Ht 65.0 in | Wt 205.0 lb

## 2020-12-30 DIAGNOSIS — R0683 Snoring: Secondary | ICD-10-CM | POA: Diagnosis not present

## 2020-12-30 DIAGNOSIS — I480 Paroxysmal atrial fibrillation: Secondary | ICD-10-CM | POA: Diagnosis not present

## 2020-12-30 DIAGNOSIS — I4892 Unspecified atrial flutter: Secondary | ICD-10-CM | POA: Diagnosis not present

## 2020-12-30 NOTE — Patient Instructions (Addendum)
Medication Instructions:  Stop diltiazem  Your physician recommends that you continue on your current medications as directed. Please refer to the Current Medication list given to you today. *If you need a refill on your cardiac medications before your next appointment, please call your pharmacy*  Lab Work: None. If you have labs (blood work) drawn today and your tests are completely normal, you will receive your results only by: [place] (if you have MyChart) OR A paper copy in the mail If you have any lab test that is abnormal or we need to change your treatment, we will call you to review the results.  Testing/Procedures: None.  Follow-Up: At [facility], you and your health needs are our priority.  As part of our continuing mission to provide you with exceptional heart care, we have created designated Provider Care Teams.  These Care Teams include your primary Cardiologist (physician) and Advanced Practice Providers (APPs -  Physician Assistants and Nurse Practitioners) who all work together to provide you with the care you need, when you need it.  Your physician wants you to follow-up in: 3 months in the Afib Clinic. They will contact you to schedule.   We recommend signing up for the patient portal called "MyChart".  Sign up information is provided on this After Visit Summary.  MyChart is used to connect with patients for Virtual Visits (Telemedicine).  Patients are able to view lab/test results, encounter notes, upcoming appointments, etc.  Non-urgent messages can be sent to your provider as well.   To learn more about what you can do with MyChart, go to [email or web address].    Any Other Special Instructions Will Be Listed Below (If Applicable).

## 2020-12-30 NOTE — Progress Notes (Signed)
PCP: Moshe Cipro, MD Primary Cardiologist: Dr Richardo Hanks Corneshia Savannah Hobbs is a 75 y.o. female who presents today for routine electrophysiology followup.  Since his recent afib ablation, the patient reports doing very well.  she denies procedure related complications and is pleased with the results of the procedure.  Today, she denies symptoms of palpitations, chest pain, shortness of breath,  lower extremity edema, dizziness, presyncope, or syncope.  She fell about a month ago and injured her R shoulder.  This is slowly improving.  The patient is otherwise without complaint today.   Past Medical History:  Diagnosis Date   Allergy    Arthritis    Asthma    Cirrhosis of liver ([place])    due to "medications for arthritis".    Elevated cholesterol    Erosive esophagitis 1995   Fatty liver    GERD (gastroesophageal reflux disease)    Hiatal hernia    LARGE   Hx of colonic polyps 05/27/2018   Hx of transfusion of platelets    Paroxysmal atrial fibrillation (HCC)    Thrombocytopenia (HCC)    Typical atrial flutter (HCC)    UTI (lower urinary tract infection)    Past Surgical History:  Procedure Laterality Date   ABDOMINAL HYSTERECTOMY     partial   APPENDECTOMY     ATRIAL FIBRILLATION ABLATION N/A 09/26/2020   Procedure: ATRIAL FIBRILLATION ABLATION;  Surgeon: Thompson Grayer, MD;  Location: [place] [facility];  Service: Cardiovascular;  Laterality: N/A;   COLONOSCOPY     ESOPHAGOGASTRODUODENOSCOPY     GYNECOLOGIC CRYOSURGERY     x2   REPLACEMENT TOTAL KNEE Left 2016   TONSILLECTOMY     TOTAL KNEE ARTHROPLASTY Right    TUBAL LIGATION     UPPER GASTROINTESTINAL ENDOSCOPY      ROS- all systems are personally reviewed and negatives except as per HPI above  Current Outpatient Medications  Medication Sig Dispense Refill   Ascorbic Acid (VITAMIN C) 1000 MG tablet Take 1,000 mg by mouth daily.     cetirizine (ZYRTEC) 10 MG tablet Take 10 mg by mouth in the morning.      Cholecalciferol (VITAMIN D-3) 125 MCG (5000 UT) TABS Take 5,000 Units by mouth daily.     diltiazem (CARDIZEM CD) 120 MG 24 hr capsule Take 1 capsule (120 mg total) by mouth daily. 90 capsule 3   docusate sodium (COLACE) 100 MG capsule Take 100 mg by mouth 2 (two) times daily.     esomeprazole (NEXIUM) 20 MG capsule Take 20 mg by mouth daily before breakfast.     ferrous sulfate 325 (65 FE) MG tablet Take 325 mg by mouth daily.     flecainide (TAMBOCOR) 100 MG tablet Take 2 tablets (200 mg total) by mouth as directed. Take 2 tablets as needed daily for Afib 10 tablet 1   furosemide (LASIX) 40 MG tablet Take 1 tablet (40 mg total) by mouth daily. 90 tablet 3   ibuprofen (ADVIL) 200 MG tablet Take 200 mg by mouth every 6 (six) hours as needed for mild pain (or headaches).     vitamin B-12 (CYANOCOBALAMIN) 1000 MCG tablet Take 1,000 mcg by mouth daily.     apixaban (ELIQUIS) 5 MG TABS tablet Take 1 tablet (5 mg total) by mouth 2 (two) times daily. 180 tablet 1   atorvastatin (LIPITOR) 20 MG tablet Take 1 tablet (20 mg total) by mouth daily. 90 tablet 3   potassium chloride SA (KLOR-CON) 20 MEQ tablet  Take 1 tablet (20 mEq total) by mouth daily. 90 tablet 3   No current facility-administered medications for this visit.    Physical Exam: Vitals:   12/30/20 1403  BP: (!) 142/70  Pulse: 86  SpO2: 96%  Weight: 205 lb (93 kg)  Height: 5\' 5"  (1.651 m)    GEN- The patient is well appearing, alert and oriented x 3 today.   Head- normocephalic, atraumatic Eyes-  Sclera clear, conjunctiva pink Ears- hearing intact Oropharynx- clear Lungs- Clear to ausculation bilaterally, normal work of breathing Heart- Regular rate and rhythm, 2/6 SEM LUSB GI- soft, NT, ND, + BS Extremities- no clubbing, cyanosis,  + venous stasis changes  EKG tracing ordered today is personally reviewed and shows sinus  Assessment and Plan:  1. Paroxysmal atrial fibrillation/ atrial flutter Doing well s/p  ablation chads2vasc score is 4.  She is on eliquis Stop diltiazem  2. Overweight Body mass index is 34.11 kg/m. Lifestyle modification is advised  3. Mild OSA sleep study from 6/22 is reviewed and reveals mild OSA. She does not think that she would CPAP  4. HTN Elevated today She reports good BP control at home I have advised that she follow her BP closely at home off of diltiazem.   Return to see AF clinic in 3 months Follow-up with Dr Gardiner Rhyme in 6 months  Thompson Grayer MD, [facility] 12/30/2020 2:09 PM

## 2021-01-17 ENCOUNTER — Inpatient Hospital Stay (HOSPITAL_COMMUNITY)
Admission: EM | Admit: 2021-01-17 | Discharge: 2021-02-02 | DRG: 393 | Disposition: A | Discharge status: Home / Self Care | Payer: Medicare Other | Attending: Student in an Organized Health Care Education/Training Program | Admitting: Student in an Organized Health Care Education/Training Program

## 2021-01-17 ENCOUNTER — Other Ambulatory Visit: Payer: Self-pay

## 2021-01-17 ENCOUNTER — Encounter (HOSPITAL_COMMUNITY): Payer: Self-pay | Admitting: Emergency Medicine

## 2021-01-17 DIAGNOSIS — Z8249 Family history of ischemic heart disease and other diseases of the circulatory system: Secondary | ICD-10-CM

## 2021-01-17 DIAGNOSIS — E860 Dehydration: Secondary | ICD-10-CM | POA: Diagnosis present

## 2021-01-17 DIAGNOSIS — K766 Portal hypertension: Secondary | ICD-10-CM | POA: Diagnosis present

## 2021-01-17 DIAGNOSIS — L03316 Cellulitis of umbilicus: Secondary | ICD-10-CM | POA: Diagnosis present

## 2021-01-17 DIAGNOSIS — T501X5A Adverse effect of loop [high-ceiling] diuretics, initial encounter: Secondary | ICD-10-CM | POA: Diagnosis not present

## 2021-01-17 DIAGNOSIS — Z20822 Contact with and (suspected) exposure to covid-19: Secondary | ICD-10-CM | POA: Diagnosis present

## 2021-01-17 DIAGNOSIS — N390 Urinary tract infection, site not specified: Secondary | ICD-10-CM | POA: Diagnosis present

## 2021-01-17 DIAGNOSIS — D696 Thrombocytopenia, unspecified: Secondary | ICD-10-CM

## 2021-01-17 DIAGNOSIS — K7581 Nonalcoholic steatohepatitis (NASH): Secondary | ICD-10-CM | POA: Diagnosis present

## 2021-01-17 DIAGNOSIS — I480 Paroxysmal atrial fibrillation: Secondary | ICD-10-CM | POA: Diagnosis present

## 2021-01-17 DIAGNOSIS — R188 Other ascites: Secondary | ICD-10-CM | POA: Diagnosis present

## 2021-01-17 DIAGNOSIS — Z8616 Personal history of COVID-19: Secondary | ICD-10-CM

## 2021-01-17 DIAGNOSIS — Z800 Family history of malignant neoplasm of digestive organs: Secondary | ICD-10-CM

## 2021-01-17 DIAGNOSIS — Z96653 Presence of artificial knee joint, bilateral: Secondary | ICD-10-CM | POA: Diagnosis present

## 2021-01-17 DIAGNOSIS — R1033 Periumbilical pain: Secondary | ICD-10-CM

## 2021-01-17 DIAGNOSIS — Z6834 Body mass index (BMI) 34.0-34.9, adult: Secondary | ICD-10-CM

## 2021-01-17 DIAGNOSIS — D469 Myelodysplastic syndrome, unspecified: Secondary | ICD-10-CM | POA: Diagnosis present

## 2021-01-17 DIAGNOSIS — E8770 Fluid overload, unspecified: Secondary | ICD-10-CM | POA: Diagnosis not present

## 2021-01-17 DIAGNOSIS — K7460 Unspecified cirrhosis of liver: Secondary | ICD-10-CM | POA: Diagnosis present

## 2021-01-17 DIAGNOSIS — B9620 Unspecified Escherichia coli [E. coli] as the cause of diseases classified elsewhere: Secondary | ICD-10-CM | POA: Diagnosis present

## 2021-01-17 DIAGNOSIS — T391X5A Adverse effect of 4-Aminophenol derivatives, initial encounter: Secondary | ICD-10-CM | POA: Diagnosis present

## 2021-01-17 DIAGNOSIS — K429 Umbilical hernia without obstruction or gangrene: Secondary | ICD-10-CM

## 2021-01-17 DIAGNOSIS — Z79899 Other long term (current) drug therapy: Secondary | ICD-10-CM

## 2021-01-17 DIAGNOSIS — E785 Hyperlipidemia, unspecified: Secondary | ICD-10-CM | POA: Diagnosis present

## 2021-01-17 DIAGNOSIS — D6959 Other secondary thrombocytopenia: Secondary | ICD-10-CM | POA: Diagnosis present

## 2021-01-17 DIAGNOSIS — K219 Gastro-esophageal reflux disease without esophagitis: Secondary | ICD-10-CM | POA: Diagnosis present

## 2021-01-17 DIAGNOSIS — L02216 Cutaneous abscess of umbilicus: Secondary | ICD-10-CM | POA: Diagnosis present

## 2021-01-17 DIAGNOSIS — K420 Umbilical hernia with obstruction, without gangrene: Principal | ICD-10-CM | POA: Diagnosis present

## 2021-01-17 DIAGNOSIS — Z7901 Long term (current) use of anticoagulants: Secondary | ICD-10-CM

## 2021-01-17 DIAGNOSIS — Z90711 Acquired absence of uterus with remaining cervical stump: Secondary | ICD-10-CM

## 2021-01-17 DIAGNOSIS — Z91013 Allergy to seafood: Secondary | ICD-10-CM

## 2021-01-17 DIAGNOSIS — K7290 Hepatic failure, unspecified without coma: Secondary | ICD-10-CM

## 2021-01-17 DIAGNOSIS — D471 Chronic myeloproliferative disease: Secondary | ICD-10-CM

## 2021-01-17 DIAGNOSIS — L0291 Cutaneous abscess, unspecified: Secondary | ICD-10-CM

## 2021-01-17 DIAGNOSIS — L539 Erythematous condition, unspecified: Secondary | ICD-10-CM | POA: Diagnosis not present

## 2021-01-17 DIAGNOSIS — Z5329 Procedure and treatment not carried out because of patient's decision for other reasons: Secondary | ICD-10-CM | POA: Diagnosis not present

## 2021-01-17 DIAGNOSIS — Y92009 Unspecified place in unspecified non-institutional (private) residence as the place of occurrence of the external cause: Secondary | ICD-10-CM

## 2021-01-17 DIAGNOSIS — L03311 Cellulitis of abdominal wall: Secondary | ICD-10-CM | POA: Diagnosis present

## 2021-01-17 DIAGNOSIS — K659 Peritonitis, unspecified: Secondary | ICD-10-CM | POA: Diagnosis present

## 2021-01-17 LAB — COMPREHENSIVE METABOLIC PANEL
ALT: 17 U/L (ref 0–44)
AST: 39 U/L (ref 15–41)
Albumin: 3.1 g/dL — ABNORMAL LOW (ref 3.5–5.0)
Alkaline Phosphatase: 80 U/L (ref 38–126)
Anion gap: 8 (ref 5–15)
BUN: 12 mg/dL (ref 8–23)
CO2: 25 mmol/L (ref 22–32)
Calcium: 8.6 mg/dL — ABNORMAL LOW (ref 8.9–10.3)
Chloride: 101 mmol/L (ref 98–111)
Creatinine, Ser: 0.53 mg/dL (ref 0.44–1.00)
GFR, Estimated: >60 mL/min (ref >60)
Glucose, Bld: 115 mg/dL — ABNORMAL HIGH (ref 70–99)
Potassium: 3.6 mmol/L (ref 3.5–5.1)
Sodium: 134 mmol/L — ABNORMAL LOW (ref 135–145)
Total Bilirubin: 2.6 mg/dL — ABNORMAL HIGH (ref 0.3–1.2)
Total Protein: 7.2 g/dL (ref 6.5–8.1)

## 2021-01-17 LAB — CBC
HCT: 38.1 % (ref 36.0–46.0)
Hemoglobin: 12.3 g/dL (ref 12.0–15.0)
MCH: 29.1 pg (ref 26.0–34.0)
MCHC: 32.3 g/dL (ref 30.0–36.0)
MCV: 90.3 fL (ref 80.0–100.0)
Platelets: 124 10*3/uL — ABNORMAL LOW (ref 150–400)
RBC: 4.22 MIL/uL (ref 3.87–5.11)
RDW: 19 % — ABNORMAL HIGH (ref 11.5–15.5)
WBC: 34.1 10*3/uL — ABNORMAL HIGH (ref 4.0–10.5)
nRBC: 0 % (ref 0.0–0.2)

## 2021-01-17 LAB — LIPASE, BLOOD: Lipase: 35 U/L (ref 11–51)

## 2021-01-17 NOTE — ED Triage Notes (Signed)
Patient reports hypogastric/mid abdominal pain with swelling and nausea with fever onset yesterday .

## 2021-01-18 ENCOUNTER — Emergency Department (HOSPITAL_COMMUNITY): Payer: Medicare Other

## 2021-01-18 DIAGNOSIS — L03311 Cellulitis of abdominal wall: Secondary | ICD-10-CM

## 2021-01-18 HISTORY — DX: Cellulitis of abdominal wall: L03.311

## 2021-01-18 LAB — URINALYSIS, ROUTINE W REFLEX MICROSCOPIC
Bilirubin Urine: NEGATIVE
Glucose, UA: NEGATIVE mg/dL
Hgb urine dipstick: NEGATIVE
Ketones, ur: 5 mg/dL — AB
Leukocytes,Ua: NEGATIVE
Nitrite: NEGATIVE
Protein, ur: NEGATIVE mg/dL
Specific Gravity, Urine: 1.019 (ref 1.005–1.030)
pH: 5 (ref 5.0–8.0)

## 2021-01-18 LAB — RESP PANEL BY RT-PCR (FLU A&B, COVID) ARPGX2
Influenza A by PCR: NEGATIVE
Influenza B by PCR: NEGATIVE
SARS Coronavirus 2 by RT PCR: NEGATIVE

## 2021-01-18 LAB — MRSA NEXT GEN BY PCR, NASAL: MRSA by PCR Next Gen: NOT DETECTED

## 2021-01-18 LAB — LACTIC ACID, PLASMA: Lactic Acid, Venous: 1.8 mmol/L (ref 0.5–1.9)

## 2021-01-18 MED ORDER — ACETAMINOPHEN 325 MG PO TABS
650.0000 mg | ORAL_TABLET | Freq: Once | ORAL | Status: AC
  Administered 2021-01-18: 650 mg via ORAL
  Filled 2021-01-18: qty 2

## 2021-01-18 MED ORDER — VANCOMYCIN HCL 2000 MG/400ML IV SOLN
2000.0000 mg | Freq: Once | INTRAVENOUS | Status: AC
  Administered 2021-01-18: 2000 mg via INTRAVENOUS
  Filled 2021-01-18: qty 400

## 2021-01-18 MED ORDER — POLYETHYLENE GLYCOL 3350 17 G PO PACK: 17.0000 g | PACK | Freq: Every day | ORAL | Status: DC | PRN

## 2021-01-18 MED ORDER — SODIUM CHLORIDE 0.9 % IV BOLUS
1000.0000 mL | Freq: Once | INTRAVENOUS | Status: AC
  Administered 2021-01-18: 1000 mL via INTRAVENOUS

## 2021-01-18 MED ORDER — VANCOMYCIN HCL 1250 MG/250ML IV SOLN
1250.0000 mg | INTRAVENOUS | Status: DC
  Administered 2021-01-19: 1250 mg via INTRAVENOUS
  Filled 2021-01-18 (×2): qty 250

## 2021-01-18 MED ORDER — ACETAMINOPHEN 650 MG RE SUPP: 650.0000 mg | Freq: Four times a day (QID) | RECTAL | Status: DC | PRN

## 2021-01-18 MED ORDER — CEFTRIAXONE SODIUM 2 G IJ SOLR
2.0000 g | INTRAMUSCULAR | Status: DC
Start: 2021-01-19 — End: 2021-01-19
  Administered 2021-01-19: 2 g via INTRAVENOUS
  Filled 2021-01-18: qty 20

## 2021-01-18 MED ORDER — SODIUM CHLORIDE 0.9 % IV SOLN
2.0000 g | Freq: Once | INTRAVENOUS | Status: AC
  Administered 2021-01-18: 2 g via INTRAVENOUS
  Filled 2021-01-18: qty 20

## 2021-01-18 MED ORDER — LACTATED RINGERS IV SOLN: INTRAVENOUS | Status: DC

## 2021-01-18 MED ORDER — ATORVASTATIN CALCIUM 40 MG PO TABS
40.0000 mg | ORAL_TABLET | Freq: Every day | ORAL | Status: DC
  Administered 2021-01-18 – 2021-01-22 (×5): 40 mg via ORAL
  Filled 2021-01-18 (×5): qty 1

## 2021-01-18 MED ORDER — ACETAMINOPHEN 325 MG PO TABS
650.0000 mg | ORAL_TABLET | Freq: Four times a day (QID) | ORAL | Status: DC | PRN
  Administered 2021-01-18 – 2021-01-20 (×5): 650 mg via ORAL
  Filled 2021-01-18 (×4): qty 2

## 2021-01-18 MED ORDER — PANTOPRAZOLE SODIUM 40 MG PO TBEC
40.0000 mg | DELAYED_RELEASE_TABLET | Freq: Every day | ORAL | Status: DC
  Administered 2021-01-18 – 2021-02-02 (×16): 40 mg via ORAL
  Filled 2021-01-18 (×16): qty 1

## 2021-01-18 MED ORDER — APIXABAN 5 MG PO TABS
5.0000 mg | ORAL_TABLET | Freq: Two times a day (BID) | ORAL | Status: DC
  Administered 2021-01-18 – 2021-01-24 (×13): 5 mg via ORAL
  Filled 2021-01-18 (×13): qty 1

## 2021-01-18 MED ORDER — ATORVASTATIN CALCIUM 40 MG PO TABS
40.0000 mg | ORAL_TABLET | Freq: Every day | ORAL | Status: DC
  Filled 2021-01-18: qty 1

## 2021-01-18 NOTE — Progress Notes (Signed)
Pharmacy Antibiotic Note  Savannah Hobbs is a 75 y.o. female admitted on 01/17/2021 with cellulitis.  Pharmacy has been consulted for Vancomycin dosing.  Plan: Vancomycin 2000mg  x1 then 1500mg  IV q24h (eAUC 507, Cr rounded to 0.8mg /dL, Vd 0.5) Ceftriaxone per MD dosing -Monitor renal function, clinical status, and antibiotic plan -Order vanc levels as necessary  Height: 5\' 5"  (165.1 cm) Weight: 95 kg (209 lb 7 oz) IBW/kg (Calculated) : 57  Temp (24hrs), Avg:99.6 F (37.6 C), Min:98.9 F (37.2 C), Max:100.9 F (38.3 C)  Recent Labs  Lab 01/17/21 2225 01/18/21 0429  WBC 34.1*  --   CREATININE 0.53  --   LATICACIDVEN  --  1.8    Estimated Creatinine Clearance: 72.5 mL/min (by C-G formula based on SCr of 0.53 mg/dL).    Allergies  Allergen Reactions   Iodine Hives   Shellfish Allergy Hives    Antimicrobials this admission: Vanc 10/29 >>  Ceftriaxone 10/29 >>   Dose adjustments this admission: N/A  Microbiology results: 10/29 BCx: pending 10/29 MRSA PCR: pending  Thank you for allowing pharmacy to be a part of this patient's care.  Joetta Manners, PharmD, [facility] Clinical Pharmacist ED RPh Phone: [place]: [phone]

## 2021-01-18 NOTE — H&P (Addendum)
Date: 01/18/2021               Patient Name:  Savannah Hobbs MRN: [id number]  DOB: Jan 25, 1946 Age / Sex: 75 y.o., female   PCP: Moshe Cipro, MD         Medical Service: Internal Medicine Teaching Service         Attending Physician: Dr. Jimmye Norman, Elaina Pattee, MD    First Contact: Mitzie Na, MD Pager: MB [phone]  Second Contact: Jeanie Cooks, MD Pager: [place] [phone]       After Hours (After 5p/  First Contact Pager: [phone]  weekends / holidays): Second Contact Pager: [phone]   SUBJECTIVE  Chief Complaint: Abdominal Pain  History of Present Illness: Savannah Hobbs is a 75 y.o. female with a pertinent PMH of Cirrhosis, GERD, Hiatal hernia, and PAF who presents to [facility] with Abdominal pain.  History given by patient, and son at bedside.  Patient symptoms began approximately 2 days ago when she is feeling some slight abdominal pain.  She was experiencing symptoms of nausea but no vomiting.  Per her son she began to decline from her baseline, and slid from her bed onto the floor.  She is able to get up with assistance from her husband, but given a stomach "rash" that was the size of "my hand" the Ms. Florek's son brought her to the ED.  She endorses abdominal pain when she strains, and having fevers at 100.9 F.  She denies headaches, vomiting, shortness of breath, chest pain, constipation, or diarrhea.  On presentation to the ED, the patient was found to be in sinus tach on ekg, leukocytosis of 34 (BL in 20s), temperature of 99.98F, with erythema at the hernia site and across the abdominal wall. CT revealing for abdominal cellulitis.  Medications: No current facility-administered medications on file prior to encounter.   Current Outpatient Medications on File Prior to Encounter  Medication Sig Dispense Refill   acetaminophen (TYLENOL) 500 MG tablet Take 500 mg by mouth every 6 (six) hours as needed for moderate pain.     apixaban (ELIQUIS) 5 MG TABS tablet Take 1 tablet  (5 mg total) by mouth 2 (two) times daily. 180 tablet 1   Ascorbic Acid (VITAMIN C) 1000 MG tablet Take 1,000 mg by mouth daily.     atorvastatin (LIPITOR) 40 MG tablet Take 40 mg by mouth daily.     cetirizine (ZYRTEC) 10 MG tablet Take 10 mg by mouth in the morning.     Cholecalciferol (VITAMIN D-3) 125 MCG (5000 UT) TABS Take 5,000 Units by mouth daily.     docusate sodium (COLACE) 100 MG capsule Take 100 mg by mouth 2 (two) times daily.     esomeprazole (NEXIUM) 20 MG capsule Take 20 mg by mouth daily before breakfast.     ferrous sulfate 325 (65 FE) MG tablet Take 325 mg by mouth daily.     flecainide (TAMBOCOR) 100 MG tablet Take 2 tablets (200 mg total) by mouth as directed. Take 2 tablets as needed daily for Afib (Patient taking differently: Take 200 mg by mouth daily as needed (a-fib).) 10 tablet 1   furosemide (LASIX) 40 MG tablet Take 1 tablet (40 mg total) by mouth daily. 90 tablet 3   ibuprofen (ADVIL) 200 MG tablet Take 200 mg by mouth every 6 (six) hours as needed for mild pain (or headaches).     nitrofurantoin, macrocrystal-monohydrate, (MACROBID) 100 MG capsule Take 100 mg by mouth See admin instructions.  Bid x 7 days     potassium chloride SA (KLOR-CON) 20 MEQ tablet Take 1 tablet (20 mEq total) by mouth daily. 90 tablet 3   vitamin B-12 (CYANOCOBALAMIN) 1000 MCG tablet Take 1,000 mcg by mouth daily.     atorvastatin (LIPITOR) 20 MG tablet Take 1 tablet (20 mg total) by mouth daily. (Patient not taking: Reported on 01/18/2021) 90 tablet 3    Past Medical History:  Past Medical History:  Diagnosis Date   Allergy    Arthritis    Asthma    Cirrhosis of liver ([place])    due to "medications for arthritis".    Elevated cholesterol    Erosive esophagitis 1995   Fatty liver    GERD (gastroesophageal reflux disease)    Hiatal hernia    LARGE   Hx of colonic polyps 05/27/2018   Hx of transfusion of platelets    Paroxysmal atrial fibrillation (HCC)    Thrombocytopenia (HCC)     Typical atrial flutter (HCC)    UTI (lower urinary tract infection)     Social:  Social History   Socioeconomic History   Marital status: Married    Spouse name: Not on file   Number of children: 3   Years of education: Not on file   Highest education level: Not on file  Occupational History   Occupation: retired Therapist, sports  Tobacco Use   Smoking status: Never   Smokeless tobacco: Never  Substance and Sexual Activity   Alcohol use: Yes    Alcohol/week: 1.0 standard drink    Types: 1 Glasses of wine per week   Drug use: No   Sexual activity: Not on file  Other Topics Concern   Not on file  Social History Narrative   Married, 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  Drinks 1 glass of wine every 1 to 2 weeks.  Family History: Family History  Problem Relation Age of Onset   Esophageal cancer Father    Hypertension Mother    Colon polyps Neg Hx    Colon cancer Neg Hx    Rectal cancer Neg Hx    Stomach cancer Neg Hx     Allergies: Allergies as of 01/17/2021 - Review Complete 10/23/2020  Allergen Reaction Noted   Iodine Hives 05/10/2010   Shellfish allergy Hives 12/02/2015    Review of Systems: A complete ROS was negative except as per HPI.   OBJECTIVE:  Physical Exam: Blood pressure (!) 104/48, pulse 96, temperature 98.9 F (37.2 C), temperature source Oral, resp. rate  17, height 5\' 5"  (1.651 m), weight 95 kg, SpO2 93 %. Physical Exam Constitutional:      Appearance: She is obese. She is ill-appearing.     Comments: Easily arousable to verbal stimulation, answers questions appropriately, appears in mild distress.  Cardiovascular:     Rate  and Rhythm: Regular rhythm. Tachycardia present.     Pulses: Normal pulses.     Heart sounds:    No friction rub. No gallop.     Comments: 2/6 systolic heart murmur Pulmonary:     Effort: Pulmonary effort is normal.     Breath sounds: Normal breath sounds. No wheezing, rhonchi or rales.  Abdominal:     General: Abdomen is flat. There is no distension.     Tenderness: There is abdominal tenderness. There is no guarding or rebound.     Hernia: A hernia is present.     Comments: Bowel sounds present, mild tenderness to palpation of the mass.  Skin:    Comments: Periumbilical mass with surrounding erythema.  Neurological:     Mental Status: She is oriented to person, place, and time.    Pertinent Labs: CBC    Component Value Date/Time   WBC 34.1 (H) 01/17/2021 2225   RBC 4.22 01/17/2021 2225   HGB 12.3 01/17/2021 2225   HGB 11.1 09/02/2020 1521   HCT 38.1 01/17/2021 2225   HCT 33.9 (L) 09/02/2020 1521   PLT 124 (L) 01/17/2021 2225   PLT 118 (L) 09/02/2020 1521   MCV 90.3 01/17/2021 2225   MCV 83 09/02/2020 1521   MCH 29.1 01/17/2021 2225   MCHC 32.3 01/17/2021 2225   RDW 19.0 (H) 01/17/2021 2225   RDW 20.5 (H) 09/02/2020 1521   LYMPHSABS 0.9 09/02/2020 1521   MONOABS 0.9 01/23/2020 0222   EOSABS 0.2 09/02/2020 1521   BASOSABS 0.0 09/02/2020 1521     CMP     Component Value Date/Time   NA 134 (L) 01/17/2021 2225   NA 140 09/02/2020 1521   K 3.6 01/17/2021 2225   CL 101 01/17/2021 2225   CO2 25 01/17/2021 2225   GLUCOSE 115 (H) 01/17/2021 2225   BUN 12 01/17/2021 2225   BUN 9 09/02/2020 1521   CREATININE 0.53 01/17/2021 2225   CALCIUM 8.6 (L) 01/17/2021 2225   PROT 7.2 01/17/2021 2225    PROT 7.7 07/23/2020 1609   ALBUMIN 3.1 (L) 01/17/2021 2225   ALBUMIN 3.8 07/23/2020 1609   AST 39 01/17/2021 2225   ALT 17 01/17/2021 2225   ALKPHOS 80 01/17/2021 2225   BILITOT 2.6 (H) 01/17/2021 2225   BILITOT 1.0 07/23/2020 1609   GFRNONAA >60 01/17/2021 2225   GFRAA 118 03/05/2020 1629    Pertinent Imaging: CT ABDOMEN PELVIS WO CONTRAST  Result Date: 01/18/2021 CLINICAL DATA:  Abdominal pain with hernia suspected EXAM: CT ABDOMEN AND PELVIS WITHOUT CONTRAST TECHNIQUE: Multidetector CT imaging of the abdomen and pelvis was performed following the standard protocol without IV contrast. COMPARISON:  None. FINDINGS: Lower chest:  Borderline septal thickening at the bases. Hepatobiliary: Cirrhotic liver morphology with recanalized umbilical vein and splenomegaly.Cholelithiasis. No evidence of cholecystitis. Pancreas: Generalized atrophy Spleen: Enlarged with 22 cm craniocaudal span. Adrenals/Urinary Tract: Negative adrenals. No hydronephrosis or ureteral calculi. Two punctate left renal calculi. Depressed left kidney due to splenomegaly. Unremarkable bladder. Stomach/Bowel:  No obstruction. No bowel wall thickening. Vascular/Lymphatic: Varices in the upper abdomen. Atheromatous calcification of the aorta and branch vessels Reproductive:Hysterectomy. Other: Midline hernia containing ascitic fluid and congested fat. Diffuse subcutaneous edema of the anterior abdominal wall. Musculoskeletal: Osteopenia and lumbar spine degeneration. No emergent finding. IMPRESSION: 1. Large periumbilical hernia containing congested fat and ascitic fluid. 2. Anterior abdominal wall edema suggesting cellulitis. 3. Cirrhosis with portal hypertension. 4. Cholelithiasis and left nephrolithiasis. Electronically Signed   By: Roderic Palau  Watts M.D.   On: 01/18/2021 08:19    EKG: personally reviewed my interpretation is sinus tachycardia  ASSESSMENT & PLAN:  Assessment: Active Problems:   Abdominal wall  cellulitis   Asjia Berrios is a 75 y.o. with pertinent PMH of PAF, Hiatal hernia, cirrhosis, and GERD who presented with abdominal pain and admit for treatment of abdominal wall cellulitis on hospital day 0  Plan: #Nonpurulent Abdominal Wall Cellulitis #Fat-Containing Periumbilical Hernia Mild elevated temperature of 100.9 F, leukocytosis of 34, lactic acid of 1.8, and confirmed on CT imaging, with no strangulation of the hernia.  General surgery was consulted in the ED, no surgical intervention at this time.  Patient started on vancomycin and ceftriaxone in the ED. given patient's brief hospital stay and procedure in July of this year, at high risk for MRSA colonization, while the wound is nonpurulent, will continue vancomycin at this time.  Will obtain MRSA swab, and if negative will DC Vanco. - MRSA swab - Continue Vancomycin, will DC pending MRSA results - Continue Ceftriaxone 2 g daily - Trend CBCs - Trend fever curve  #Paroxysmal Atrial Fibrillation S/P Ablation 09/26/2020 #A. Flutter CHA2DS2-VASc score of 2, on Eliquis 5 mg twice daily and flecainide 200 mg as needed daily.  Atrial fibrillation appears to be in the setting of COVID-19 infection 2020, was found to be in atrial flutter in 2021, hospitalization.  She is status post ablation by Dr. Rayann Heman in July 2022.  She has not had to use her flecainide since her ablation.  EKG currently shows sinus tachycardia today. - Continue Eliquis 5 mg twice daily - Admit to med telemetry  #Cirrhosis with Portal Hypertension #Thrombocytopenia In the setting of medication use for knee pain.  Confirmed by Dr. Carlean Purl in 2018.  Appears well compensated at this point. Platelets at 124 today. - Trend CBCs  #Hyperlipidemia -Continue Lipitor 40 mg daily  #UTI Treated for UTI on 01/11/2021 with nitrofurantoin 100 mg twice daily.  Urinalysis today shows no discerning signs of UTI.   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 - Continue to monitor  Best Practice: Diet: Regular diet IVF: Fluids: LR, Rate: 150 cc an hour VTE:   Eliquis 5 mg twice daily Code: Full AB: Vanco and ceftriaxone Status: Observation with expected length of stay less than 2 midnights. Anticipated Discharge Location: Home Barriers to Discharge: Continue medical work-up  Signature: Maudie Mercury, MD Internal Medicine Resident, PGY-3 [facility]  10:30 AM, 01/18/2021   Please contact the on call pager after 5 pm and on weekends at [phone].

## 2021-01-18 NOTE — ED Notes (Signed)
Provider at bedside

## 2021-01-18 NOTE — ED Provider Notes (Signed)
[facility] EMERGENCY DEPARTMENT Provider Note   CSN: [id number] Arrival date & time: 01/17/21  2113     History Chief Complaint  Patient presents with   Abdominal Pain    Savannah Hobbs is a 75 y.o. female.  Patient presents to the emergency department for evaluation of abdominal pain.  Patient reports that symptoms began yesterday.  Patient reports pain around the umbilicus and also has noted a lump there.  She has a known hernia.  Patient has had fever, chills.  Patient's son is present, reports that she has been "loopy".  Patient reports nausea but has not had any vomiting.  She did have a bowel movement this afternoon.  Patient is currently on Macrobid for a UTI.      Past Medical History:  Diagnosis Date   Allergy    Arthritis    Asthma    Cirrhosis of liver ([place])    due to "medications for arthritis".    Elevated cholesterol    Erosive esophagitis 1995   Fatty liver    GERD (gastroesophageal reflux disease)    Hiatal hernia    LARGE   Hx of colonic polyps 05/27/2018   Hx of transfusion of platelets    Paroxysmal atrial fibrillation (HCC)    Thrombocytopenia (HCC)    Typical atrial flutter ([place])    UTI (lower urinary tract infection)     Patient Active Problem List   Diagnosis Date Noted   Paroxysmal atrial fibrillation ([place]) 04/16/2020   Secondary hypercoagulable state ([place]) 04/16/2020   Obesity 01/23/2020   Hepatic cirrhosis ([place]) 01/23/2020   Atrial flutter with rapid ventricular response ([place]) 01/22/2020   Hx of colonic polyps 05/27/2018    Past Surgical History:  Procedure Laterality Date   ABDOMINAL HYSTERECTOMY     partial   APPENDECTOMY     ATRIAL FIBRILLATION ABLATION N/A 09/26/2020   Procedure: ATRIAL FIBRILLATION ABLATION;  Surgeon: Thompson Grayer, MD;  Location: [place] [facility];  Service: Cardiovascular;  Laterality: N/A;   COLONOSCOPY     ESOPHAGOGASTRODUODENOSCOPY     GYNECOLOGIC CRYOSURGERY     x2   REPLACEMENT  TOTAL KNEE Left 2016   TONSILLECTOMY     TOTAL KNEE ARTHROPLASTY Right    TUBAL LIGATION     UPPER GASTROINTESTINAL ENDOSCOPY       OB History   No obstetric history on file.     Family History  Problem Relation Age of Onset   Esophageal cancer Father    Hypertension Mother    Colon polyps Neg Hx    Colon cancer Neg Hx    Rectal cancer Neg Hx    Stomach cancer Neg Hx     Social History   Tobacco Use   Smoking status: Never   Smokeless tobacco: Never  Substance Use Topics   Alcohol use: Yes    Alcohol/week: 1.0 standard drink    Types: 1 Glasses of wine per week   Drug use: No    Home Medications Prior to Admission medications   Medication Sig Start Date End Date Taking? Authorizing Provider  acetaminophen (TYLENOL) 500 MG tablet Take 500 mg by mouth every 6 (six) hours as needed for moderate pain.   Yes [provider]  apixaban (ELIQUIS) 5 MG TABS tablet Take 1 tablet (5 mg total) by mouth 2 (two) times daily. 02/06/20 01/18/21 Yes Donato Heinz, MD  Ascorbic Acid (VITAMIN C) 1000 MG tablet Take 1,000 mg by mouth daily.  Yes [provider]  atorvastatin (LIPITOR) 40 MG tablet Take 40 mg by mouth daily.   Yes [provider]  cetirizine (ZYRTEC) 10 MG tablet Take 10 mg by mouth in the morning.   Yes [provider]  Cholecalciferol (VITAMIN D-3) 125 MCG (5000 UT) TABS Take 5,000 Units by mouth daily.   Yes [provider]  docusate sodium (COLACE) 100 MG capsule Take 100 mg by mouth 2 (two) times daily.   Yes [provider]  esomeprazole (NEXIUM) 20 MG capsule Take 20 mg by mouth daily before breakfast.   Yes [provider]  ferrous sulfate 325 (65 FE) MG tablet Take 325 mg by mouth daily. 07/01/20  Yes [provider]  flecainide (TAMBOCOR) 100 MG tablet Take 2 tablets (200 mg total) by mouth as directed. Take 2 tablets as needed daily for Afib Patient taking differently: Take 200 mg  by mouth daily as needed (a-fib). 02/01/20  Yes Allred, Jeneen Rinks, MD  furosemide (LASIX) 40 MG tablet Take 1 tablet (40 mg total) by mouth daily. 02/26/20 02/20/21 Yes Donato Heinz, MD  ibuprofen (ADVIL) 200 MG tablet Take 200 mg by mouth every 6 (six) hours as needed for mild pain (or headaches).   Yes [provider]  nitrofurantoin, macrocrystal-monohydrate, (MACROBID) 100 MG capsule Take 100 mg by mouth See admin instructions. Bid x 7 days 01/11/21  Yes [provider]  potassium chloride SA (KLOR-CON) 20 MEQ tablet Take 1 tablet (20 mEq total) by mouth daily. 02/27/20 01/18/21 Yes Donato Heinz, MD  vitamin B-12 (CYANOCOBALAMIN) 1000 MCG tablet Take 1,000 mcg by mouth daily.   Yes [provider]  atorvastatin (LIPITOR) 20 MG tablet Take 1 tablet (20 mg total) by mouth daily. Patient not taking: Reported on 01/18/2021 02/26/20 10/23/20  Donato Heinz, MD    Allergies    Iodine and Shellfish allergy  Review of Systems   Review of Systems  Constitutional:  Positive for fever.  Gastrointestinal:  Positive for abdominal pain and nausea.  Psychiatric/Behavioral:  Positive for confusion.   All other systems reviewed and are negative.  Physical Exam Updated Vital Signs BP 129/60   Pulse (!) 103   Temp (!) 100.9 F (38.3 C) (Oral)   Resp (!) 22   Ht 5\' 5"  (1.651 m)   Wt 95 kg   SpO2 95%   BMI 34.85 kg/m   Physical Exam Vitals and nursing note reviewed.  Constitutional:      General: She is not in acute distress.    Appearance: Normal appearance. She is well-developed.  HENT:     Head: Normocephalic and atraumatic.     Right Ear: Hearing normal.     Left Ear: Hearing normal.     Nose: Nose normal.  Eyes:     Conjunctiva/sclera: Conjunctivae normal.     Pupils: Pupils are equal, round, and reactive to light.  Cardiovascular:     Rate and Rhythm: Regular rhythm.     Heart sounds: S1 normal and S2 normal. No murmur heard.   No  friction rub. No gallop.  Pulmonary:     Effort: Pulmonary effort is normal. No respiratory distress.     Breath sounds: Normal breath sounds.  Chest:     Chest wall: No tenderness.  Abdominal:     General: Bowel sounds are normal.     Palpations: Abdomen is soft.     Tenderness: There is abdominal tenderness in the periumbilical area. There is no  guarding or rebound. Negative signs include Murphy's sign and McBurney's sign.     Comments: Tender fluctuant mass left side of umbilicus  Musculoskeletal:        General: Normal range of motion.     Cervical back: Normal range of motion and neck supple.  Skin:    General: Skin is warm and dry.     Findings: Erythema (periumbilical) present. No rash.  Neurological:     Mental Status: She is alert and oriented to person, place, and time.     GCS: GCS eye subscore is 4. GCS verbal subscore is 5. GCS motor subscore is 6.     Cranial Nerves: No cranial nerve deficit.     Sensory: No sensory deficit.     Coordination: Coordination normal.  Psychiatric:        Speech: Speech normal.        Behavior: Behavior normal.        Thought Content: Thought content normal.    ED Results / Procedures / Treatments   Labs (all labs ordered are listed, but only abnormal results are displayed) Labs Reviewed  COMPREHENSIVE METABOLIC PANEL - Abnormal; Notable for the following components:      Result Value   Sodium 134 (*)    Glucose, Bld 115 (*)    Calcium 8.6 (*)    Albumin 3.1 (*)    Total Bilirubin 2.6 (*)    All other components within normal limits  CBC - Abnormal; Notable for the following components:   WBC 34.1 (*)    RDW 19.0 (*)    Platelets 124 (*)    All other components within normal limits  RESP PANEL BY RT-PCR (FLU A&B, COVID) ARPGX2  CULTURE, BLOOD (SINGLE)  LIPASE, BLOOD  LACTIC ACID, PLASMA  URINALYSIS, ROUTINE W REFLEX MICROSCOPIC    EKG EKG Interpretation  Date/Time:  Friday January 17 2021 22:05:34 EDT Ventricular  Rate:  107 PR Interval:  152 QRS Duration: 88 QT Interval:  340 QTC Calculation: 453 R Axis:   -15 Text Interpretation: Sinus tachycardia Otherwise normal ECG Confirmed by Orpah Greek [phone]) on 01/18/2021 4:04:27 AM  Radiology No results found.  Procedures Procedures   Medications Ordered in ED Medications  lactated ringers infusion (has no administration in time range)  vancomycin (VANCOREADY) IVPB 2000 mg/400 mL (2,000 mg Intravenous New Bag/Given 01/18/21 1761)  sodium chloride 0.9 % bolus 1,000 mL (0 mLs Intravenous Stopped 01/18/21 0656)  cefTRIAXone (ROCEPHIN) 2 g in sodium chloride 0.9 % 100 mL IVPB (0 g Intravenous Stopped 01/18/21 6073)    ED Course  I have reviewed the triage vital signs and the nursing notes.  Pertinent labs & imaging results that were available during my care of the patient were reviewed by me and considered in my medical decision making (see chart for details).    MDM Rules/Calculators/A&P                           Patient presents to the emergency department for evaluation of abdominal pain.  Patient reports that she has a known umbilical hernia.  Examination does reveal a tender mass to the left of the umbilicus.  This area is fluctuant, however and there is surrounding erythema.  Patient noted to be febrile.  Favor abscess with cellulitis but cannot rule out incarcerated hernia.  Covered empirically with antibiotics, will perform CT scan to further delineate.  Signout to oncoming ER physician.  Final  Clinical Impression(s) / ED Diagnoses Final diagnoses:  Periumbilical abdominal pain    Rx / DC Orders ED Discharge Orders     None        Alondria Mousseau, Gwenyth Allegra, MD 01/18/21 [phone]

## 2021-01-18 NOTE — ED Notes (Signed)
Pt umbilical area has 3+ swelling, pt's abdomen from mid to lower abdomen is erythematous and hot to touch.

## 2021-01-18 NOTE — Consult Note (Signed)
Consulting Physician: Nickola Major Mareli Antunes  Referring Provider: Dr. Francia Greaves, ER provider  Chief Complaint: Abdominal pain  Reason for Consult: Incarcerated periumbilical incisional hernia   Subjective   HPI: Savannah Hobbs is an 75 y.o. female who is here for abdominal pain.  Savannah Hobbs has had a umbilical hernia ever since a laparoscopic procedure long time ago.  It has not really caused her much issue until the last couple days.  It is turned red firm painful bringing her into the emergency department.  She continues to have gas and bowel movements.  She is able to eat without nausea or vomiting, but she does not much of an appetite.  Past Medical History:  Diagnosis Date   Allergy    Arthritis    Asthma    Cirrhosis of liver ([place])    due to "medications for arthritis".    Elevated cholesterol    Erosive esophagitis 1995   Fatty liver    GERD (gastroesophageal reflux disease)    Hiatal hernia    LARGE   Hx of colonic polyps 05/27/2018   Hx of transfusion of platelets    Paroxysmal atrial fibrillation (HCC)    Thrombocytopenia (HCC)    Typical atrial flutter (HCC)    UTI (lower urinary tract infection)     Past Surgical History:  Procedure Laterality Date   ABDOMINAL HYSTERECTOMY     partial   APPENDECTOMY     ATRIAL FIBRILLATION ABLATION N/A 09/26/2020   Procedure: ATRIAL FIBRILLATION ABLATION;  Surgeon: Thompson Grayer, MD;  Location: [facility] [place] [facility];  Service: Cardiovascular;  Laterality: N/A;   COLONOSCOPY     ESOPHAGOGASTRODUODENOSCOPY     GYNECOLOGIC CRYOSURGERY     x2   REPLACEMENT TOTAL KNEE Left 2016   TONSILLECTOMY     TOTAL KNEE ARTHROPLASTY Right    TUBAL LIGATION     UPPER GASTROINTESTINAL ENDOSCOPY      Family History  Problem Relation Age of Onset   Esophageal cancer Father    Hypertension Mother    Colon polyps Neg Hx    Colon cancer Neg Hx    Rectal cancer Neg Hx    Stomach cancer Neg Hx     Social:  reports that she has never  smoked. She has never used smokeless tobacco. She reports current alcohol use of about 1.0 standard drink per week. She reports that she does not use drugs.  Allergies:  Allergies  Allergen Reactions   Iodine Hives   Shellfish Allergy Hives    Medications: Current Outpatient Medications  Medication Instructions   acetaminophen (TYLENOL) 500 mg, Oral, Every 6 hours PRN   apixaban (ELIQUIS) 5 mg, Oral, 2 times daily   atorvastatin (LIPITOR) 20 mg, Oral, Daily   atorvastatin (LIPITOR) 40 mg, Oral, Daily   cetirizine (ZYRTEC) 10 mg, Oral, Every morning   docusate sodium (COLACE) 100 mg, Oral, 2 times daily   esomeprazole (NEXIUM) 20 mg, Oral, Daily before breakfast   ferrous sulfate 325 mg, Oral, Daily   flecainide (TAMBOCOR) 200 mg, Oral, As directed, Take 2 tablets as needed daily for Afib   furosemide (LASIX) 40 mg, Oral, Daily   ibuprofen (ADVIL) 200 mg, Oral, Every 6 hours PRN   potassium chloride SA (KLOR-CON) 20 MEQ tablet 20 mEq, Oral, Daily   vitamin B-12 (CYANOCOBALAMIN) 1,000 mcg, Oral, Daily   vitamin C 1,000 mg, Oral, Daily   Vitamin D-3 5,000 Units, Oral, Daily    ROS - all of the below systems  have been reviewed with the patient and positives are indicated with bold text General: chills, fever or night sweats Eyes: blurry vision or double vision ENT: epistaxis or sore throat Allergy/Immunology: itchy/watery eyes or nasal congestion Hematologic/Lymphatic: bleeding problems, blood clots or swollen lymph nodes Endocrine: temperature intolerance or unexpected weight changes Breast: new or changing breast lumps or nipple discharge Resp: cough, shortness of breath, or wheezing CV: chest pain or dyspnea on exertion GI: as per HPI GU: dysuria, trouble voiding, or hematuria MSK: joint pain or joint stiffness Neuro: TIA or stroke symptoms Derm: pruritus and skin lesion changes Psych: anxiety and depression  Objective   PE Blood pressure (!) 123/57, pulse 96,  temperature 98.9 F (37.2 C), temperature source Oral, resp. rate 16, height 5\' 5"  (1.651 m), weight 95 kg, SpO2 93 %. Constitutional: NAD; conversant; no deformities Eyes: Moist conjunctiva; no lid lag; anicteric; PERRL Neck: Trachea midline; no thyromegaly Lungs: Normal respiratory effort; no tactile fremitus CV: RRR; no palpable thrills; no pitting edema GI: Abd erythematous indurated inflamed firm round umbilical hernia that is tender to palpation, unable to reduce.  No caput medusa visible on exam MSK: Normal range of motion of extremities; no clubbing/cyanosis Psychiatric: Appropriate affect; alert and oriented x3 Lymphatic: No palpable cervical or axillary lymphadenopathy  Results for orders placed or performed during the hospital encounter of 01/17/21 (from the past 24 hour(s))  Lipase, blood     Status: None   Collection Time: 01/17/21 10:25 PM  Result Value Ref Range   Lipase 35 11 - 51 U/L  Comprehensive metabolic panel     Status: Abnormal   Collection Time: 01/17/21 10:25 PM  Result Value Ref Range   Sodium 134 (L) 135 - 145 mmol/L   Potassium 3.6 3.5 - 5.1 mmol/L   Chloride 101 98 - 111 mmol/L   CO2 25 22 - 32 mmol/L   Glucose, Bld 115 (H) 70 - 99 mg/dL   BUN 12 8 - 23 mg/dL   Creatinine, Ser 0.53 0.44 - 1.00 mg/dL   Calcium 8.6 (L) 8.9 - 10.3 mg/dL   Total Protein 7.2 6.5 - 8.1 g/dL   Albumin 3.1 (L) 3.5 - 5.0 g/dL   AST 39 15 - 41 U/L   ALT 17 0 - 44 U/L   Alkaline Phosphatase 80 38 - 126 U/L   Total Bilirubin 2.6 (H) 0.3 - 1.2 mg/dL   GFR, Estimated >60 >60 mL/min   Anion gap 8 5 - 15  CBC     Status: Abnormal   Collection Time: 01/17/21 10:25 PM  Result Value Ref Range   WBC 34.1 (H) 4.0 - 10.5 K/uL   RBC 4.22 3.87 - 5.11 MIL/uL   Hemoglobin 12.3 12.0 - 15.0 g/dL   HCT 38.1 36.0 - 46.0 %   MCV 90.3 80.0 - 100.0 fL   MCH 29.1 26.0 - 34.0 pg   MCHC 32.3 30.0 - 36.0 g/dL   RDW 19.0 (H) 11.5 - 15.5 %   Platelets 124 (L) 150 - 400 K/uL   nRBC 0.0 0.0 - 0.2  %  Lactic acid, plasma     Status: None   Collection Time: 01/18/21  4:29 AM  Result Value Ref Range   Lactic Acid, Venous 1.8 0.5 - 1.9 mmol/L  Resp Panel by RT-PCR (Flu A&B, Covid) Nasopharyngeal Swab     Status: None   Collection Time: 01/18/21  4:40 AM   Specimen: Nasopharyngeal Swab; Nasopharyngeal(NP) swabs in vial transport medium  Result  Value Ref Range   SARS Coronavirus 2 by RT PCR NEGATIVE NEGATIVE   Influenza A by PCR NEGATIVE NEGATIVE   Influenza B by PCR NEGATIVE NEGATIVE  Urinalysis, Routine w reflex microscopic     Status: Abnormal   Collection Time: 01/18/21  7:26 AM  Result Value Ref Range   Color, Urine AMBER (A) YELLOW   APPearance HAZY (A) CLEAR   Specific Gravity, Urine 1.019 1.005 - 1.030   pH 5.0 5.0 - 8.0   Glucose, UA NEGATIVE NEGATIVE mg/dL   Hgb urine dipstick NEGATIVE NEGATIVE   Bilirubin Urine NEGATIVE NEGATIVE   Ketones, ur 5 (A) NEGATIVE mg/dL   Protein, ur NEGATIVE NEGATIVE mg/dL   Nitrite NEGATIVE NEGATIVE   Leukocytes,Ua NEGATIVE NEGATIVE     Imaging Orders         CT ABDOMEN PELVIS WO CONTRAST      Assessment and Plan   Savannah Hobbs is an 75 y.o. female with cirrhosis with portal hypertension, thrombocytopenia and a incarcerated umbilical hernia containing omentum.    It appears she has a strangulated piece of omentum in her hernia.  This is a painful process, but eventually the omentum will die and hopefully plug this hernia making it difficult for any bowel to strangulated within this hernia.  I would not recommend operating on this patient without optimization of her liver disease.  She is at high risk for complication from any abdominal surgery due to her cirrhosis with portal hypertension and thrombocytopenia.  CT scan is impressive for large recannulated umbilical vein and the falciform ligament.  I do not see evidence of infection in her work-up.  The erythema is likely from the inflammatory process of the omentum dying.   Antibiotics may be able to be stopped soon.  The pain of the acute strangulation of the omentum may last a week or two, but eventually the area will just be hard and nonpainful.  Remainder of care per primary team.    ICD-10-CM   1. Periumbilical abdominal pain  R10.33        Felicie Morn, MD  [facility] Use [email or web address] to contact on call provider

## 2021-01-18 NOTE — ED Notes (Signed)
Patient transported to CT 

## 2021-01-18 NOTE — Sepsis Progress Note (Signed)
Monitoring for the code sepsis protocol. °

## 2021-01-18 NOTE — ED Provider Notes (Signed)
Patient is seen after prior EDP.  Patient's CT demonstrates incarcerated fat.  No bowel is incarcerated.  Case and CT findings discussed with general surgery.  They do not feel the patient is a candidate for surgical intervention at this time.  Patient would benefit from admission to medicine service for further work-up and observation.  Medicine services aware of case and will evaluate for admission.  Patient and family at bedside understand plan of care.   Valarie Merino, MD 01/18/21 [phone]

## 2021-01-19 DIAGNOSIS — Z79899 Other long term (current) drug therapy: Secondary | ICD-10-CM | POA: Diagnosis not present

## 2021-01-19 DIAGNOSIS — K429 Umbilical hernia without obstruction or gangrene: Secondary | ICD-10-CM | POA: Diagnosis not present

## 2021-01-19 DIAGNOSIS — K7290 Hepatic failure, unspecified without coma: Secondary | ICD-10-CM | POA: Diagnosis not present

## 2021-01-19 DIAGNOSIS — I480 Paroxysmal atrial fibrillation: Secondary | ICD-10-CM | POA: Diagnosis present

## 2021-01-19 DIAGNOSIS — K7581 Nonalcoholic steatohepatitis (NASH): Secondary | ICD-10-CM | POA: Diagnosis present

## 2021-01-19 DIAGNOSIS — B9620 Unspecified Escherichia coli [E. coli] as the cause of diseases classified elsewhere: Secondary | ICD-10-CM | POA: Diagnosis present

## 2021-01-19 DIAGNOSIS — Y92009 Unspecified place in unspecified non-institutional (private) residence as the place of occurrence of the external cause: Secondary | ICD-10-CM | POA: Diagnosis not present

## 2021-01-19 DIAGNOSIS — E785 Hyperlipidemia, unspecified: Secondary | ICD-10-CM | POA: Diagnosis present

## 2021-01-19 DIAGNOSIS — K766 Portal hypertension: Secondary | ICD-10-CM | POA: Diagnosis present

## 2021-01-19 DIAGNOSIS — D469 Myelodysplastic syndrome, unspecified: Secondary | ICD-10-CM | POA: Diagnosis present

## 2021-01-19 DIAGNOSIS — K460 Unspecified abdominal hernia with obstruction, without gangrene: Secondary | ICD-10-CM | POA: Diagnosis not present

## 2021-01-19 DIAGNOSIS — K219 Gastro-esophageal reflux disease without esophagitis: Secondary | ICD-10-CM | POA: Diagnosis present

## 2021-01-19 DIAGNOSIS — K659 Peritonitis, unspecified: Secondary | ICD-10-CM | POA: Diagnosis present

## 2021-01-19 DIAGNOSIS — Z8616 Personal history of COVID-19: Secondary | ICD-10-CM | POA: Diagnosis not present

## 2021-01-19 DIAGNOSIS — E860 Dehydration: Secondary | ICD-10-CM | POA: Diagnosis present

## 2021-01-19 DIAGNOSIS — Z20822 Contact with and (suspected) exposure to covid-19: Secondary | ICD-10-CM | POA: Diagnosis present

## 2021-01-19 DIAGNOSIS — T391X5A Adverse effect of 4-Aminophenol derivatives, initial encounter: Secondary | ICD-10-CM | POA: Diagnosis present

## 2021-01-19 DIAGNOSIS — L03316 Cellulitis of umbilicus: Secondary | ICD-10-CM | POA: Diagnosis present

## 2021-01-19 DIAGNOSIS — D696 Thrombocytopenia, unspecified: Secondary | ICD-10-CM | POA: Diagnosis not present

## 2021-01-19 DIAGNOSIS — R1033 Periumbilical pain: Secondary | ICD-10-CM | POA: Diagnosis present

## 2021-01-19 DIAGNOSIS — L02216 Cutaneous abscess of umbilicus: Secondary | ICD-10-CM | POA: Diagnosis present

## 2021-01-19 DIAGNOSIS — K7469 Other cirrhosis of liver: Secondary | ICD-10-CM | POA: Diagnosis not present

## 2021-01-19 DIAGNOSIS — Z91013 Allergy to seafood: Secondary | ICD-10-CM | POA: Diagnosis not present

## 2021-01-19 DIAGNOSIS — K7460 Unspecified cirrhosis of liver: Secondary | ICD-10-CM | POA: Diagnosis present

## 2021-01-19 DIAGNOSIS — D6959 Other secondary thrombocytopenia: Secondary | ICD-10-CM | POA: Diagnosis present

## 2021-01-19 DIAGNOSIS — Z6834 Body mass index (BMI) 34.0-34.9, adult: Secondary | ICD-10-CM | POA: Diagnosis not present

## 2021-01-19 DIAGNOSIS — R188 Other ascites: Secondary | ICD-10-CM | POA: Diagnosis present

## 2021-01-19 DIAGNOSIS — L0291 Cutaneous abscess, unspecified: Secondary | ICD-10-CM | POA: Diagnosis not present

## 2021-01-19 DIAGNOSIS — L03311 Cellulitis of abdominal wall: Secondary | ICD-10-CM | POA: Diagnosis present

## 2021-01-19 DIAGNOSIS — K420 Umbilical hernia with obstruction, without gangrene: Secondary | ICD-10-CM | POA: Diagnosis present

## 2021-01-19 DIAGNOSIS — N390 Urinary tract infection, site not specified: Secondary | ICD-10-CM | POA: Diagnosis present

## 2021-01-19 LAB — COMPREHENSIVE METABOLIC PANEL
ALT: 15 U/L (ref 0–44)
AST: 27 U/L (ref 15–41)
Albumin: 2.5 g/dL — ABNORMAL LOW (ref 3.5–5.0)
Alkaline Phosphatase: 66 U/L (ref 38–126)
Anion gap: 5 (ref 5–15)
BUN: 10 mg/dL (ref 8–23)
CO2: 26 mmol/L (ref 22–32)
Calcium: 8.2 mg/dL — ABNORMAL LOW (ref 8.9–10.3)
Chloride: 104 mmol/L (ref 98–111)
Creatinine, Ser: 0.43 mg/dL — ABNORMAL LOW (ref 0.44–1.00)
GFR, Estimated: >60 mL/min (ref >60)
Glucose, Bld: 96 mg/dL (ref 70–99)
Potassium: 3.5 mmol/L (ref 3.5–5.1)
Sodium: 135 mmol/L (ref 135–145)
Total Bilirubin: 1.7 mg/dL — ABNORMAL HIGH (ref 0.3–1.2)
Total Protein: 6.1 g/dL — ABNORMAL LOW (ref 6.5–8.1)

## 2021-01-19 LAB — CBC
HCT: 34.2 % — ABNORMAL LOW (ref 36.0–46.0)
Hemoglobin: 11 g/dL — ABNORMAL LOW (ref 12.0–15.0)
MCH: 29.2 pg (ref 26.0–34.0)
MCHC: 32.2 g/dL (ref 30.0–36.0)
MCV: 90.7 fL (ref 80.0–100.0)
Platelets: 109 10*3/uL — ABNORMAL LOW (ref 150–400)
RBC: 3.77 MIL/uL — ABNORMAL LOW (ref 3.87–5.11)
RDW: 19 % — ABNORMAL HIGH (ref 11.5–15.5)
WBC: 25.8 10*3/uL — ABNORMAL HIGH (ref 4.0–10.5)
nRBC: 0 % (ref 0.0–0.2)

## 2021-01-19 MED ORDER — PIPERACILLIN-TAZOBACTAM 3.375 G IVPB
3.3750 g | Freq: Three times a day (TID) | INTRAVENOUS | Status: AC
  Administered 2021-01-19 – 2021-01-21 (×5): 3.375 g via INTRAVENOUS
  Filled 2021-01-19 (×5): qty 50

## 2021-01-19 MED ORDER — POLYETHYLENE GLYCOL 3350 17 G PO PACK
17.0000 g | PACK | Freq: Every day | ORAL | Status: DC
  Administered 2021-01-21 – 2021-01-28 (×7): 17 g via ORAL
  Filled 2021-01-19 (×14): qty 1

## 2021-01-19 MED ORDER — LACTATED RINGERS IV BOLUS
500.0000 mL | Freq: Once | INTRAVENOUS | Status: AC
  Administered 2021-01-19: 500 mL via INTRAVENOUS

## 2021-01-19 MED ORDER — TRAMADOL HCL 50 MG PO TABS
50.0000 mg | ORAL_TABLET | Freq: Four times a day (QID) | ORAL | Status: DC | PRN
  Administered 2021-01-19: 50 mg via ORAL
  Filled 2021-01-19: qty 1

## 2021-01-19 MED ORDER — SENNA 8.6 MG PO TABS
2.0000 | ORAL_TABLET | Freq: Every day | ORAL | Status: DC
  Administered 2021-01-21 – 2021-01-28 (×7): 17.2 mg via ORAL
  Filled 2021-01-19 (×15): qty 2

## 2021-01-19 NOTE — Progress Notes (Signed)
Subjective: The patient was seen during rounds this AM. She states her pain was well-controlled this morning while sitting up in bed. It still appears erythematous to her. While lying down, she endorses a shooting pain. She is passing flatus. Overall, her pain has improved since yesterday. She says it is hard to tell whether the hernia feels hard to her or not.   Patient follows with Dr. Currie Paris in [facility] cardiologist in [place].  Reports cirrhosis secondary to taking pain medications for joint pains in the past reports high-dose Tylenol use and Osteo Bi-Flex. no other complaints or concerns at this time.   Objective:  Vital signs in last 24 hours: Vitals:   01/18/21 2124 01/19/21 0025 01/19/21 0502 01/19/21 1125  BP: (!) 128/45 (!) 133/50 (!) 123/48 (!) 140/59  Pulse: 95 93 88 97  Resp: 19 18 18 18   Temp: 99.2 F (37.3 C) 99.9 F (37.7 C) 98.8 F (37.1 C) 98.1 F (36.7 C)  TempSrc: Oral Oral Oral Oral  SpO2: 94% 94% 92% 96%  Weight: 90.8 kg  90.8 kg   Height: 5\' 5"  (1.651 m)      Constitutional: Patient laying in bed appears well-nourished in no acute distress.  Drinking water from cup.  Obese HENT: Normocephalic and atraumatic, EOMI, very dry mucous membranes Cardiovascular: Normal rate, regular rhythm, systolic murmur best heard at the left upper sternal border Respiratory: No respiratory distress, no accessory muscle use.  Effort is normal.  Lungs are clear to auscultation bilaterally. GI: Periumbilical erythema extending down to her left flank tenderness with light palpation.  Umbilical hernia appears tense with shiny skin small area of darkness at the 12 o'clock position of the the umbilicus, no drainage or bleeding, bowel sounds present, abdomen is soft Musculoskeletal: Normal bulk and tone.  No peripheral edema noted. Neurological: Is alert and oriented x4, no apparent focal deficits noted. Skin: Warm and dry. Diminished skin turgor Psychiatric: Normal mood and affect.   Assessment/Plan:  Active Problems:   Abdominal wall cellulitis  Savannah Hobbs is a 75 y.o. with pertinent PMH of PAF, Hiatal hernia, cirrhosis, and GERD who presented with abdominal pain and admit for treatment of abdominal wall cellulitis on hospital day 1.   #Abdominal Wall Cellulitis #Fat-Containing Periumbilical Hernia #Dehydration Mild elevated temperature of 100.9 F, leukocytosis , lactic acid of 1.8, and confirmed on CT imaging, with no strangulation of the hernia.  Patient with improved leukocytosis from 34->25 which appears to be close to her baseline given history of myeloproliferative neoplasm this morning.  General surgery consulted in ED recommending outpatient surgical follow-up, no indications for surgery.  Poor candidate given obesity and ascites setting of liver cirrhosis.  Continues to have significant abdominal pain and erythema concerning for cellulitis and intra-abdominal infection setting of strangulated umbilical hernia.  Also appears dehydrated on exam. Broaden antibiotic coverage to Zosyn. -Continue vancomycin and start Zosyn per pharmacy -Tylenol 650 mg every 4 as needed for pain, tramadol 50 mg as needed for severe pain -- LR 500 mL bolus per decreased skin turgor - Bowel regimen to prevent abdominal strain - Fever curve and CBCs -- We will need outpatient surgical follow-up after discharge.   #Paroxysmal Atrial Fibrillation S/P Ablation 09/26/2020 #A. Flutter CHA2DS2-VASc score of 2, on Eliquis 5 mg twice daily and flecainide 200 mg as needed daily.  Atrial fibrillation appears to be in the setting of COVID-19 infection 2020, was found to be in atrial flutter in 2021, hospitalization.  She is status post  ablation by Dr. Rayann Heman in July 2022.  She has not had to use her flecainide since her ablation.  EKG currently shows sinus tachycardia today. She is followed by Dr. Rayann Heman and status post ablation in July 2020.  CHA2DS2-VASc of 2 on Eliquis.  Has not needed  flecainide since ablation.  Remains in sinus rhythm tachycardia has resolved. - Continue Eliquis 5 mg twice daily   #Cirrhosis with Portal Hypertension #Thrombocytopenia In the setting of medication use for knee pain.  Confirmed by Dr. Carlean Purl in 2018.  Appears well compensated at this point.  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  Not currently on treatment, monitored by her oncologist.  Prior to Admission Living Arrangement: Home Anticipated Discharge Location: Home Barriers to Discharge: Further medical treatment Dispo: Anticipated discharge in approximately 1-2 day(s).   Savannah Beard, MD 01/19/2021, 2:48 PM Pager: [phone] After 5pm on weekdays and 1pm on weekends: On Call pager [phone]

## 2021-01-19 NOTE — Progress Notes (Signed)
Pharmacy Antibiotic Note  Savannah Hobbs is a 75 y.o. female admitted on 01/17/2021 with strangulated piece of omentum in her hernia, no surgery is planned. She also has abdominal wall cellulitis.  Pharmacy has been consulted for Vancomycin and piperacillin/tazobactam dosing.  Good renal function ClCr 70 ml/min. WBC 25. Afebrile.   Plan:  piperacillin/tazobactam 3.375g Q8 hr EI  Vancomycin 1500mg  IV q24h (eAUC 507, Cr rounded to 0.8mg /dL, Vd 0.5) Monitor cultures, clinical status, renal fx, vanc level Narrow abx as able and f/u duration   Height: 5\' 5"  (165.1 cm) Weight: 90.8 kg (200 lb 2.8 oz) IBW/kg (Calculated) : 57  Temp (24hrs), Avg:99 F (37.2 C), Min:98.1 F (36.7 C), Max:99.9 F (37.7 C)  Recent Labs  Lab 01/17/21 2225 01/18/21 0429 01/19/21 0546  WBC 34.1*  --  25.8*  CREATININE 0.53  --  0.43*  LATICACIDVEN  --  1.8  --      Estimated Creatinine Clearance: 70.7 mL/min (A) (by C-G formula based on SCr of 0.43 mg/dL (L)).    Allergies  Allergen Reactions   Iodine Hives   Shellfish Allergy Hives    Antimicrobials this admission: Vanc 10/29 >>  Ceftriaxone 10/29 >> 10/30 Pip/tazp 10/30 >>   Dose adjustments this admission: N/A  Microbiology results: 10/29 BCx: pending 10/29 MRSA PCR: pending  Thank you for allowing pharmacy to be a part of this patient's care.  Benetta Spar, PharmD, BCPS, BCCP Clinical Pharmacist  Please check AMION for all [place] phone numbers After 10:00 PM, call [place] [phone]

## 2021-01-19 NOTE — Care Management Obs Status (Signed)
[place] NOTIFICATION   Patient Details  Name: Savannah Hobbs MRN: [id number] Date of Birth: 1945/10/31   Medicare Observation Status Notification Given:  Yes  Permission to sign given due to remote notice  Verdell Carmine, RN 01/19/2021, 2:56 PM

## 2021-01-19 NOTE — Evaluation (Signed)
Physical Therapy Evaluation and Discharge Patient Details Name: Savannah Hobbs MRN: [id number] DOB: 05/02/1945 Today's Date: 01/19/2021  History of Present Illness  75 y.o. female who presents to [facility]/18/21 with Abdominal pain and slid to the floor from her bed. +abdominal cellulitis; per surgery "she has a strangulated piece of omentum in her hernia" and is not in need of surgery. PMH of Cirrhosis, GERD, Hiatal hernia, and PAF  Clinical Impression   Patient evaluated by Physical Therapy with no further acute PT needs identified. All education has been completed and the patient has no further questions.  PT is signing off. Thank you for this referral.        Recommendations for follow up therapy are one component of a multi-disciplinary discharge planning process, led by the attending physician.  Recommendations may be updated based on patient status, additional functional criteria and insurance authorization.  Follow Up Recommendations No PT follow up    Assistance Recommended at Discharge PRN  Functional Status Assessment Patient has had a recent decline in their functional status and demonstrates the ability to make significant improvements in function in a reasonable and predictable amount of time.  Equipment Recommendations  None recommended by PT    Recommendations for Other Services       Precautions / Restrictions Precautions Precautions: None      Mobility  Bed Mobility Overal bed mobility: Modified Independent             General bed mobility comments: using rail and HOB slightly elevated; in and out of bed modified independent    Transfers Overall transfer level: Modified independent Equipment used: Rolling walker (2 wheels)                    Ambulation/Gait Ambulation/Gait assistance: Supervision Gait Distance (Feet): 200 Feet Assistive device: Rolling walker (2 wheels) Gait Pattern/deviations: Step-through pattern;Decreased stride  length   Gait velocity interpretation: <1.8 ft/sec, indicate of risk for recurrent falls General Gait Details: pt demonstrates safe, proper use of RW; supervision for safety as pt "just feels weak" however no imbalance noted  Stairs            Wheelchair Mobility    Modified Rankin (Stroke Patients Only)       Balance Overall balance assessment: Modified Independent                                           Pertinent Vitals/Pain Pain Assessment: Faces Faces Pain Scale: Hurts even more Pain Location: abd Pain Descriptors / Indicators: Discomfort;Guarding Pain Intervention(s): Limited activity within patient's tolerance;Repositioned    Home Living Family/patient expects to be discharged to:: Private residence Living Arrangements: Spouse/significant other Available Help at Discharge: Family;Available 24 hours/day Type of Home: House Home Access: Stairs to enter Entrance Stairs-Rails: None Entrance Stairs-Number of Steps: 2 (shallow steps)   Home Layout: Laundry or work area in basement;Able to live on main level with bedroom/bathroom Home Equipment: Civil engineer, contracting;Shower seat - built in;Toilet riser;Rolling Walker (2 wheels);BSC;Wheelchair - manual      Prior Function Prior Level of Function : Independent/Modified Independent             Mobility Comments: had bad fall 11/2020 and had been doing OPPT recently; progressed to not using a device ADLs Comments: independent     Hand Dominance        Extremity/Trunk Assessment  Upper Extremity Assessment Upper Extremity Assessment: Overall WFL for tasks assessed    Lower Extremity Assessment Lower Extremity Assessment: Overall WFL for tasks assessed    Cervical / Trunk Assessment Cervical / Trunk Assessment: Other exceptions Cervical / Trunk Exceptions: sore abdomen due to hernia and cellulitis  Communication   Communication: No difficulties  Cognition Arousal/Alertness:  Awake/alert Behavior During Therapy: WFL for tasks assessed/performed Overall Cognitive Status: Within Functional Limits for tasks assessed                                          General Comments General comments (skin integrity, edema, etc.): Discussed that she will know when to stop using RW and already has one at home    Exercises     Assessment/Plan    PT Assessment Patient does not need any further PT services  PT Problem List         PT Treatment Interventions      PT Goals (Current goals can be found in the Care Plan section)  Acute Rehab PT Goals Patient Stated Goal: keep her strength up while in the hospital PT Goal Formulation: All assessment and education complete, DC therapy    Frequency     Barriers to discharge        Co-evaluation               AM-PAC PT "6 Clicks" Mobility  Outcome Measure Help needed turning from your back to your side while in a flat bed without using bedrails?: None Help needed moving from lying on your back to sitting on the side of a flat bed without using bedrails?: None Help needed moving to and from a bed to a chair (including a wheelchair)?: None Help needed standing up from a chair using your arms (e.g., wheelchair or bedside chair)?: None Help needed to walk in hospital room?: A Little Help needed climbing 3-5 steps with a railing? : A Little 6 Click Score: 22    End of Session   Activity Tolerance: Patient tolerated treatment well Patient left: in bed;with call bell/phone within reach   PT Visit Diagnosis: Difficulty in walking, not elsewhere classified (R26.2)    Time: 2876-8115 PT Time Calculation (min) (ACUTE ONLY): 30 min   Charges:   PT Evaluation $PT Eval Low Complexity: 1 Low           Arby Barrette, PT Acute Rehabilitation Services  Pager [phone] Office [phone]   Savannah Hobbs 01/19/2021, 2:31 PM

## 2021-01-19 NOTE — Progress Notes (Addendum)
Progress Note: General Surgery Service   Chief Complaint/Subjective: Eating breakfast.  Belly button little less painful  Objective: Vital signs in last 24 hours: Temp:  [98.8 F (37.1 C)-99.9 F (37.7 C)] 98.8 F (37.1 C) (10/30 0502) Pulse Rate:  [88-103] 88 (10/30 0502) Resp:  [16-23] 18 (10/30 0502) BP: (96-140)/(44-67) 123/48 (10/30 0502) SpO2:  [91 %-95 %] 92 % (10/30 0502) Weight:  [90.8 kg] 90.8 kg (10/30 0502) Last BM Date: 01/17/21  Intake/Output from previous day: 10/29 0701 - 10/30 0700 In: -  Out: 1400 [Urine:1400] Intake/Output this shift: No intake/output data recorded.  Constitutional: NAD; conversant; no deformities Eyes: Moist conjunctiva; no lid lag; anicteric; PERRL Neck: Trachea midline; no thyromegaly Lungs: Normal respiratory effort; no tactile fremitus CV: RRR; no palpable thrills; no pitting edema GI: Abd Hard firm umbilical hernia which is tender; no palpable hepatosplenomegaly MSK: Normal range of motion of extremities; no clubbing/cyanosis Psychiatric: Appropriate affect; alert and oriented x3 Lymphatic: No palpable cervical or axillary lymphadenopathy  Lab Results: CBC  Recent Labs    01/17/21 2225 01/19/21 0546  WBC 34.1* 25.8*  HGB 12.3 11.0*  HCT 38.1 34.2*  PLT 124* 109*   BMET Recent Labs    01/17/21 2225 01/19/21 0546  NA 134* 135  K 3.6 3.5  CL 101 104  CO2 25 26  GLUCOSE 115* 96  BUN 12 10  CREATININE 0.53 0.43*  CALCIUM 8.6* 8.2*   PT/INR No results for input(s): LABPROT, INR in the last 72 hours. ABG No results for input(s): PHART, HCO3 in the last 72 hours.  Invalid input(s): PCO2, PO2  Anti-infectives: Anti-infectives (From admission, onward)    Start     Dose/Rate Route Frequency Ordered Stop   01/19/21 0630  vancomycin (VANCOREADY) IVPB 1250 mg/250 mL        1,250 mg 166.7 mL/hr over 90 Minutes Intravenous Every 24 hours 01/18/21 1127 01/25/21 0629   01/19/21 0600  cefTRIAXone (ROCEPHIN) 2 g in sodium  chloride 0.9 % 100 mL IVPB        2 g 200 mL/hr over 30 Minutes Intravenous Every 24 hours 01/18/21 1026 01/25/21 0559   01/18/21 0445  vancomycin (VANCOREADY) IVPB 2000 mg/400 mL        2,000 mg 200 mL/hr over 120 Minutes Intravenous  Once 01/18/21 0440 01/18/21 0900   01/18/21 0445  cefTRIAXone (ROCEPHIN) 2 g in sodium chloride 0.9 % 100 mL IVPB        2 g 200 mL/hr over 30 Minutes Intravenous  Once 01/18/21 0440 01/18/21 3267       Medications: Scheduled Meds:  apixaban  5 mg Oral BID   atorvastatin  40 mg Oral QHS   pantoprazole  40 mg Oral Daily   Continuous Infusions:  cefTRIAXone (ROCEPHIN)  IV 2 g (01/19/21 0622)   vancomycin     PRN Meds:.acetaminophen **OR** acetaminophen, polyethylene glycol  Assessment/Plan: Savannah Hobbs is an 75 y.o. female with cirrhosis with portal hypertension, thrombocytopenia and a incarcerated umbilical hernia containing omentum.     It appears she has a strangulated piece of omentum in her hernia.  This is a painful process, but eventually the omentum will die and hopefully plug this hernia making it difficult for any bowel to strangulated within this hernia.  I would not recommend operating on this patient without optimization of her liver disease.  She is at high risk for complication from any abdominal surgery due to her cirrhosis with portal hypertension and thrombocytopenia.  CT scan is impressive for large recannulated umbilical vein and the falciform ligament.  I do not see evidence of infection in her work-up.  The erythema is likely from the inflammatory process of the omentum dying.  Antibiotics may be able to be stopped soon.  The pain of the acute strangulation of the omentum may last a week or two, but eventually the area will just be hard and nonpainful.  Tolerating diet, pain improving this morning  Follow up with surgery outpatient, no plans for surgical intervention, omentum will hopefully plug this and prevent any future  hernia emergencies  Surgery team will sign off, please call with any issues.     LOS: 0 days    Felicie Morn, MD  [facility] Use [email or web address] to contact on call provider

## 2021-01-19 NOTE — Plan of Care (Signed)

## 2021-01-20 DIAGNOSIS — L03311 Cellulitis of abdominal wall: Secondary | ICD-10-CM | POA: Diagnosis not present

## 2021-01-20 LAB — CBC
HCT: 34.6 % — ABNORMAL LOW (ref 36.0–46.0)
Hemoglobin: 11.1 g/dL — ABNORMAL LOW (ref 12.0–15.0)
MCH: 29.2 pg (ref 26.0–34.0)
MCHC: 32.1 g/dL (ref 30.0–36.0)
MCV: 91.1 fL (ref 80.0–100.0)
Platelets: 128 10*3/uL — ABNORMAL LOW (ref 150–400)
RBC: 3.8 MIL/uL — ABNORMAL LOW (ref 3.87–5.11)
RDW: 18.6 % — ABNORMAL HIGH (ref 11.5–15.5)
WBC: 31.9 10*3/uL — ABNORMAL HIGH (ref 4.0–10.5)
nRBC: 0 % (ref 0.0–0.2)

## 2021-01-20 MED ORDER — OXYCODONE HCL 5 MG PO TABS
5.0000 mg | ORAL_TABLET | ORAL | Status: DC | PRN
Start: 2021-01-20 — End: 2021-02-02
  Administered 2021-01-20 – 2021-01-29 (×28): 5 mg via ORAL
  Filled 2021-01-20 (×29): qty 1

## 2021-01-20 MED ORDER — ACETAMINOPHEN 500 MG PO TABS: 1000.0000 mg | ORAL_TABLET | Freq: Three times a day (TID) | ORAL | Status: DC

## 2021-01-20 MED ORDER — ACETAMINOPHEN 650 MG RE SUPP: 650.0000 mg | Freq: Three times a day (TID) | RECTAL | Status: DC

## 2021-01-20 MED ORDER — HYDROCODONE-ACETAMINOPHEN 5-325 MG PO TABS
1.0000 | ORAL_TABLET | ORAL | Status: DC | PRN
Start: 2021-01-20 — End: 2021-01-20

## 2021-01-20 MED ORDER — ACETAMINOPHEN 500 MG PO TABS
1000.0000 mg | ORAL_TABLET | Freq: Three times a day (TID) | ORAL | Status: DC
  Administered 2021-01-20 – 2021-01-31 (×33): 1000 mg via ORAL
  Filled 2021-01-20 (×34): qty 2

## 2021-01-20 NOTE — Hospital Course (Addendum)
Patient Summary  Savannah Hobbs is a 75 y.o. with a history of PAF, hiatal hernia, cirrhosis, and myeloproliferative neoplasm who was admitted for treatment of abdominal wall cellulitis and strangulated umbilical hernia. Hospital course is outlined below.    #Abdominal Wall Cellulitis #Strangulated Periumbilical Hernia #Dehydration Patient presented to the ED on 10/28 with mild elevated temperature of 100.9 F, leukocytosis, lactic acid of 1.8, and strangulated omental hernia confirmed on CT imaging, with no bowel involvement. General surgery consulted in ED recommending outpatient follow-up, no indications for surgery. She was also noted to have nonpurulent abdominal wall cellulitis.  She was originally started on IV Zosyn for intra-abdominal infection.  However her white blood count and abdominal wall erythema continue to worsen. ID was consulted and recommended addition of IV daptomycin.  Repeat CT showed 6.2 cm loculated fluid collection in the left paraumbilical region. Surgery had an extensive discussion with patient and family, who decided not to proceed with surgical intervention due to high risk of mortality.  The abscess was drained by IR which grew pansensitive E. coli.  Patient was clinically stable on the day of discharge.  Her white blood count has normalized to her normal baseline. She was transitioned to a 14-day course of oral Augmentin.  She will follow-up with infectious disease physician in 3 weeks.  #Cirrhosis with Portal Hypertension #Thrombocytopenia Patient appeared volume overloaded on exam, raising the concern of decompensated cirrhosis in the setting of abdominal wall infection. Patient's Lasix was increased to 60mg  and spironolactone 150mg  daily was added.  The dose was reduced to 100 mg spironolactone and 40 mg Lasix due to low blood pressure.  Patient will be discharged with spironolactone.  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  Not currently on treatment, monitored by her oncologist. WBC peaked on 11/4 to 44.6, and began to down trend. On day of discharge, WBC had stabilized to 23.2.   Follow-up  Patient was instructed to follow-up with her PCP in [place] in the next week and has an appointment scheduled with Dr. West Bali from [place] on 12/6 at 10:30 AM.

## 2021-01-20 NOTE — Progress Notes (Addendum)
Subjective:   No acute overnight events. Patient was seen at bedside during rounds today. She notes possible subtle improvement in pain since yesterday. She says that she took tramadol yesterday but that it did not alleviate her pain. There are no other complaints or concerns at this time.   We counseled her on using pain medications when in pain. She is no longer planning on traveling this week. She is agreeable to stay in hospital today if pain is not appropriately controlled today.   Pt is updated on the plan for today, and all questions are addressed.    Objective:  Vital signs in last 24 hours: Vitals:   01/19/21 0502 01/19/21 1125 01/19/21 2015 01/20/21 0458  BP: (!) 123/48 (!) 140/59 138/60 (!) 120/59  Pulse: 88 97 99 88  Resp: 18 18 18 18   Temp: 98.8 F (37.1 C) 98.1 F (36.7 C) 98.6 F (37 C) 98.8 F (37.1 C)  TempSrc: Oral Oral Oral Oral  SpO2: 92% 96% 95% 93%  Weight: 90.8 kg   92.8 kg  Height:       Constitutional: alert, well-appearing, in NAD HENT: normocephalic, atraumatic, mucous membranes moist Eyes: conjunctiva non-erythematous, EOMI Cardiovascular: Normal rate, regular rhythm, systolic murmur best heard at the left upper sternal border Pulmonary/Chest: normal work of breathing on room air, LCTAB GI: Periumbilical erythema extending down to her left flank tenderness with light palpation. Umbilical hernia appears tense with shiny skin small area of darkness at the 12 o'clock position of the the umbilicus, no drainage or bleeding, bowel sounds present, abdomen is soft.  MSK: normal bulk and tone Neurological: A&O x 3 Skin: warm and dry Psych: normal behavior, normal affect   Assessment/Plan:  Active Problems:   Abdominal wall cellulitis  Savannah Hobbs is a 75 y.o. with pertinent PMH of PAF, Hiatal hernia, cirrhosis, and GERD who presented with abdominal pain and admit for treatment of abdominal wall cellulitis on hospital day 1.    #Nonpurulent   Abdominal Wall Cellulitis #Fat-Containing Periumbilical Hernia #Dehydration Vitals stable, remains afebrile x 2 days. WBC downtrending 34 -> 25 yesterday, but 31.9 today (baseline around 25 given myeloproliferative neoplasm). Stable clinical progression. Gen surg signed off, recommending OP surgical f/u with no indications for surgery at this time. Abdominal pain and nonpurulent cellulitis on exam, day 2/5 of Zosyn. D/c Vanc today given MRSA swab negative. Will hold off on IVF at this time as pt appears euvolemic on exam. Potential d/c today or tmrw with augmentin and pain medications, if continues to make clinical improvement with appropriate pain.  - Pain management: Tylenol 1000 bid mg, tramadol 50 mg q6h prn - Bowel regimen to prevent abdominal strain - Trend Fever curve and CBCs -- outpatient surgical follow-up after discharge -- PT recommending no f/u    #Paroxysmal Atrial Fibrillation S/P Ablation 09/26/2020 #A. Flutter CHA2DS2-VASc score of 2, on Eliquis 5 mg twice daily and flecainide 200 mg as needed daily.  Atrial fibrillation appears to be in the setting of COVID-19 infection 2020, was found to be in atrial flutter in 2021, hospitalization. She is status post ablation by Dr. Rayann Heman in July 2022. She has not had to use her flecainide since her ablation. Remains in sinus rhythm tachycardia has resolved. - Continue Eliquis 5 mg twice daily   #Cirrhosis with Portal Hypertension #Thrombocytopenia In the setting of medication use for knee pain. Confirmed by Dr. Carlean Purl in 2018.  Appears well compensated at this point.  Platelets stable at 109  this morning - Trend CBCs   #Hyperlipidemia -Continue Lipitor 40 mg daily   #GERD -Continue Nexium 20 mg daily  #Myeloproliferative Neoplasm Patient states that she has myeloproliferative dysplasia, confirmed on bone biopsy by her oncologist.  Upon review of bone biopsy on 05/31/2020, patient has m MDS/MPN with 1% blast.  Positive for TET 2  mutations.  Not currently on treatment, monitored by her oncologist.   Prior to Admission Living Arrangement: Home Anticipated Discharge Location: Home Barriers to Discharge: Further medical treatment Dispo: Anticipated discharge in approximately 1-2 day(s).    Savannah Manes, MD 01/20/2021, 7:45 AM Pager: [phone] After 5pm on weekdays and 1pm on weekends: On Call pager [phone]

## 2021-01-20 NOTE — Discharge Instructions (Addendum)
Dear Ms. Herard,  It was a pleasure taking care of you. You were admitted for cellulitis from your umbilical hernial.  Please take Augmentin   Follow up with surgery in 2 weeks    Information on my medicine - ELIQUIS (apixaban)  Why was Eliquis prescribed for you? Eliquis was prescribed for you to reduce the risk of a blood clot forming that can cause a stroke if you have a medical condition called atrial fibrillation (a type of irregular heartbeat).  What do You need to know about Eliquis ? Take your Eliquis TWICE DAILY - one tablet in the morning and one tablet in the evening with or without food. If you have difficulty swallowing the tablet whole please discuss with your pharmacist how to take the medication safely.  Take Eliquis exactly as prescribed by your doctor and DO NOT stop taking Eliquis without talking to the doctor who prescribed the medication.  Stopping may increase your risk of developing a stroke.  Refill your prescription before you run out.  After discharge, you should have regular check-up appointments with your healthcare provider that is prescribing your Eliquis.  In the future your dose may need to be changed if your kidney function or weight changes by a significant amount or as you get older.  What do you do if you miss a dose? If you miss a dose, take it as soon as you remember on the same day and resume taking twice daily.  Do not take more than one dose of ELIQUIS at the same time to make up a missed dose.  Important Safety Information A possible side effect of Eliquis is bleeding. You should call your healthcare provider right away if you experience any of the following: Bleeding from an injury or your nose that does not stop. Unusual colored urine (red or dark brown) or unusual colored stools (red or black). Unusual bruising for unknown reasons. A serious fall or if you hit your head (even if there is no bleeding).  Some medicines may interact  with Eliquis and might increase your risk of bleeding or clotting while on Eliquis. To help avoid this, consult your healthcare provider or pharmacist prior to using any new prescription or non-prescription medications, including herbals, vitamins, non-steroidal anti-inflammatory drugs (NSAIDs) and supplements.  This website has more information on Eliquis (apixaban): http://[email or web address].com/eliquis/home    Low-Sodium Nutrition Therapy Eating less sodium can help you if you have high blood pressure, heart failure, or kidney or liver disease. Your body needs a little sodium, but too much sodium can cause your body to hold onto extra water.  This extra water will raise your blood pressure and can cause damage to your heart, kidneys, or liver as they are forced to work harder. Sometimes you can see how the extra fluid affects you because your hands, legs, or belly swell.  You may also hold water around your heart and lungs, which makes it hard to breathe. Even if you take medication for blood pressure or a water pill (diuretic) to remove fluid, it is still important to have less salt in your diet. Check with your primary care provider before drinking alcohol since it may affect the amount of fluid in your body and how your heart, kidneys, or liver work.  Sodium in Food A low-sodium meal plan limits the sodium that you get from food and beverages to 1,500-2,000 milligrams (mg) per day. Salt is the main source of sodium.  Read the nutrition label on the package  to find out how much sodium is in one serving of a food. Select foods with 140 milligrams (mg) of sodium or less per serving. You may be able to eat one or two servings of foods with a little more than 140 milligrams (mg) of sodium if you are closely watching how much sodium you eat in a day. Check the serving size on the label. The amount of sodium listed on the label shows the amount in one serving of the food. So, if you eat more than  one serving, you will get more sodium than the amount listed.  Cutting Back on Sodium Eat more fresh foods. Fresh fruits and vegetables are low in sodium, as well as frozen vegetables and fruits that have no added juices or sauces. Fresh meats are lower in sodium than processed meats, such as bacon, sausage, and hotdogs. Not all processed foods are unhealthy, but some processed foods may have too much sodium. Eat less salt at the table and when cooking.  One of the ingredients in salt is sodium. One teaspoon of table salt has 2,300 milligrams of sodium. Leave the salt out of recipes for pasta, casseroles, and soups. Be a Paramedic. Food packages that say "Salt-free", sodium-free", "very low sodium," and "low sodium" have less than 140 milligrams of sodium per serving. Beware of products identified as "Unsalted," "No Salt Added," "Reduced Sodium," or "Lower Sodium."  These items may still be high in sodium. You should always check the nutrition label. Add flavors to your food without adding sodium. Try lemon juice, lime juice, or vinegar. Dry or fresh herbs add flavor. Buy a sodium-free seasoning blend or make your own at home. You can purchase salt-free or sodium-free condiments like barbeque sauce in stores and online.   Eating in Restaurants Choose foods carefully when you eat outside your home. Restaurant foods can be very high in sodium.  Many restaurants provide nutrition facts on their menus or their websites. If you cannot find that information, ask your server.  Let your server know that you want your food to be cooked without salt and that you would like your salad dressing and sauces to be served on the side.  Foods Recommended Grains Bread and rolls without salted tops Homemade bread made with reduced-sodium baking powder Cold cereals, especially shredded wheat and puffed rice Oats, grits, or cream of wheat Pastas, quinoa, and rice Popcorn, pretzels or crackers without  salt Corn tortillas Protein Foods Fresh meats and fish (check the nutrition labels - make sure they are not packaged in a sodium solution) Canned or packed tuna (no more than 4 ounces at 1 serving) Beans and peas Soybeans and tofu Eggs Nuts or nut butters without salt Dairy Milk or milk powder Plant milks, such as rice and soy Yogurt, including Greek yogurt Small amounts of natural cheese (blocks of cheese) or reduced-sodium cheese can be used in moderation.  (Swiss, ricotta, and fresh mozzarella cheese are lower in sodium than the others) Cream Cheese Low sodium cottage cheese Vegetables Fresh and frozen vegetables without added sauces or salt Homemade soups (without salt) Low-sodium, salt-free or sodium-free canned vegetables and soups Fruit Fresh and canned fruits Dried fruits, such as raisins, cranberries, and prunes Oils Tub or liquid margarine, regular or without salt Canola, corn, peanut, olive, safflower, or sunflower oils Condiments Fresh or dried herbs such as basil, bay leaf, dill, mustard (dry), nutmeg, paprika, parsley, rosemary, sage, or thyme. Low sodium ketchup Vinegar Lemon or lime juice Pepper,  red pepper flakes, and cayenne. Hot sauce contains sodium, but if you use just a drop or two, it will not add up to much. Salt-free or sodium-free seasoning mixes and marinades Simple salad dressings: vinegar and oil  Foods Not Recommended Grains Breads or crackers topped with salt Cereals (hot/cold) with more than 300 mg sodium per serving Biscuits, cornbread, and other "quick" breads prepared with baking soda Pre-packaged bread crumbs Seasoned and packaged rice and pasta mixes Self-rising flours Protein Foods Cured meats: Bacon, ham, sausage, pepperoni and hot dogs Canned meats (chili, vienna sausage, or sardines) Smoked fish and meats Frozen meals that have more than 600 mg of sodium per serving Egg substitute (with added  sodium) Dairy Buttermilk Processed cheese spreads Cottage cheese (1 cup may have over 500 mg of sodium; look for low-sodium.) American or feta cheese Shredded cheese has more sodium than blocks of cheese String cheese Vegetables Canned vegetables (unless they are salt-free, sodium-free or low sodium) Frozen vegetables with seasoning and sauces Sauerkraut and pickled vegetables Canned or dried soups (unless they are salt-free, sodium-free, or low sodium) [place] fries and onion rings Fruit  Dried fruits preserved with additives that have sodium Oils  Salted butter or margarine, all types of olives Condiments Salt, sea salt, kosher salt, onion salt, and garlic salt Seasoning mixes with salt Bouillon cubes Ketchup Barbeque sauce and Worcestershire sauce unless low sodium Soy sauce Salsa, pickles, olives, relish Salad dressings: ranch, blue cheese, [place], and [place].   Tips For Adding Protein Nutrition Therapy  Patients may be advised to increase the protein in their diet but not necessarily the calories as well. However, note that when adding protein to your diet, you will also be adding extra calories. The following suggestions may help add the extra protein while keeping the calories as low as possible.  Your goal: 93-140 grams protein/day.  Tips Add extra egg to one or more meals  Increase the portion of milk to drink and change to skim milk if able  Include [place] yogurt or cottage cheese for snack or part of a meal  Increase portion size of protein entre and decrease portion of starch/bread  Mix protein powder, nut butter, almond/nut milk, non-fat dry milk, or Greek yogurt to shakes and smoothies  Use these ingredients also in baked goods or other recipes Use double the amount of sandwich filling  Add protein foods to all snacks including cheese, nut butters, milk and yogurt Food Tips for Including Protein  Beans Cook and use dried peas, beans, and tofu in soups or add  to casseroles, pastas, and grain dishes that also contain cheese or meat  Mash with cheese and milk  Use tofu to make smoothies  Commercial Protein Supplements Use nutritional supplements or protein powder sold at pharmacies and grocery stores  Use protein powder in milk drinks and desserts, such as pudding  Mix with ice cream, milk, and fruit or other flavorings for a high-protein milkshake  Cottage Cheese or [employer] Mix with or use to stuff fruits and vegetables  Add to casseroles, spaghetti, noodles, or egg dishes such as omelets, scrambled eggs, and souffls  Use gelatin, pudding-type desserts, cheesecake, and pancake or waffle batter  Use to stuff crepes, pasta shells, or manicotti  Puree and use as a substitute for sour cream  Eggs, Egg whites, and Egg Yolks Add chopped, hard-cooked eggs to salads and dressings, vegetables, casseroles, and creamed meats  Beat eggs into mashed potatoes, vegetable purees, and sauces  Add  extra egg whites to quiches, scrambled eggs, custards, puddings, pancake batter, or [place] toast wash/batter  Make a rich custard with egg yolks, double strength milk, and sugar  Add extra hard-cooked yolks to deviled egg filling and sandwich spreads  Hard or Semi-Soft Cheese (Cheddar, Barnabas Lister, [place]) Melt on sandwiches, bread, muffins, tortillas, hamburgers, hot dogs, other meats or fish, vegetables, eggs, or desserts such as stewed figs or pies  Grate and add to soups, sauces, casseroles, vegetable dishes, potatoes, rice noodles, or meatloaf  Serve as a snack with crackers or bagels  Ice cream, Yogurt, and Frozen Yogurt Add to milk drinks such as milkshakes  Add to cereals, fruits, gelatin desserts, and pies  Blend or whip with soft or cooked fruits  Sandwich ice cream or frozen yogurt between enriched cake slices, cookies, or graham crackers  Use seasoned yogurt as a dip for fruits, vegetables, or chips  Use yogurt in place of sour cream in casseroles  Meat and  Fish Add chopped, cooked meat or fish to vegetables, salads, casseroles, soups, sauces, and biscuit dough  Use in omelets, souffls, quiches, and sandwich fillings  Add chicken and [place] to stuffing  Wrap in pie crust or biscuit dough as turnovers  Add to stuffed baked potatoes  Add pureed meat to soups  Milk Use in beverages and in cooking  Use in preparing foods, such as hot cereal, soups, cocoa, or pudding  Add cream sauces to vegetable and other dishes  Use evaporated milk, evaporated skim milk, or sweetened condensed milk instead of milk or water in recipes.  Nonfat Dry Milk Add 1/3 cup of nonfat dry milk powdered milk to each cup of regular milk for "double strength" milk  Add to yogurt and milk drinks, such as pasteurized eggnog and milkshakes  Add to scrambled eggs and mashed potatoes  Use in casseroles, meatloaf, hot cereal, breads, muffins, sauces, cream soups, puddings and custards, and other milk-based desserts  Nuts, Seeds, and Wheat Germ Add to casseroles, breads, muffins, pancakes, cookies, and waffles  Sprinkle on fruit, cereal, ice cream, yogurt, vegetables, salads, and toast as a crunchy topping  Use in place of breadcrumbs  Blend with parsley or spinach, herbs, and cream for a noodle, pasta, or vegetable sauce.  Roll banana in chopped nuts  Peanut Butter Spread on sandwiches, toast, muffins, crackers, waffles, pancakes, and fruit slices  Use as a dip for raw vegetables, such as carrots, cauliflower, and celery  Blend with milk drinks, smoothies, and other beverages  Swirl through soft ice cream or yogurt  Spread on a banana then roll in crushed, dry cereal or chopped nuts   Copyright 2020  [employer]. All rights reserved

## 2021-01-21 DIAGNOSIS — L03311 Cellulitis of abdominal wall: Secondary | ICD-10-CM | POA: Diagnosis not present

## 2021-01-21 DIAGNOSIS — K429 Umbilical hernia without obstruction or gangrene: Secondary | ICD-10-CM

## 2021-01-21 LAB — CBC
HCT: 35 % — ABNORMAL LOW (ref 36.0–46.0)
Hemoglobin: 11.5 g/dL — ABNORMAL LOW (ref 12.0–15.0)
MCH: 29.5 pg (ref 26.0–34.0)
MCHC: 32.9 g/dL (ref 30.0–36.0)
MCV: 89.7 fL (ref 80.0–100.0)
Platelets: 147 10*3/uL — ABNORMAL LOW (ref 150–400)
RBC: 3.9 MIL/uL (ref 3.87–5.11)
RDW: 18.6 % — ABNORMAL HIGH (ref 11.5–15.5)
WBC: 32.2 10*3/uL — ABNORMAL HIGH (ref 4.0–10.5)
nRBC: 0 % (ref 0.0–0.2)

## 2021-01-21 MED ORDER — PIPERACILLIN-TAZOBACTAM 3.375 G IVPB: 3.3750 g | Freq: Three times a day (TID) | INTRAVENOUS | Status: DC

## 2021-01-21 MED ORDER — PIPERACILLIN-TAZOBACTAM 3.375 G IVPB
3.3750 g | Freq: Three times a day (TID) | INTRAVENOUS | Status: DC
  Administered 2021-01-21 – 2021-01-23 (×7): 3.375 g via INTRAVENOUS
  Filled 2021-01-21 (×7): qty 50

## 2021-01-21 NOTE — Progress Notes (Signed)
Subjective:   No acute overnight events.   Patient was seen at bedside during rounds today. Patient reports mild nausea today with dry mouth. She says that she took oxycodone yesterday but still endorsing poor pain control. Discussed with patient that she is able to oxycodone every 4 hours when requested for pain control. Patient verbalized understanding.  Patient concerned about elevated WBC count (32.2 from 31.9). She is agreeable to stay in hospital today to continue IV antibiotics.   Patient reports having a bowel movement this morning.  Pt is updated on the plan for today, and all questions are addressed.   Objective:  Vital signs in last 24 hours: Vitals:   01/20/21 1740 01/20/21 2106 01/21/21 0428 01/21/21 0524  BP: (!) 112/53 132/62 (!) 127/58   Pulse: 82 95 85   Resp: 18 18 16    Temp: 98.2 F (36.8 C) 98.1 F (36.7 C) 98 F (36.7 C)   TempSrc: Oral Oral Oral   SpO2: 95% 95% 94%   Weight:    93.5 kg  Height:       CBC:  WBC: 31.9 -> 32.2  RBC: 3.8 -> 3.9 Hgb: 11.1 -> 11.5  Constitutional: alert, well-appearing, in NAD but appears uncomfortable HENT: normocephalic, atraumatic, mucous membranes moist Eyes: conjunctiva non-erythematous, EOMI Cardiovascular: Normal rate, regular rhythm, systolic murmur best heard at the left upper sternal border Pulmonary/Chest: normal work of breathing on room air, LCTAB GI: Periumbilical erythema extending down to her left flank tenderness with light palpation. Umbilical hernia appears tense with shiny skin small area of darkness at the 12 o'clock position of the the umbilicus, no drainage or bleeding, bowel sounds present, abdomen is soft.  MSK: normal bulk and tone Neurological: A&O x 3 Skin: warm and dry Psych: normal behavior, normal affect   Assessment/Plan:  Active Problems:   Abdominal wall cellulitis  Savannah Hobbs is a 75 y.o. with pertinent PMH of PAF, Hiatal hernia, cirrhosis, and GERD who presented with  abdominal pain and admit for treatment of abdominal wall cellulitis on hospital day 1.    #Nonpurulent  Abdominal Wall Cellulitis complicated/secondary to strangulated hernia #Fat-Containing Periumbilical Hernia #Dehydration Vitals stable, remains afebrile x 2 days. WBC downtrending 34 -> 25 yesterday, but 31.9 today (baseline around 25 given myeloproliferative neoplasm). Stable clinical progression. Gen surg signed off, recommending OP surgical f/u with no indications for surgery at this time. Abdominal pain and nonpurulent cellulitis on exam. Discontinued Vanc given MRSA swab negative. Will hold off on IVF at this time as pt appears euvolemic on exam. Given increase in WBC (patient reports normally around 20 WBC), plan to continue pt on IV abx.  - Pain management: Tylenol 1000 bid mg, tramadol 50 mg q6h prn, Oxy IR 5 mg q4h prn - Bowel regimen to prevent abdominal strain - Trend fever curve and CBCs -- outpatient surgical follow-up after discharge -- PT recommending no f/u  -- Zosyn IVPB 3.375 q8hr IV (day 3 of zosyn)  -- Continue monitor bowel  #Paroxysmal Atrial Fibrillation S/P Ablation 09/26/2020 #A. Flutter CHA2DS2-VASc score of 2, on Eliquis 5 mg twice daily and flecainide 200 mg as needed daily.  Atrial fibrillation appears to be in the setting of COVID-19 infection 2020, was found to be in atrial flutter in 2021, hospitalization. She is status post ablation by Dr. Rayann Heman in July 2022. She has not had to use her flecainide since her ablation. Remains in sinus rhythm tachycardia has resolved. - Continue Eliquis 5 mg twice daily   #  Cirrhosis with Portal Hypertension #Thrombocytopenia In the setting of medication use for knee pain. Confirmed by Dr. Carlean Purl in 2018.  Appears well compensated at this point.  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  Not currently on treatment, monitored by her oncologist.   Prior to Admission Living Arrangement: Home Anticipated Discharge Location: Home Barriers to Discharge: Further medical treatment Dispo: Anticipated discharge in approximately 1-2 day(s).    France Ravens, MD 01/21/2021, 7:42 AM Pager: [phone] After 5pm on weekdays and 1pm on weekends: On Call pager [phone]

## 2021-01-21 NOTE — Progress Notes (Signed)
Mobility Specialist Progress Note:   01/21/21 1054  Mobility  Activity Ambulated in hall  Level of Assistance Standby assist, set-up cues, supervision of patient - no hands on  Assistive Device Front wheel walker  Distance Ambulated (ft) 500 ft  Mobility Ambulated with assistance in hallway  Mobility Response Tolerated well  Mobility performed by Mobility specialist  $Mobility charge 1 Mobility   Pt received in bed willing to participate in mobility. Asymptomatic throughout ambulation. Pt returned to EOB with call bell in reach and all needs met.   [facility] Health and safety inspector Phone [phone]

## 2021-01-22 DIAGNOSIS — L03311 Cellulitis of abdominal wall: Secondary | ICD-10-CM | POA: Diagnosis not present

## 2021-01-22 DIAGNOSIS — K7469 Other cirrhosis of liver: Secondary | ICD-10-CM | POA: Diagnosis not present

## 2021-01-22 DIAGNOSIS — K429 Umbilical hernia without obstruction or gangrene: Secondary | ICD-10-CM | POA: Diagnosis not present

## 2021-01-22 LAB — CBC
HCT: 33.7 % — ABNORMAL LOW (ref 36.0–46.0)
Hemoglobin: 11.1 g/dL — ABNORMAL LOW (ref 12.0–15.0)
MCH: 29.7 pg (ref 26.0–34.0)
MCHC: 32.9 g/dL (ref 30.0–36.0)
MCV: 90.1 fL (ref 80.0–100.0)
Platelets: 142 10*3/uL — ABNORMAL LOW (ref 150–400)
RBC: 3.74 MIL/uL — ABNORMAL LOW (ref 3.87–5.11)
RDW: 18.7 % — ABNORMAL HIGH (ref 11.5–15.5)
WBC: 29.6 10*3/uL — ABNORMAL HIGH (ref 4.0–10.5)
nRBC: 0.1 % (ref 0.0–0.2)

## 2021-01-22 LAB — BASIC METABOLIC PANEL WITH GFR
Anion gap: 4 — ABNORMAL LOW (ref 5–15)
BUN: 6 mg/dL — ABNORMAL LOW (ref 8–23)
CO2: 28 mmol/L (ref 22–32)
Calcium: 7.9 mg/dL — ABNORMAL LOW (ref 8.9–10.3)
Chloride: 103 mmol/L (ref 98–111)
Creatinine, Ser: 0.4 mg/dL — ABNORMAL LOW (ref 0.44–1.00)
GFR, Estimated: >60 mL/min
Glucose, Bld: 104 mg/dL — ABNORMAL HIGH (ref 70–99)
Potassium: 3.4 mmol/L — ABNORMAL LOW (ref 3.5–5.1)
Sodium: 135 mmol/L (ref 135–145)

## 2021-01-22 MED ORDER — FUROSEMIDE 40 MG PO TABS
40.0000 mg | ORAL_TABLET | Freq: Every day | ORAL | Status: DC
  Administered 2021-01-22 – 2021-01-24 (×3): 40 mg via ORAL
  Filled 2021-01-22 (×3): qty 1

## 2021-01-22 MED ORDER — POTASSIUM CHLORIDE 20 MEQ PO PACK
40.0000 meq | PACK | Freq: Once | ORAL | Status: AC
  Administered 2021-01-22: 40 meq via ORAL
  Filled 2021-01-22: qty 2

## 2021-01-22 NOTE — Progress Notes (Signed)
Mobility Specialist Progress Note:   01/22/21 1119  Mobility  Activity Ambulated in hall  Level of Assistance Standby assist, set-up cues, supervision of patient - no hands on  Assistive Device Front wheel walker  Distance Ambulated (ft) 250 ft  Mobility Ambulated with assistance in hallway  Mobility Response Tolerated well  Mobility performed by Mobility specialist  $Mobility charge 1 Mobility   Pt received EOB willing to participate in mobility. Pt stated upon standing she felt "off" but got better with ambulation. Pt returned to EOB with call bell in reach and all needs met.   [facility] Health and safety inspector Phone [phone]

## 2021-01-22 NOTE — Plan of Care (Signed)
  Problem: Education: Goal: Knowledge of General Education information will improve Description Including pain rating scale, medication(s)/side effects and non-pharmacologic comfort measures Outcome: Progressing   Problem: Health Behavior/Discharge Planning: Goal: Ability to manage health-related needs will improve Outcome: Progressing   

## 2021-01-22 NOTE — Progress Notes (Addendum)
Subjective:   No acute overnight events.   Patient seen and assessed at bedside.  Patient endorses nausea but no vomiting.  Patient also endorses shooting abdominal pain whenever she moves to get up out of bed.  Patient states she is eating and drinking okay.  Patient has not had a bowel movement yesterday or today but has taken MiraLAX for it.  Patient states her abdomen looks better than yesterday and the oxycodone is appropriately managing her pain.  Patient would like to complete IV antibiotics today and have PT OT assist patient with rehabilitation for 1 more day prior to discharge.  Pt is updated on the plan for today, and all questions are addressed.  Discussed switching from IV to p.o. antibiotics tomorrow.  Also discussed reordering patient's home Lasix 40 mg daily.  Patient requesting follow-up in IM clinic and referral to surgeon on discharge to follow-up on abdominal hernia.  Patient lives with husband and is able to assist patient when needed. Patient also mentions needing a letter to show she was the hospital today as she was supposed to be on a flight today.   Objective:  Vital signs in last 24 hours: Vitals:   01/20/21 2106 01/21/21 0428 01/21/21 0524 01/21/21 2000  BP: 132/62 (!) 127/58  128/66  Pulse: 95 85  99  Resp: 18 16    Temp: 98.1 F (36.7 C) 98 F (36.7 C)  98.1 F (36.7 C)  TempSrc: Oral Oral  Oral  SpO2: 95% 94%  95%  Weight:   93.5 kg   Height:       CBC:  WBC: 31.9 -> 32.2 -> 29.6 RBC: 3.8 -> 3.9 -> 3.74 Hgb: 11.1 -> 11.5 -> 11.1  Constitutional: alert, well-appearing, in NAD but appears uncomfortable HENT: normocephalic, atraumatic, mucous membranes moist Eyes: conjunctiva non-erythematous, EOMI Cardiovascular: Normal rate, regular rhythm, systolic murmur best heard at the left upper sternal border Pulmonary/Chest: normal work of breathing on room air, LCTAB GI: Periumbilical erythema extending down to her left flank. Umbilical hernia appears  tense with shiny skin small area of darkness at the 12 o'clock position of the the umbilicus, no drainage or bleeding, bowel sounds present, abdomen is soft.  MSK: normal bulk and tone Neurological: A&O x 3 Skin: warm and dry Psych: normal behavior, normal affect   Assessment/Plan:  Active Problems:   Abdominal wall cellulitis   Periumbilical hernia  Savannah Hobbs is a 75 y.o. with pertinent PMH of PAF, Hiatal hernia, cirrhosis, and GERD who presented with abdominal pain and admit for treatment of abdominal wall cellulitis on hospital day 4.    #Nonpurulent  Abdominal Wall Cellulitis complicated/secondary to strangulated hernia #Fat-Containing Periumbilical Hernia #Dehydration Vitals stable, remains afebrile x 2 days. WBC downtrending 32.2->29.6 (baseline around 25 given myeloproliferative neoplasm). Stable clinical progression. Gen surg signed off, recommending OP surgical f/u with no indications for surgery at this time. Abdominal pain and nonpurulent cellulitis on exam. Discontinued Vanc given MRSA swab negative.  - Pain management: Tylenol 1000 bid mg, tramadol 50 mg q6h prn, Oxy IR 5 mg q4h prn - Bowel regimen to prevent abdominal strain - Trend fever curve and CBCs -- outpatient surgical follow-up after discharge -- PT recommending no f/u  -- Zosyn IVPB 3.375 q8hr IV (day 4 of zosyn), likely transition to oral Augmentin tomorrow if she continues to improve clinically  -- Continue monitor bowel  #Paroxysmal Atrial Fibrillation S/P Ablation 09/26/2020 #A. Flutter CHA2DS2-VASc score of 2, on Eliquis 5 mg twice  daily and flecainide 200 mg as needed daily.  Atrial fibrillation appears to be in the setting of COVID-19 infection 2020, was found to be in atrial flutter in 2021, hospitalization. She is status post ablation by Dr. Rayann Heman in July 2022. She has not had to use her flecainide since her ablation. Remains in sinus rhythm tachycardia has resolved. - Continue Eliquis 5 mg  twice daily   #Cirrhosis with Portal Hypertension #Thrombocytopenia In the setting of medication use for knee pain. Confirmed by Dr. Carlean Purl in 2018.  Appears volume overloaded today. Plan to resume her home lasix.  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  Not currently on treatment, monitored by her oncologist.   Prior to Admission Living Arrangement: Home Anticipated Discharge Location: Home Barriers to Discharge: Further medical treatment Dispo: Anticipated discharge in approximately 1-2 day(s).    France Ravens, MD 01/22/2021, 5:27 AM Pager: [phone] After 5pm on weekdays and 1pm on weekends: On Call pager [phone]

## 2021-01-23 DIAGNOSIS — R188 Other ascites: Secondary | ICD-10-CM

## 2021-01-23 DIAGNOSIS — K429 Umbilical hernia without obstruction or gangrene: Secondary | ICD-10-CM | POA: Diagnosis not present

## 2021-01-23 DIAGNOSIS — K460 Unspecified abdominal hernia with obstruction, without gangrene: Secondary | ICD-10-CM

## 2021-01-23 DIAGNOSIS — K7469 Other cirrhosis of liver: Secondary | ICD-10-CM | POA: Diagnosis not present

## 2021-01-23 DIAGNOSIS — D696 Thrombocytopenia, unspecified: Secondary | ICD-10-CM

## 2021-01-23 DIAGNOSIS — K7581 Nonalcoholic steatohepatitis (NASH): Secondary | ICD-10-CM

## 2021-01-23 DIAGNOSIS — D471 Chronic myeloproliferative disease: Secondary | ICD-10-CM

## 2021-01-23 DIAGNOSIS — I480 Paroxysmal atrial fibrillation: Secondary | ICD-10-CM

## 2021-01-23 DIAGNOSIS — K7460 Unspecified cirrhosis of liver: Secondary | ICD-10-CM

## 2021-01-23 DIAGNOSIS — L03311 Cellulitis of abdominal wall: Secondary | ICD-10-CM | POA: Diagnosis not present

## 2021-01-23 LAB — BASIC METABOLIC PANEL
Anion gap: 6 (ref 5–15)
BUN: 5 mg/dL — ABNORMAL LOW (ref 8–23)
CO2: 28 mmol/L (ref 22–32)
Calcium: 8.1 mg/dL — ABNORMAL LOW (ref 8.9–10.3)
Chloride: 101 mmol/L (ref 98–111)
Creatinine, Ser: 0.45 mg/dL (ref 0.44–1.00)
GFR, Estimated: >60 mL/min (ref >60)
Glucose, Bld: 104 mg/dL — ABNORMAL HIGH (ref 70–99)
Potassium: 3.9 mmol/L (ref 3.5–5.1)
Sodium: 135 mmol/L (ref 135–145)

## 2021-01-23 LAB — CBC
HCT: 35.4 % — ABNORMAL LOW (ref 36.0–46.0)
Hemoglobin: 11.4 g/dL — ABNORMAL LOW (ref 12.0–15.0)
MCH: 29 pg (ref 26.0–34.0)
MCHC: 32.2 g/dL (ref 30.0–36.0)
MCV: 90.1 fL (ref 80.0–100.0)
Platelets: 173 10*3/uL (ref 150–400)
RBC: 3.93 MIL/uL (ref 3.87–5.11)
RDW: 18.9 % — ABNORMAL HIGH (ref 11.5–15.5)
WBC: 39 10*3/uL — ABNORMAL HIGH (ref 4.0–10.5)
nRBC: 0.1 % (ref 0.0–0.2)

## 2021-01-23 LAB — CULTURE, BLOOD (SINGLE)
Culture: NO GROWTH
Special Requests: ADEQUATE

## 2021-01-23 MED ORDER — SPIRONOLACTONE 25 MG PO TABS: 50.0000 mg | ORAL_TABLET | Freq: Two times a day (BID) | ORAL | Status: DC

## 2021-01-23 MED ORDER — CLOTRIMAZOLE 1 % EX CREA
TOPICAL_CREAM | Freq: Two times a day (BID) | CUTANEOUS | Status: DC
  Administered 2021-01-24 – 2021-01-31 (×4): 1 via TOPICAL
  Filled 2021-01-23 (×2): qty 15

## 2021-01-23 MED ORDER — SODIUM CHLORIDE 0.9 % IV SOLN
6.0000 mg/kg | Freq: Every day | INTRAVENOUS | Status: DC
  Administered 2021-01-23 – 2021-01-31 (×9): 450 mg via INTRAVENOUS
  Filled 2021-01-23 (×10): qty 9

## 2021-01-23 MED ORDER — SPIRONOLACTONE 25 MG PO TABS
100.0000 mg | ORAL_TABLET | Freq: Every day | ORAL | Status: DC
  Administered 2021-01-23 – 2021-01-24 (×2): 100 mg via ORAL
  Filled 2021-01-23 (×2): qty 4

## 2021-01-23 NOTE — Care Management Important Message (Signed)
Important Message  Patient Details  Name: Savannah Hobbs MRN: [id number] Date of Birth: Feb 04, 1946   Medicare Important Message Given:  Yes     [name] 01/23/2021, 10:17 AM

## 2021-01-23 NOTE — Progress Notes (Signed)
Pharmacy Antibiotic Note  Amaria Mundorf is a 75 y.o. female admitted on 01/17/2021 with strangulated piece of omentum in her hernia, no surgery is planned. She also has abdominal wall cellulitis.  Pharmacy had been consulted for Vancomycin and piperacillin/tazobactam dosing and now is being changed to daptomycin per ID   Good renal function ClCr 70 ml/min. WBC 25> up to 39 Afebrile.   Plan:  Daptomycin 6mg  /kg q24h of (ABW)   Hold atorvastatin for now Check CK on fridays  Stop zosyn   Height: 5\' 5"  (165.1 cm) Weight: 93.8 kg (206 lb 12.8 oz) IBW/kg (Calculated) : 57  Temp (24hrs), Avg:98.6 F (37 C), Min:98.5 F (36.9 C), Max:98.8 F (37.1 C)  Recent Labs  Lab 01/17/21 2225 01/18/21 0429 01/19/21 0546 01/20/21 0833 01/21/21 0338 01/22/21 0332 01/23/21 0403  WBC 34.1*  --  25.8* 31.9* 32.2* 29.6* 39.0*  CREATININE 0.53  --  0.43*  --   --  0.40* 0.45  LATICACIDVEN  --  1.8  --   --   --   --   --      Estimated Creatinine Clearance: 71.9 mL/min (by C-G formula based on SCr of 0.45 mg/dL).    Allergies  Allergen Reactions   Iodine Hives   Shellfish Allergy Hives    Antimicrobials this admission: Vanc 10/29 >>  Ceftriaxone 10/29 >> 10/30 Pip/tazp 10/30 >>   Dose adjustments this admission: N/A  Microbiology results: 10/29 BCx: ngtd 10/29 MRSA PCR: ng   Bonnita Nasuti Pharm.D. CPP, BCPS Clinical Pharmacist [phone] 01/23/2021 3:02 PM

## 2021-01-23 NOTE — Progress Notes (Addendum)
Subjective:   No acute overnight events.   Patient seen and assessed at bedside.  Patient had a bowel movement this morning which provided some abdominal relief.  Patient does state that her abdomen looks worse today due to increased redness around her umbilical hernia.  Patient states her pain has been relatively managed with oxycodone.  Patient endorses continue worried about her elevated white count especially as it has increased to 39 from 29.  Patient states that her leg swelling has mildly reduced.  Pt is updated on the plan for today is to add spironolactone in order to reduce patient's possible ascites.  Also discussed with patient that we would talk with surgery again to discuss whether patient's strangulated omental hernia is progressing appropriately.  Patient verbalized understanding and agreement and all questions are addressed.     Objective:  Vital signs in last 24 hours: Vitals:   01/22/21 0500 01/22/21 1131 01/22/21 2033 01/23/21 0349  BP:  (!) 152/63 (!) 131/58 (!) 146/82  Pulse:  100 95 (!) 108  Resp:   17 17  Temp:  97.9 F (36.6 C) 98.5 F (36.9 C) 98.5 F (36.9 C)  TempSrc:  Oral Oral Oral  SpO2:  95% 95% 93%  Weight: 95.4 kg   93.8 kg  Height:       CBC:  WBC: 31.9 -> 32.2 -> 29.6 -> 39.0 RBC: 3.8 -> 3.9 -> 3.74 -> 3.93 Hgb: 11.1 -> 11.5 -> 11.1 -> 11.4  Constitutional: alert, well-appearing, in NAD but appears uncomfortable HENT: normocephalic, atraumatic, mucous membranes moist Eyes: conjunctiva non-erythematous, EOMI Cardiovascular: Normal rate, regular rhythm, systolic murmur best heard at the left upper sternal border Pulmonary/Chest: normal work of breathing on room air, LCTAB GI: Periumbilical erythema extending down to her left flank. Umbilical hernia appears tense with shiny skin small area of darkness at the 12 o'clock position of the the umbilicus, no drainage or bleeding, bowel sounds present, abdomen is soft.      MSK: normal bulk and  tone Neurological: A&O x 3 Skin: warm and dry Psych: normal behavior, normal affect   Assessment/Plan:  Active Problems:   Abdominal wall cellulitis   Periumbilical hernia  Devanny Palecek is a 75 y.o. with pertinent PMH of PAF, Hiatal hernia, cirrhosis, and GERD who presented with abdominal pain and admit for treatment of abdominal wall cellulitis on hospital day 5.    #Nonpurulent  Abdominal Wall Cellulitis complicated/secondary to strangulated hernia #Fat-Containing Periumbilical Hernia #Dehydration Vitals stable, remains afebrile x 2 days. WBC uptrending 29.6 -> 39 (baseline around 25 given myeloproliferative neoplasm). Uncertain if this is reactive to strangulated hernia or another etiology. Gen surg signed off, recommending OP surgical f/u with no indications for surgery at this time. Abdominal pain and nonpurulent cellulitis on exam. - Pain management: Tylenol 1000 bid mg, tramadol 50 mg q6h prn, Oxy IR 5 mg q4h prn - Bowel regimen to prevent abdominal strain - Trend fever curve and CBCs -- Given lack of improvement, re consulting general surgery  -- PT recommending no f/u  -- Per ID recs, switching from zosyn to daptomycin 6 mg/kg q24h -- Continue monitor bowel  #Paroxysmal Atrial Fibrillation S/P Ablation 09/26/2020 #A. Flutter CHA2DS2-VASc score of 2, on Eliquis 5 mg twice daily and flecainide 200 mg as needed daily.  Atrial fibrillation appears to be in the setting of COVID-19 infection 2020, was found to be in atrial flutter in 2021, hospitalization. She is status post ablation by Dr. Rayann Heman in July  2022. She has not had to use her flecainide since her ablation. Remains in sinus rhythm tachycardia has resolved. - Continue Eliquis 5 mg twice daily   #Cirrhosis with Portal Hypertension #Thrombocytopenia In the setting of medication use for knee pain. Confirmed by Dr. Carlean Purl in 2018.  Appears volume overloaded today. Plan to resume her home lasix.  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  Not currently on treatment, monitored by her oncologist.   Prior to Admission Living Arrangement: Home Anticipated Discharge Location: Home Barriers to Discharge: Further medical treatment Dispo: Anticipated discharge in approximately 1-2 day(s).    France Ravens, MD 01/23/2021, 7:04 AM Pager: [phone] After 5pm on weekdays and 1pm on weekends: On Call pager [phone]

## 2021-01-23 NOTE — Consult Note (Signed)
[facility]    Date of Admission:  01/17/2021         Reason for Consult: Abdominal wall cellulitis secondary to strangulated hernia    Referring Provider: Velna Ochs, MD Primary Care Provider: Moshe Cipro, MD  Antibiotics likely not to resolve abdominal wall cellulitis completely with underlying strangulated hernia.  Patient's  thrombocytopenia improving.  Agree with asking surgery to reevaluate and discuss risk/benefits given patient is not improving.  Assessment: abdominal wall cellulitis secondary to strangulated hernia 2.  Cirrhosis with portal hypertension  Plan: Stop Vancomycin and Zosyn Daptomycin per pharmacy  Active Problems:   Abdominal wall cellulitis   Periumbilical hernia   Other ascites    acetaminophen  1,000 mg Oral TID   apixaban  5 mg Oral BID   clotrimazole   Topical BID   furosemide  40 mg Oral Daily   pantoprazole  40 mg Oral Daily   polyethylene glycol  17 g Oral Daily   senna  2 tablet Oral Daily   spironolactone  100 mg Oral Daily    HPI: Savannah Hobbs is a 75 y.o. female living with nonalcoholic cirrhosis, myelo dysplastic neoplasm, paroxysmal A. fib anticoagulated, history of tubal ligation and umbilical hernia who we are seeing in consult for abdominal wall cellulitis secondary to strangulated hernia.  Surgery consulted on 01/18/2021 and given high risk patient due to thrombocytopenia and  portal hypertension from cirrhosis did not offer surgery.  Patient on Vanco and Zosyn for abdominal wall cellulitis at this time.  Patient reports increase redness today. She is having bowel movements.    Review of Systems: Per HPI   Past Medical History:  Diagnosis Date   Allergy    Arthritis    Asthma    Cirrhosis of liver ([place])    due to "medications for arthritis".    Elevated cholesterol    Erosive esophagitis 1995   Fatty liver    GERD (gastroesophageal reflux disease)    Hiatal hernia     LARGE   Hx of colonic polyps 05/27/2018   Hx of transfusion of platelets    Paroxysmal atrial fibrillation ([facility])    Thrombocytopenia ([facility])    Typical atrial flutter ([facility])    UTI (lower urinary tract infection)     Social History   Tobacco Use   Smoking status: Never   Smokeless tobacco: Never  Substance Use Topics   Alcohol use: Yes    Alcohol/week: 1.0 standard drink    Types: 1 Glasses of wine per week   Drug use: No    Family History  Problem Relation Age of Onset   Esophageal cancer Father    Hypertension Mother    Colon polyps Neg Hx    Colon cancer Neg Hx    Rectal cancer Neg Hx    Stomach cancer Neg Hx    Allergies  Allergen Reactions   Iodine Hives   Shellfish Allergy Hives    OBJECTIVE: Blood pressure (!) 154/84, pulse 99, temperature 98.8 F (37.1 C), temperature source Oral, resp. rate 18, height 5\' 5"  (1.651 m), weight 93.8 kg, SpO2 93 %.   General: NAD, nl appearance HE: Normocephalic, atraumatic , EOMI, Conjunctivae normal ENT: No congestion, no rhinorrhea, no exudate or erythema  Cardiovascular: Normal rate, regular rhythm.  3/6 systolic murmur heard best left sternal border Pulmonary : Effort normal, breath sounds normal. No wheezes, rales, or rhonchi Abdominal: Tender, umbilical hernia present with  surrounding erythema extending over left lower quadrant and partially in the right lower quadrant Musculoskeletal: No gross deformity , 1+ LEE, normal bulk, and tone Skin: Warm, dry , no bruising, erythema, or rash Psychiatric/Behavioral:  normal mood, normal behavior    Lab Results Lab Results  Component Value Date   WBC 39.0 (H) 01/23/2021   HGB 11.4 (L) 01/23/2021   HCT 35.4 (L) 01/23/2021   MCV 90.1 01/23/2021   PLT 173 01/23/2021    Lab Results  Component Value Date   CREATININE 0.45 01/23/2021   BUN 5 (L) 01/23/2021   NA 135 01/23/2021   K 3.9 01/23/2021   CL 101 01/23/2021   CO2 28 01/23/2021    Lab Results  Component Value Date    ALT 15 01/19/2021   AST 27 01/19/2021   ALKPHOS 66 01/19/2021   BILITOT 1.7 (H) 01/19/2021     Microbiology: Recent Results (from the past 240 hour(s))  Resp Panel by RT-PCR (Flu A&B, Covid) Nasopharyngeal Swab     Status: None   Collection Time: 01/18/21  4:40 AM   Specimen: Nasopharyngeal Swab; Nasopharyngeal(NP) swabs in vial transport medium  Result Value Ref Range Status   SARS Coronavirus 2 by RT PCR NEGATIVE NEGATIVE Final    Comment: (NOTE) SARS-CoV-2 target nucleic acids are NOT DETECTED.  The SARS-CoV-2 RNA is generally detectable in upper respiratory specimens during the acute phase of infection. The lowest concentration of SARS-CoV-2 viral copies this assay can detect is 138 copies/mL. A negative result does not preclude SARS-Cov-2 infection and should not be used as the sole basis for treatment or other patient management decisions. A negative result may occur with  improper specimen collection/handling, submission of specimen other than nasopharyngeal swab, presence of viral mutation(s) within the areas targeted by this assay, and inadequate number of viral copies(<138 copies/mL). A negative result must be combined with clinical observations, patient history, and epidemiological information. The expected result is Negative.  Fact Sheet for Patients:  EntrepreneurPulse.com.au  Fact Sheet for Healthcare Providers:  IncredibleEmployment.be  This test is no t yet approved or cleared by the [place] FDA and  has been authorized for detection and/or diagnosis of SARS-CoV-2 by FDA under an Emergency Use Authorization (EUA). This EUA will remain  in effect (meaning this test can be used) for the duration of the COVID-19 declaration under Section 564(b)(1) of the Act, 21 U.S.C.section 360bbb-3(b)(1), unless the authorization is terminated  or revoked sooner.       Influenza A by PCR NEGATIVE NEGATIVE Final   Influenza B by  PCR NEGATIVE NEGATIVE Final    Comment: (NOTE) The Xpert Xpress SARS-CoV-2/FLU/RSV plus assay is intended as an aid in the diagnosis of influenza from Nasopharyngeal swab specimens and should not be used as a sole basis for treatment. Nasal washings and aspirates are unacceptable for Xpert Xpress SARS-CoV-2/FLU/RSV testing.  Fact Sheet for Patients: EntrepreneurPulse.com.au  Fact Sheet for Healthcare Providers: IncredibleEmployment.be  This test is not yet approved or cleared by the [place] FDA and has been authorized for detection and/or diagnosis of SARS-CoV-2 by FDA under an Emergency Use Authorization (EUA). This EUA will remain in effect (meaning this test can be used) for the duration of the COVID-19 declaration under Section 564(b)(1) of the Act, 21 U.S.C. section 360bbb-3(b)(1), unless the authorization is terminated or revoked.  Performed at [facility], [place] [address] [place], [place] [address]   Culture, blood (single)     Status: None  Collection Time: 01/18/21  4:40 AM   Specimen: BLOOD LEFT HAND  Result Value Ref Range Status   Specimen Description BLOOD LEFT HAND  Final   Special Requests   Final    BOTTLES DRAWN AEROBIC AND ANAEROBIC Blood Culture adequate volume   Culture   Final    NO GROWTH 5 DAYS Performed at [facility], [address] [place], [place] [address]    Report Status 01/23/2021 FINAL  Final  MRSA Next Gen by PCR, Nasal     Status: None   Collection Time: 01/18/21 10:25 AM   Specimen: Nasal Mucosa; Nasal Swab  Result Value Ref Range Status   MRSA by PCR Next Gen NOT DETECTED NOT DETECTED Final    Comment: (NOTE) The GeneXpert MRSA Assay (FDA approved for NASAL specimens only), is one component of a comprehensive MRSA colonization surveillance program. It is not intended to diagnose MRSA infection nor to guide or monitor treatment for MRSA infections. Test performance is not FDA  approved in patients less than 90 years old. Performed at [facility], [place] [address] [place], [place] [address]    Tamsen Snider, MD PGY3 Internal Medicine [phone]

## 2021-01-23 NOTE — Progress Notes (Addendum)
Students and Armed forces logistics/support/administrative officer rounding with the [employer]MT[employer]/B[employer]. We were asked to address the addition of spironolactone to her current regimen of furosemide, 40 mg PO once daily. Dosing spironolactone with furosemide is done in a ratio of 40:100, i.e., since the patient is on 40mg  of furosemide PO once daily, the appropriate dose of spironolactone is 100mg . Suggest commencing spironolactone 100mg  PO once daily, given with furosemide, once daily to maximize adherence upon discharge. Proper monitoring for spironolactone (as well as furosemide) includes evaluating the patient's serum potassium level and accessing the patient's serum creatinine at three days, one week, and then at least monthly after initiation of spironolactone. The anticipated benefit of adding spironolactone may be observed in 2-3 days.  Jamie Brookes and Azucena Cecil, P4 Pharmacy Students CPHS  I have reviewed and discussed this note with the students.   Jorene Guest, PharmD, CPP

## 2021-01-23 NOTE — Progress Notes (Signed)
Mobility Specialist Progress Note    01/23/21 1202  Mobility  Activity Ambulated in hall  Level of Assistance Standby assist, set-up cues, supervision of patient - no hands on  Assistive Device Front wheel walker  Distance Ambulated (ft) 430 ft  Mobility Ambulated with assistance in hallway  Mobility Response Tolerated well  Mobility performed by Mobility specialist  $Mobility charge 1 Mobility   Pt received coming out of BR and agreeable. No complaints on walk. Feeling stronger than when she was admitted but not back to full strength. Returned to room with NT present.   Hildred Alamin Mobility Specialist  Mobility Specialist Phone: [phone]

## 2021-01-23 NOTE — Consult Note (Signed)
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 We can add back anti-MRSA coverage with daptomycin along with continuing Zosyn for GI flora given the omental involvement and ascitic fluid potentially involved with her incarcerated hernia. I am highly skeptical that antibiotics will make a difference in her outcome but I am willing to help with them     Active Problems:   Abdominal wall cellulitis   Periumbilical hernia   Other ascites   Scheduled Meds:  acetaminophen  1,000 mg Oral TID   apixaban  5 mg Oral BID   clotrimazole   Topical BID   furosemide  40 mg Oral Daily   pantoprazole  40 mg Oral Daily   polyethylene glycol  17 g Oral Daily   senna  2 tablet Oral Daily   spironolactone  100 mg Oral Daily   Continuous Infusions:  DAPTOmycin (CUBICIN)  IV 450 mg (01/23/21 1659)   PRN Meds:.oxyCODONE, traMADol  HPI: Savannah Hobbs is a 75 y.o. female with past medical history significant for myelodysplastic syndrome, NASH with cirrhosis of the liver-with cirrhosis thrombocytopenia see esophageal varices, paroxysmal atrial fibrillation who had prior tubal ligation and incisional hernia.  Within the last week she developed severe pain with erythema and distention of her abdomen and brought her to the emergency department.  In the ER CT scan showed a large umbilical hernia with congested fat and ascitic fluid.   She was seen by general surgery who felt that she was too high and operative risk given her cirrhosis thrombocytopenia esophageal varices myelodysplastic syndrome.  Instead she has been managed with antibiotics and conservative  treatment.  In the last 24 hours she has had worsening erythema and pain at her abdominal wall site.  She was on vancomycin and then Zosyn alone recently.  There is no purulence on exam today but marked distention and erythema of the abdominal wall.  General surgery are going to come back to see the patient.  I spent 84 minutes with the patient including than 50% of the time in face to face counseling of the patient re her incarcerated hernia with cirrhosis, myeloproliferative disoder, ttpenia along with personally reviewing CT abdomen and pelvis from this admission  along with review of medical records in preparation for the visit and during the visit and in coordination of her care.   Review of Systems: Review of Systems  Constitutional:  Positive for malaise/fatigue. Negative for chills, fever and weight loss.  HENT:  Negative for congestion and sore throat.   Eyes:  Negative for blurred vision and photophobia.  Respiratory:  Negative for cough, shortness of breath and wheezing.   Cardiovascular:  Negative for chest pain, palpitations and leg swelling.  Gastrointestinal:  Positive for abdominal pain and nausea. Negative for blood in stool, constipation, diarrhea, heartburn, melena and vomiting.  Genitourinary:  Negative for dysuria, flank pain and hematuria.  Musculoskeletal:  Negative for back pain, falls, joint pain and myalgias.  Skin:  Negative for itching and rash.  Neurological:  Negative for dizziness, focal weakness, loss of consciousness, weakness and headaches.  Endo/Heme/Allergies:  Does not bruise/bleed easily.  Psychiatric/Behavioral:  Negative for depression and suicidal ideas. The patient does not have insomnia.    Past Medical History:  Diagnosis Date   Allergy    Arthritis    Asthma    Cirrhosis of liver ([place])    due to "medications for arthritis".    Elevated cholesterol    Erosive esophagitis 1995   Fatty liver    GERD (gastroesophageal reflux disease)     Hiatal hernia    LARGE   Hx of colonic polyps 05/27/2018   Hx of transfusion of platelets    Paroxysmal atrial fibrillation ([facility])    Thrombocytopenia ([facility])    Typical atrial flutter ([facility])    UTI (lower urinary tract infection)     Social History   Tobacco Use   Smoking status: Never   Smokeless tobacco: Never  Substance Use Topics   Alcohol use: Yes    Alcohol/week: 1.0 standard drink    Types: 1 Glasses of wine per week   Drug use: No    Family History  Problem Relation Age of Onset   Esophageal cancer Father    Hypertension Mother    Colon polyps Neg Hx    Colon cancer Neg Hx    Rectal cancer Neg Hx    Stomach cancer Neg Hx    Allergies  Allergen Reactions   Iodine Hives   Shellfish Allergy Hives    OBJECTIVE: Blood pressure (!) 154/84, pulse 99, temperature 98.8 F (37.1 C), temperature source Oral, resp. rate 18, height 5\' 5"  (1.651 m), weight 93.8 kg, SpO2 93 %.  Physical Exam Constitutional:      General: She is not in acute distress.    Appearance: Normal appearance. She is well-developed. She is not ill-appearing or diaphoretic.  HENT:     Head: Normocephalic and atraumatic.     Right Ear: Hearing and external ear normal.     Left Ear: Hearing and external ear normal.     Nose: No nasal deformity or rhinorrhea.  Eyes:     General: No scleral icterus.    Conjunctiva/sclera: Conjunctivae normal.     Right eye: Right conjunctiva is not injected.     Left eye: Left conjunctiva is not injected.     Pupils: Pupils are equal, round, and reactive to light.  Neck:     Vascular: No JVD.  Cardiovascular:     Rate and Rhythm: Normal rate. Rhythm irregular.     Heart sounds: Normal heart sounds, S1 normal and S2 normal. No murmur heard.   No friction rub. No gallop.  Pulmonary:     Effort: Pulmonary effort is normal. No respiratory distress.     Breath sounds: No stridor. No wheezing or rhonchi.  Abdominal:     General: There is distension.     Palpations:  There is splenomegaly.     Tenderness: There is abdominal tenderness.  Musculoskeletal:        General: Normal range of motion.     Right shoulder: Normal.     Left shoulder: Normal.     Cervical back: Normal range of motion and neck supple.     Right hip: Normal.     Left hip: Normal.     Right knee: Normal.     Left knee: Normal.  Lymphadenopathy:     Head:     Right side of head: No submandibular, preauricular or posterior auricular adenopathy.     Left side of head: No submandibular, preauricular or posterior auricular adenopathy.  Cervical: No cervical adenopathy.     Right cervical: No superficial or deep cervical adenopathy.    Left cervical: No superficial or deep cervical adenopathy.  Skin:    General: Skin is warm and dry.     Coloration: Skin is not pale.     Findings: No abrasion, bruising, ecchymosis, erythema, lesion or rash.     Nails: There is no clubbing.  Neurological:     Mental Status: She is alert and oriented to person, place, and time.     Sensory: No sensory deficit.     Coordination: Coordination normal.     Gait: Gait normal.  Psychiatric:        Attention and Perception: She is attentive.        Mood and Affect: Mood normal.        Speech: Speech normal.        Behavior: Behavior normal. Behavior is cooperative.        Thought Content: Thought content normal.        Judgment: Judgment normal.   Area of hernia 01/23/2021:     Lab Results Lab Results  Component Value Date   WBC 39.0 (H) 01/23/2021   HGB 11.4 (L) 01/23/2021   HCT 35.4 (L) 01/23/2021   MCV 90.1 01/23/2021   PLT 173 01/23/2021    Lab Results  Component Value Date   CREATININE 0.45 01/23/2021   BUN 5 (L) 01/23/2021   NA 135 01/23/2021   K 3.9 01/23/2021   CL 101 01/23/2021   CO2 28 01/23/2021    Lab Results  Component Value Date   ALT 15 01/19/2021   AST 27 01/19/2021   ALKPHOS 66 01/19/2021   BILITOT 1.7 (H) 01/19/2021     Microbiology: Recent Results (from  the past 240 hour(s))  Resp Panel by RT-PCR (Flu A&B, Covid) Nasopharyngeal Swab     Status: None   Collection Time: 01/18/21  4:40 AM   Specimen: Nasopharyngeal Swab; Nasopharyngeal(NP) swabs in vial transport medium  Result Value Ref Range Status   SARS Coronavirus 2 by RT PCR NEGATIVE NEGATIVE Final    Comment: (NOTE) SARS-CoV-2 target nucleic acids are NOT DETECTED.  The SARS-CoV-2 RNA is generally detectable in upper respiratory specimens during the acute phase of infection. The lowest concentration of SARS-CoV-2 viral copies this assay can detect is 138 copies/mL. A negative result does not preclude SARS-Cov-2 infection and should not be used as the sole basis for treatment or other patient management decisions. A negative result may occur with  improper specimen collection/handling, submission of specimen other than nasopharyngeal swab, presence of viral mutation(s) within the areas targeted by this assay, and inadequate number of viral copies(<138 copies/mL). A negative result must be combined with clinical observations, patient history, and epidemiological information. The expected result is Negative.  Fact Sheet for Patients:  EntrepreneurPulse.com.au  Fact Sheet for Healthcare Providers:  IncredibleEmployment.be  This test is no t yet approved or cleared by the [place] FDA and  has been authorized for detection and/or diagnosis of SARS-CoV-2 by FDA under an Emergency Use Authorization (EUA). This EUA will remain  in effect (meaning this test can be used) for the duration of the COVID-19 declaration under Section 564(b)(1) of the Act, 21 U.S.C.section 360bbb-3(b)(1), unless the authorization is terminated  or revoked sooner.       Influenza A by PCR NEGATIVE NEGATIVE Final   Influenza B by PCR NEGATIVE NEGATIVE Final    Comment: (NOTE) The Xpert Xpress  SARS-CoV-2/FLU/RSV plus assay is intended as an aid in the diagnosis of  influenza from Nasopharyngeal swab specimens and should not be used as a sole basis for treatment. Nasal washings and aspirates are unacceptable for Xpert Xpress SARS-CoV-2/FLU/RSV testing.  Fact Sheet for Patients: EntrepreneurPulse.com.au  Fact Sheet for Healthcare Providers: IncredibleEmployment.be  This test is not yet approved or cleared by the [place] FDA and has been authorized for detection and/or diagnosis of SARS-CoV-2 by FDA under an Emergency Use Authorization (EUA). This EUA will remain in effect (meaning this test can be used) for the duration of the COVID-19 declaration under Section 564(b)(1) of the Act, 21 U.S.C. section 360bbb-3(b)(1), unless the authorization is terminated or revoked.  Performed at [facility], [place] [address] [place], [place] [address]   Culture, blood (single)     Status: None   Collection Time: 01/18/21  4:40 AM   Specimen: BLOOD LEFT HAND  Result Value Ref Range Status   Specimen Description BLOOD LEFT HAND  Final   Special Requests   Final    BOTTLES DRAWN AEROBIC AND ANAEROBIC Blood Culture adequate volume   Culture   Final    NO GROWTH 5 DAYS Performed at [facility], [place] [address] [place], [place] [address]    Report Status 01/23/2021 FINAL  Final  MRSA Next Gen by PCR, Nasal     Status: None   Collection Time: 01/18/21 10:25 AM   Specimen: Nasal Mucosa; Nasal Swab  Result Value Ref Range Status   MRSA by PCR Next Gen NOT DETECTED NOT DETECTED Final    Comment: (NOTE) The GeneXpert MRSA Assay (FDA approved for NASAL specimens only), is one component of a comprehensive MRSA colonization surveillance program. It is not intended to diagnose MRSA infection nor to guide or monitor treatment for MRSA infections. Test performance is not FDA approved in patients less than 79 years old. Performed at [facility], [place] [address] [place], [place] [address]     Alcide Evener, [place] [facility] [phone] pager  01/23/2021, 5:52 PM

## 2021-01-24 ENCOUNTER — Inpatient Hospital Stay (HOSPITAL_COMMUNITY): Payer: Medicare Other

## 2021-01-24 DIAGNOSIS — L03311 Cellulitis of abdominal wall: Secondary | ICD-10-CM | POA: Diagnosis not present

## 2021-01-24 DIAGNOSIS — K7581 Nonalcoholic steatohepatitis (NASH): Secondary | ICD-10-CM

## 2021-01-24 DIAGNOSIS — D471 Chronic myeloproliferative disease: Secondary | ICD-10-CM

## 2021-01-24 DIAGNOSIS — D696 Thrombocytopenia, unspecified: Secondary | ICD-10-CM

## 2021-01-24 DIAGNOSIS — R1033 Periumbilical pain: Secondary | ICD-10-CM | POA: Diagnosis not present

## 2021-01-24 DIAGNOSIS — K429 Umbilical hernia without obstruction or gangrene: Secondary | ICD-10-CM | POA: Diagnosis not present

## 2021-01-24 DIAGNOSIS — R188 Other ascites: Secondary | ICD-10-CM | POA: Diagnosis not present

## 2021-01-24 LAB — CBC WITH DIFFERENTIAL/PLATELET
Abs Immature Granulocytes: 0 10*3/uL (ref 0.00–0.07)
Basophils Absolute: 0 10*3/uL (ref 0.0–0.1)
Basophils Relative: 0 %
Eosinophils Absolute: 0.9 10*3/uL — ABNORMAL HIGH (ref 0.0–0.5)
Eosinophils Relative: 2 %
HCT: 36.7 % (ref 36.0–46.0)
Hemoglobin: 11.7 g/dL — ABNORMAL LOW (ref 12.0–15.0)
Lymphocytes Relative: 4 %
Lymphs Abs: 1.8 10*3/uL (ref 0.7–4.0)
MCH: 29 pg (ref 26.0–34.0)
MCHC: 31.9 g/dL (ref 30.0–36.0)
MCV: 91.1 fL (ref 80.0–100.0)
Monocytes Absolute: 1.3 10*3/uL — ABNORMAL HIGH (ref 0.1–1.0)
Monocytes Relative: 3 %
Neutro Abs: 40.6 10*3/uL — ABNORMAL HIGH (ref 1.7–7.7)
Neutrophils Relative %: 91 %
Platelets: 214 10*3/uL (ref 150–400)
RBC: 4.03 MIL/uL (ref 3.87–5.11)
RDW: 19.1 % — ABNORMAL HIGH (ref 11.5–15.5)
WBC: 44.6 10*3/uL — ABNORMAL HIGH (ref 4.0–10.5)
nRBC: 0 /100 WBC
nRBC: 0.1 % (ref 0.0–0.2)

## 2021-01-24 LAB — BASIC METABOLIC PANEL
Anion gap: 4 — ABNORMAL LOW (ref 5–15)
BUN: 5 mg/dL — ABNORMAL LOW (ref 8–23)
CO2: 28 mmol/L (ref 22–32)
Calcium: 8.5 mg/dL — ABNORMAL LOW (ref 8.9–10.3)
Chloride: 102 mmol/L (ref 98–111)
Creatinine, Ser: 0.45 mg/dL (ref 0.44–1.00)
GFR, Estimated: >60 mL/min (ref >60)
Glucose, Bld: 96 mg/dL (ref 70–99)
Potassium: 3.9 mmol/L (ref 3.5–5.1)
Sodium: 134 mmol/L — ABNORMAL LOW (ref 135–145)

## 2021-01-24 LAB — CK
Total CK: 33 U/L — ABNORMAL LOW (ref 38–234)
Total CK: 39 U/L (ref 38–234)

## 2021-01-24 MED ORDER — IOHEXOL 9 MG/ML PO SOLN
ORAL | Status: AC
  Administered 2021-01-24: 500 mL
  Filled 2021-01-24: qty 1000

## 2021-01-24 MED ORDER — PIPERACILLIN-TAZOBACTAM 3.375 G IVPB
3.3750 g | Freq: Three times a day (TID) | INTRAVENOUS | Status: DC
  Administered 2021-01-24 – 2021-02-01 (×24): 3.375 g via INTRAVENOUS
  Filled 2021-01-24 (×24): qty 50

## 2021-01-24 MED ORDER — SPIRONOLACTONE 25 MG PO TABS
150.0000 mg | ORAL_TABLET | Freq: Every day | ORAL | Status: DC
  Administered 2021-01-25 – 2021-02-01 (×8): 150 mg via ORAL
  Filled 2021-01-24 (×8): qty 6

## 2021-01-24 MED ORDER — IOHEXOL 350 MG/ML SOLN
100.0000 mL | Freq: Once | INTRAVENOUS | Status: AC | PRN
  Administered 2021-01-24: 100 mL via INTRAVENOUS

## 2021-01-24 MED ORDER — FUROSEMIDE 40 MG PO TABS
60.0000 mg | ORAL_TABLET | Freq: Every day | ORAL | Status: DC
  Administered 2021-01-25 – 2021-02-01 (×8): 60 mg via ORAL
  Filled 2021-01-24 (×8): qty 1

## 2021-01-24 NOTE — Progress Notes (Signed)
42 asked to reconsult on patient.  Was seen over the weekend by on-call surgeon.  Erythema noted over the weekend and documented in his note.  CT reviewed which showed erythema as well.  Erythema has persisted and primary service has reconsulted surgery.  Upon examination there is an area of erythema noted around the hernia which is not new.  Hernia is still incarcerated as it was this weekend.  White count though has increased somewhat.  Recommend repeating CT scanning to further evaluate for progression, abscess or other abdominal wall problem.  May require surgical intervention.  Recommended making patient n.p.o. after midnight and holding Eliquis.  Patient realizes she is in significantly elevated operative risk for morbidity mortality due to her underlying cirrhosis.  Unfortunately, this process continues and she develops more inflammation/infection, this as well could increase her morbidity mortality.  Currently, she is hemodynamically stable and from the best of my ability her exam appears to be the same as was documented on 01/17/2021.

## 2021-01-24 NOTE — Consult Note (Signed)
Referring Provider:   Dr. Philipp Ovens        Primary Care Physician:  Moshe Cipro, MD Primary Gastroenterologist:   Dr. 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  WBC has increased to 44.6 today.  CT scan shows a 6.2 cm loculated fluid collection in the left periumbilical region with no significant interval change.  ID and surgery are following to help with management of her incarcerated umbilical hernia.  Due to her cirrhosis, she is at high risk for postoperative mortality. She is Childs-Pugh Class B, and her MELD score is 17. I have calculated out her post-operative mortality, which is as follows based upon two different surgical calculators.   Predicted Postoperative Outcomes by the VOCAL-Penn Score:     30-day mortality: 12.9%     90-day mortality: 28.7%     180-day mortality: 32.3%     90-day decompensation: 27.7%  [facility] Cirrhosis Post-operative Mortality Risk: 7-day mortality: 10.235% 30-day mortality: 35.793% 90-day mortality: 51.109% 1 year mortality: 65.161% 5 year mortality: 94.605%  - Ideally patient should be on low sodium <2 g/day, 1-1.5 g/kg/day protein intake diet - Nutrition consult - Continue Lasix and spironolactone   HPI:     Savannah Hobbs is a 75 y.o. female with history of cirrhosis (suspected to be due to NASH vs DILI), MDS, A-fib, obesity, periumbilical hernia presents with strangulated omentum-containing umbilical hernia and cellulitis.  We are consulted to determine her postoperative risk of mortality.  Patient presented about a week ago with abdominal pain, nausea, stomach rash, and fevers.  She has been hospitalized, she feels like the rash has potentially  gotten better on the right side.  However, her husband who was at bedside states that she has not seen much improvement in the rashes appearance.  Patient states that prior to this presentation, her cirrhosis was well compensated.  She was not having any issues with ascites.  Per Dr. Celesta Aver last note in 2018, he also states that she was well compensated at that time.  Currently she is urinating well on Lasix and spironolactone in the hospital.  Denies any issues with bleeding or confusion.  She has been having bowel moods with assistance of MiraLAX and senna.  She is a retired Marine scientist.   Past Medical History:  Diagnosis Date   Allergy    Arthritis    Asthma    Cirrhosis of liver ([place])    due to "medications for arthritis".    Elevated cholesterol    Erosive esophagitis 1995   Fatty liver    GERD (gastroesophageal reflux disease)    Hiatal hernia    LARGE   Hx of colonic polyps 05/27/2018   Hx of transfusion of platelets    Paroxysmal atrial fibrillation (HCC)    Thrombocytopenia (HCC)    Typical atrial flutter (HCC)    UTI (lower urinary tract infection)     Past Surgical History:  Procedure Laterality Date   ABDOMINAL HYSTERECTOMY     partial   APPENDECTOMY     ATRIAL FIBRILLATION ABLATION N/A 09/26/2020   Procedure: ATRIAL FIBRILLATION ABLATION;  Surgeon: Thompson Grayer, MD;  Location: [facility];  Service: Cardiovascular;  Laterality: N/A;   COLONOSCOPY     ESOPHAGOGASTRODUODENOSCOPY     GYNECOLOGIC CRYOSURGERY     x2   REPLACEMENT TOTAL KNEE Left 2016   TONSILLECTOMY     TOTAL KNEE ARTHROPLASTY Right    TUBAL LIGATION     UPPER GASTROINTESTINAL ENDOSCOPY      Prior to Admission medications   Medication Sig Start Date End Date Taking? Authorizing Provider  acetaminophen (TYLENOL) 500 MG tablet Take 500 mg by mouth every 6 (six) hours as needed for moderate pain.   Yes [provider]  apixaban (ELIQUIS) 5 MG TABS tablet Take 1 tablet (5 mg total) by mouth  2 (two) times daily. 02/06/20 01/18/21 Yes Donato Heinz, MD  Ascorbic Acid (VITAMIN C) 1000 MG tablet Take 1,000 mg by mouth daily.   Yes [provider]  atorvastatin (LIPITOR) 40 MG tablet Take 40 mg by mouth daily.   Yes [provider]  cetirizine (ZYRTEC) 10 MG tablet Take 10 mg by mouth in the morning.   Yes [provider]  Cholecalciferol (VITAMIN D-3) 125 MCG (5000 UT) TABS Take 5,000 Units by mouth daily.   Yes [provider]  docusate sodium (COLACE) 100 MG capsule Take 100 mg by mouth 2 (two) times daily.   Yes [provider]  esomeprazole (NEXIUM) 20 MG capsule Take 20 mg by mouth daily before breakfast.   Yes [provider]  ferrous sulfate 325 (65 FE) MG tablet Take 325 mg by mouth daily. 07/01/20  Yes [provider]  flecainide (TAMBOCOR) 100 MG tablet Take 2 tablets (200 mg total) by mouth as directed. Take 2 tablets as needed daily for Afib Patient taking differently: Take 200 mg by mouth daily as needed (a-fib). 02/01/20  Yes Allred, Jeneen Rinks, MD  furosemide (LASIX) 40 MG tablet Take 1 tablet (40 mg total) by mouth daily. 02/26/20 02/20/21 Yes Donato Heinz, MD  ibuprofen (ADVIL) 200 MG tablet Take 200 mg by mouth every 6 (six) hours as needed for mild pain (or headaches).   Yes [provider]  potassium chloride SA (KLOR-CON) 20 MEQ tablet Take 1 tablet (20 mEq total) by mouth daily. 02/27/20 01/18/21 Yes Donato Heinz, MD  vitamin B-12 (CYANOCOBALAMIN) 1000 MCG tablet Take 1,000 mcg by mouth daily.   Yes [provider]  atorvastatin (LIPITOR) 20 MG tablet Take 1 tablet (20 mg total) by mouth daily. Patient not taking: Reported on 01/18/2021 02/26/20 10/23/20  Donato Heinz, MD    Current Facility-Administered Medications  Medication Dose Route Frequency Provider Last Rate Last Admin   acetaminophen (TYLENOL) tablet 1,000 mg  1,000 mg Oral TID Orvis Brill, MD   1,000 mg at 01/23/21 1946   clotrimazole (LOTRIMIN) 1 % cream   Topical BID Demaio, Alexa, MD   Given at 01/24/21 1011   DAPTOmycin (CUBICIN) 450 mg in sodium chloride 0.9 % IVPB  6 mg/kg (Adjusted) Intravenous Q2000 Velna Ochs, MD 118 mL/hr at 01/23/21 1659 450 mg at 01/23/21 1659   [START ON 01/25/2021] furosemide (LASIX) tablet 60 mg  60 mg Oral Daily France Ravens, MD       oxyCODONE (Oxy IR/ROXICODONE) immediate release tablet 5 mg  5 mg Oral Q4H PRN Orvis Brill, MD   5 mg at 01/24/21 1432   pantoprazole (PROTONIX) EC tablet 40 mg  40 mg Oral Daily Maudie Mercury, MD   40 mg at 01/24/21 1010  piperacillin-tazobactam (ZOSYN) IVPB 3.375 g  3.375 g Intravenous Q8H Velna Ochs, MD 12.5 mL/hr at 01/24/21 1022 3.375 g at 01/24/21 1022   polyethylene glycol (MIRALAX / GLYCOLAX) packet 17 g  17 g Oral Daily Iona Beard, MD   17 g at 01/24/21 1010   senna (SENOKOT) tablet 17.2 mg  2 tablet Oral Daily Iona Beard, MD   17.2 mg at 01/24/21 1010   [START ON 01/25/2021] spironolactone (ALDACTONE) tablet 150 mg  150 mg Oral Daily France Ravens, MD       traMADol Veatrice Bourbon) tablet 50 mg  50 mg Oral Q6H PRN Iona Beard, MD   50 mg at 01/19/21 1108    Allergies as of 01/17/2021 - Review Complete 10/23/2020  Allergen Reaction Noted   Iodine Hives 05/10/2010   Shellfish allergy Hives 12/02/2015    Family History  Problem Relation Age of Onset   Esophageal cancer Father    Hypertension Mother    Colon polyps Neg Hx    Colon cancer Neg Hx    Rectal cancer Neg Hx    Stomach cancer Neg Hx     Social History   Tobacco Use   Smoking status: Never   Smokeless tobacco: Never  Substance Use Topics   Alcohol use: Yes    Alcohol/week: 1.0 standard drink    Types: 1 Glasses of wine per week   Drug use: No    Review of Systems: All systems reviewed and negative except where noted in HPI.  Physical Exam: Vital signs in last 24 hours: Temp:  [97.8 F (36.6 C)-98.1 F  (36.7 C)] 97.8 F (36.6 C) (11/04 0746) Pulse Rate:  [91-95] 95 (11/04 0746) Resp:  [18] 18 (11/04 0412) BP: (132-141)/(63-67) 134/67 (11/04 0746) SpO2:  [92 %-97 %] 92 % (11/04 0746) Weight:  [94.4 kg] 94.4 kg (11/04 0412) Last BM Date: 01/20/21 General:   Awake, alert, NAD Psych:  Pleasant, cooperative. Normal mood and affect. Eyes:  Pupils equal, sclera clear, no icterus.    Neck:  Supple; no masses Lungs:  Clear throughout to auscultation Heart: Systolic ejection murmur present Abdomen: Erythematous skin over the periumbilical area and extending to the left flank.  Periumbilical hernia that feels hard.  Mildly tender to palpation.  Distended abdomen. Extremities: 2+ BLE edema Msk:  Symmetrical without gross deformities. . Neurologic:  Alert and  oriented x4;  grossly normal neurologically.   Intake/Output from previous day: 11/03 0701 - 11/04 0700 In: 467.1 [P.O.:240; IV Piggyback:227.1] Out: -  Intake/Output this shift: Total I/O In: -  Out: 450 [Urine:450]  Lab Results: Recent Labs    01/22/21 0332 01/23/21 0403 01/24/21 0331  WBC 29.6* 39.0* 44.6*  HGB 11.1* 11.4* 11.7*  HCT 33.7* 35.4* 36.7  PLT 142* 173 214   BMET Recent Labs    01/22/21 0332 01/23/21 0403 01/24/21 0331  NA 135 135 134*  K 3.4* 3.9 3.9  CL 103 101 102  CO2 28 28 28   GLUCOSE 104* 104* 96  BUN 6* 5* 5*  CREATININE 0.40* 0.45 0.45  CALCIUM 7.9* 8.1* 8.5*   LFT No results for input(s): PROT, ALBUMIN, AST, ALT, ALKPHOS, BILITOT, BILIDIR, IBILI in the last 72 hours. PT/INR No results for input(s): LABPROT, INR in the last 72 hours. Hepatitis Panel No results for input(s): HEPBSAG, HCVAB, HEPAIGM, HEPBIGM in the last 72 hours.   . CBC Latest Ref Rng & Units 01/24/2021 01/23/2021 01/22/2021  WBC 4.0 - 10.5 K/uL 44.6(H) 39.0(H) 29.6(H)  Hemoglobin 12.0 -  15.0 g/dL 11.7(L) 11.4(L) 11.1(L)  Hematocrit 36.0 - 46.0 % 36.7 35.4(L) 33.7(L)  Platelets 150 - 400 K/uL 214 173 142(L)     . CMP Latest Ref Rng & Units 01/24/2021 01/23/2021 01/22/2021  Glucose 70 - 99 mg/dL 96 104(H) 104(H)  BUN 8 - 23 mg/dL 5(L) 5(L) 6(L)  Creatinine 0.44 - 1.00 mg/dL 0.45 0.45 0.40(L)  Sodium 135 - 145 mmol/L 134(L) 135 135  Potassium 3.5 - 5.1 mmol/L 3.9 3.9 3.4(L)  Chloride 98 - 111 mmol/L 102 101 103  CO2 22 - 32 mmol/L 28 28 28   Calcium 8.9 - 10.3 mg/dL 8.5(L) 8.1(L) 7.9(L)  Total Protein 6.5 - 8.1 g/dL - - -  Total Bilirubin 0.3 - 1.2 mg/dL - - -  Alkaline Phos 38 - 126 U/L - - -  AST 15 - 41 U/L - - -  ALT 0 - 44 U/L - - -   Studies/Results: CT ABDOMEN PELVIS W CONTRAST  Result Date: 01/24/2021 CLINICAL DATA:  Abdominal pain EXAM: CT ABDOMEN AND PELVIS WITH CONTRAST TECHNIQUE: Multidetector CT imaging of the abdomen and pelvis was performed using the standard protocol following bolus administration of intravenous contrast. CONTRAST:  112mL OMNIPAQUE IOHEXOL 350 MG/ML SOLN COMPARISON:  01/18/2021 FINDINGS: Lower chest: Heart is enlarged in size. Small linear densities seen in the left posterior costophrenic angle suggesting minimal scarring or subsegmental atelectasis. There is interval decrease in left pleural effusion. Hepatobiliary: Liver measures 17.3 cm in length. There is nodularity in the liver surface. No definite focal abnormality is seen in the liver. There is no dilation of bile ducts. There is small calcified gallbladder stone. There is no wall thickening in gallbladder. Pancreas: There is atrophy. Spleen: Spleen measures 17.7 cm in maximum diameter. Splenule is seen along the medial margin of spleen. There are tortuous vessels in the splenic hilum, possibly suggesting portal hypertension. Adrenals/Urinary Tract: Adrenals are not enlarged. There is no hydronephrosis. There are 2 small calculi in the lower pole of left kidney. There are no demonstrable ureteral stones. Urinary bladder is not distended. Stomach/Bowel: Stomach is unremarkable. Small bowel loops are not dilated.  Appendix is not distinctly seen. There is no pericecal inflammation. There is no significant wall thickening in colon. Vascular/Lymphatic: There is recanalization of umbilical vein. There are tortuous vessels in the upper abdomen and anterior abdominal wall suggesting portosystemic shunt due to portal hypertension. There are scattered atherosclerotic plaques and calcifications in the aorta and its major branches. There are slightly enlarged lymph nodes in the retroperitoneum with no significant change. Reproductive: Uterus is not seen.  There are no adnexal masses. Other: Small ascites is present. There is no pneumoperitoneum. There is diffuse edema in the abdominal wall, especially in the left mid and lower abdomen. Umbilical hernia containing fat is seen. There is 6.2 cm loculated fluid collection in the left paraumbilical region with no significant change. There are no pockets of air within this fluid collection. Musculoskeletal: Degenerative changes are noted in the lower lumbar spine with encroachment of neural foramina and mild spinal stenosis. IMPRESSION: There is no evidence of intestinal obstruction or pneumoperitoneum. There is no hydronephrosis. Cirrhosis of liver. Small ascites. Portal hypertension. Splenomegaly. Gallbladder stone. Small left renal stones. There is 6.2 cm loculated fluid collection in the left paraumbilical region with no significant interval change. Differential diagnostic possibilities would include entrapment of ascitic fluid in the paraumbilical hernia or resolving hematoma or an abscess. There is edema in subcutaneous plane, especially in the left lower and left  mid abdomen suggesting contusion or cellulitis. There is slight worsening of this finding. Electronically Signed   By: Elmer Picker M.D.   On: 01/24/2021 14:04    Active Problems:   Abdominal wall cellulitis   Periumbilical hernia   Other ascites   Periumbilical abdominal pain   NASH (nonalcoholic  steatohepatitis)   Myeloproliferative disease (HCC)   Thrombocytopenia (HCC)    Christia Reading, M.D. @  01/24/2021, 5:48 PM

## 2021-01-24 NOTE — Progress Notes (Signed)
[facility]  Date of Admission:  01/17/2021      Total days of antibiotics 5           ASSESSMENT: Savannah Hobbs is a 75 y.o. female with incarcerated hernia here with worsening WBC count (though has underlying myeloproliferative process) and ongoing pain with erythema.  Surgery has been re consulted and recommended repeating CT scan. Very high operative risk associated with co morbidities.  Hard to know if ascitic fluid is already infected - she has tenderness more localized to lower/periumbilical abdomen. Will continue daptomycin + zosyn pending CT scan and surgery recommendations.   PLAN: Continue zosyn  Continue daptomycin  Follow for CT scan result  Follow for further surgical recommendations.    Active Problems:   Abdominal wall cellulitis   Periumbilical hernia   Other ascites    acetaminophen  1,000 mg Oral TID   clotrimazole   Topical BID   [START ON 01/25/2021] furosemide  60 mg Oral Daily   pantoprazole  40 mg Oral Daily   polyethylene glycol  17 g Oral Daily   senna  2 tablet Oral Daily   [START ON 01/25/2021] spironolactone  150 mg Oral Daily    SUBJECTIVE: No significant change in her symptoms. Nothing new to declare. Pain is tough to tolerate.  No S/E to antibiotics she is aware of.   Review of Systems: Review of Systems  Constitutional:  Negative for chills and fever.  Gastrointestinal:  Positive for abdominal pain.  Skin:  Positive for rash.   Allergies  Allergen Reactions   Shellfish Allergy Hives    OBJECTIVE: Vitals:   01/23/21 1214 01/23/21 1945 01/24/21 0412 01/24/21 0746  BP: (!) 154/84 132/63 (!) 141/64 134/67  Pulse: 99 91 93 95  Resp: 18 18 18    Temp: 98.8 F (37.1 C) 98.1 F (36.7 C) 98 F (36.7 C) 97.8 F (36.6 C)  TempSrc: Oral Oral Oral Oral  SpO2: 93% 97% 93% 92%  Weight:   94.4 kg   Height:       Body mass index is 34.63 kg/m.  Physical Exam Vitals reviewed.   Constitutional:      Appearance: She is well-developed. She is obese.     Comments: Seated on side of bed. Appears to be uncomfortable.   Abdominal:     General: There is distension.     Tenderness: There is abdominal tenderness in the periumbilical area, left upper quadrant and left lower quadrant.     Hernia: A hernia is present. Hernia is present in the umbilical area.  Skin:    Comments: Similar appearing erythema noted around umbilicus over panus. Warm.     Lab Results Lab Results  Component Value Date   WBC 44.6 (H) 01/24/2021   HGB 11.7 (L) 01/24/2021   HCT 36.7 01/24/2021   MCV 91.1 01/24/2021   PLT 214 01/24/2021    Lab Results  Component Value Date   CREATININE 0.45 01/24/2021   BUN 5 (L) 01/24/2021   NA 134 (L) 01/24/2021   K 3.9 01/24/2021   CL 102 01/24/2021   CO2 28 01/24/2021    Lab Results  Component Value Date   ALT 15 01/19/2021   AST 27 01/19/2021   ALKPHOS 66 01/19/2021   BILITOT 1.7 (H) 01/19/2021     Microbiology: Recent Results (from the past 240 hour(s))  Resp Panel by RT-PCR (Flu A&B, Covid) Nasopharyngeal Swab  Status: None   Collection Time: 01/18/21  4:40 AM   Specimen: Nasopharyngeal Swab; Nasopharyngeal(NP) swabs in vial transport medium  Result Value Ref Range Status   SARS Coronavirus 2 by RT PCR NEGATIVE NEGATIVE Final    Comment: (NOTE) SARS-CoV-2 target nucleic acids are NOT DETECTED.  The SARS-CoV-2 RNA is generally detectable in upper respiratory specimens during the acute phase of infection. The lowest concentration of SARS-CoV-2 viral copies this assay can detect is 138 copies/mL. A negative result does not preclude SARS-Cov-2 infection and should not be used as the sole basis for treatment or other patient management decisions. A negative result may occur with  improper specimen collection/handling, submission of specimen other than nasopharyngeal swab, presence of viral mutation(s) within the areas targeted by this  assay, and inadequate number of viral copies(<138 copies/mL). A negative result must be combined with clinical observations, patient history, and epidemiological information. The expected result is Negative.  Fact Sheet for Patients:  EntrepreneurPulse.com.au  Fact Sheet for Healthcare Providers:  IncredibleEmployment.be  This test is no t yet approved or cleared by the [place] FDA and  has been authorized for detection and/or diagnosis of SARS-CoV-2 by FDA under an Emergency Use Authorization (EUA). This EUA will remain  in effect (meaning this test can be used) for the duration of the COVID-19 declaration under Section 564(b)(1) of the Act, 21 U.S.C.section 360bbb-3(b)(1), unless the authorization is terminated  or revoked sooner.       Influenza A by PCR NEGATIVE NEGATIVE Final   Influenza B by PCR NEGATIVE NEGATIVE Final    Comment: (NOTE) The Xpert Xpress SARS-CoV-2/FLU/RSV plus assay is intended as an aid in the diagnosis of influenza from Nasopharyngeal swab specimens and should not be used as a sole basis for treatment. Nasal washings and aspirates are unacceptable for Xpert Xpress SARS-CoV-2/FLU/RSV testing.  Fact Sheet for Patients: EntrepreneurPulse.com.au  Fact Sheet for Healthcare Providers: IncredibleEmployment.be  This test is not yet approved or cleared by the [place] FDA and has been authorized for detection and/or diagnosis of SARS-CoV-2 by FDA under an Emergency Use Authorization (EUA). This EUA will remain in effect (meaning this test can be used) for the duration of the COVID-19 declaration under Section 564(b)(1) of the Act, 21 U.S.C. section 360bbb-3(b)(1), unless the authorization is terminated or revoked.  Performed at [facility], [place] [address] [place], [place] [address]   Culture, blood (single)     Status: None   Collection Time: 01/18/21  4:40 AM    Specimen: BLOOD LEFT HAND  Result Value Ref Range Status   Specimen Description BLOOD LEFT HAND  Final   Special Requests   Final    BOTTLES DRAWN AEROBIC AND ANAEROBIC Blood Culture adequate volume   Culture   Final    NO GROWTH 5 DAYS Performed at [facility], [place] [address] [place], [place] [address]    Report Status 01/23/2021 FINAL  Final  MRSA Next Gen by PCR, Nasal     Status: None   Collection Time: 01/18/21 10:25 AM   Specimen: Nasal Mucosa; Nasal Swab  Result Value Ref Range Status   MRSA by PCR Next Gen NOT DETECTED NOT DETECTED Final    Comment: (NOTE) The GeneXpert MRSA Assay (FDA approved for NASAL specimens only), is one component of a comprehensive MRSA colonization surveillance program. It is not intended to diagnose MRSA infection nor to guide or monitor treatment for MRSA infections. Test performance is not FDA approved in  patients less than 28 years old. Performed at [facility], [place] [address], [place], [place] [address]     Janene Madeira, MSN, NP-C [facility] Pager: [phone]  @TODAY @ 3:48 PM

## 2021-01-24 NOTE — Progress Notes (Signed)
Mobility Specialist Progress Note:   01/24/21 1453  Mobility  Activity Ambulated to bathroom  Level of Assistance Standby assist, set-up cues, supervision of patient - no hands on  Assistive Device Front wheel walker  Distance Ambulated (ft) 20 ft  Mobility Out of bed for toileting  Mobility Response Tolerated well  Mobility performed by Mobility specialist  $Mobility charge 1 Mobility   Pt received requesting to get back to bed from bathroom. Pt left EOB with call bell in reach and all needs met.   [facility] and safety inspector Phone [phone]

## 2021-01-24 NOTE — Plan of Care (Signed)

## 2021-01-24 NOTE — Progress Notes (Signed)
Pharmacy Antibiotic Note  Savannah Hobbs is a 75 y.o. female admitted on 01/17/2021 with strangulated piece of omentum in her hernia, no surgery is planned. She also has abdominal wall cellulitis.  Pharmacy consulted for daptomycin and zosyn dosing.  Good renal function ClCr ~70 ml/min. WBC 25> up to 44.6 Afebrile.   Plan:  Zosyn 3.375g IV q 8 hrs (extended interval infusion) Daptomycin 6mg  /kg q24h of (ABW)   Hold atorvastatin for now Check CK on fridays    Height: 5\' 5"  (165.1 cm) Weight: 94.4 kg (208 lb 1.6 oz) IBW/kg (Calculated) : 57  Temp (24hrs), Avg:98.2 F (36.8 C), Min:97.8 F (36.6 C), Max:98.8 F (37.1 C)  Recent Labs  Lab 01/17/21 2225 01/18/21 0429 01/19/21 0546 01/20/21 0833 01/21/21 0338 01/22/21 0332 01/23/21 0403 01/24/21 0331  WBC 34.1*  --  25.8* 31.9* 32.2* 29.6* 39.0* 44.6*  CREATININE 0.53  --  0.43*  --   --  0.40* 0.45 0.45  LATICACIDVEN  --  1.8  --   --   --   --   --   --      Estimated Creatinine Clearance: 72.3 mL/min (by C-G formula based on SCr of 0.45 mg/dL).    Allergies  Allergen Reactions   Iodine Hives   Shellfish Allergy Hives    Antimicrobials this admission: Vanc 10/29 >>  Ceftriaxone 10/29 >> 10/30 Pip/tazp 10/30 >>   Dose adjustments this admission: N/A  Microbiology results: 10/29 BCx: ngtd 10/29 MRSA PCR: ng   Marguerite Olea, [facility] Clinical Pharmacist  01/24/2021 8:51 AM   [facility] pharmacy phone numbers are listed on [email or web address]

## 2021-01-24 NOTE — TOC Progression Note (Signed)
Transition of Care [facility]) - Progression Note    Patient Details  Name: Savannah Hobbs MRN: [id number] Date of Birth: December 24, 1945  Transition of [facility]) CM/SW Contact  Zenon Mayo, RN Phone Number: 01/24/2021, 3:46 PM  Clinical Narrative:    from home, cellulits , surgery consulted , conts on iv abx. Incarcerated omental hernia Myelodysplastic syndrome. TOC will continue to follow for dc needs.        Expected Discharge Plan and Services                                                 Social Determinants of Health (SDOH) Interventions    Readmission Risk Interventions No flowsheet data found.

## 2021-01-24 NOTE — Progress Notes (Signed)
I was talking to patient's husband on the phone regarding her high risk of mortality with surgery.  Her husband expresses concern of the 70% chance of mortality of her abdominal surgery.  I explained to him that she is high risk for bleeding, poor wound healing and infection with surgery due to her cirrhosis and ascites.  On the other hand, the hernia can cause worsening inflammation/infection.  It can take a long time for that piece of omentum to infarct and die.  I advised him to discuss with the patient and family.  We will reach out to surgery team and discussed with patient again regarding the decision of surgery.

## 2021-01-24 NOTE — Progress Notes (Addendum)
Subjective:   Overnight events: vaginal itching, patient provided lotrim lotion  Patient seen and assessed at bedside this morning.  Patient states that she still feels sore in abdomen but states that the swelling and redness in her abdomen have not changed since yesterday.  Patient had a bowel movement yesterday.  Patient has been urinating 1 uncertain quantity but approximates 1.2 L of urine yesterday.  Discussed plan with patient to have a CT with contrast to reassess abdominal hernia and if there have been any changes to fluid in abdomen.  Discussed how antibiotics were addressing skin infection/possible abdominal infection.  Also discussed that we would be speaking with surgery in order for her to have them weigh in regarding patient's abdominal hernia and whether this was appropriate progression of herniated omentum dying off.  Patient request GI physician to see her in order to discuss options for her strangulated omental hernia.  Patient verbalized understanding and had no other questions.  Objective:  Vital signs in last 24 hours: Vitals:   01/23/21 0349 01/23/21 1214 01/23/21 1945 01/24/21 0412  BP: (!) 146/82 (!) 154/84 132/63 (!) 141/64  Pulse: (!) 108 99 91 93  Resp: 17 18 18 18   Temp: 98.5 F (36.9 C) 98.8 F (37.1 C) 98.1 F (36.7 C) 98 F (36.7 C)  TempSrc: Oral Oral Oral Oral  SpO2: 93% 93% 97% 93%  Weight: 93.8 kg   94.4 kg  Height:       CBC:  WBC: 29.6 -> 39.0 -> 44.6 RBC: 3.74 -> 3.93 -> 4.03 Hgb: 11.1 -> 11.4 -> 11.7  Constitutional: alert, well-appearing, in NAD but appears uncomfortable HENT: normocephalic, atraumatic, mucous membranes moist Eyes: conjunctiva non-erythematous, EOMI Cardiovascular: Normal rate, regular rhythm, systolic murmur best heard at the left upper sternal border Pulmonary/Chest: normal work of breathing on room air, LCTAB GI: Periumbilical erythema extending down to her left flank. Umbilical hernia appears tense with shiny skin  small area of darkness at the 12 o'clock position of the the umbilicus, no drainage or bleeding, bowel sounds present, abdomen is soft.     MSK: normal bulk and tone Neurological: A&O x 3 Skin: warm and dry Psych: normal behavior, normal affect   Assessment/Plan:  Active Problems:   Abdominal wall cellulitis   Periumbilical hernia   Other ascites  Ashaunti Treptow is a 75 y.o. with pertinent PMH of PAF, Hiatal hernia, cirrhosis, and GERD who presented with abdominal pain and admit for treatment of abdominal wall cellulitis on hospital day 6.    #Nonpurulent  Abdominal Wall Cellulitis complicated/secondary to strangulated hernia #Fat-Containing Periumbilical Hernia #Dehydration Vitals stable, remains afebrile x 2 days. WBC uptrending 29.6 -> 39 -> 44.6 (baseline around 25 given myeloproliferative neoplasm). Uncertain if this is reactive to strangulated hernia or another etiology. Gen surg signed off, recommending OP surgical f/u with no indications for surgery at this time. Abdominal pain and nonpurulent cellulitis on exam. - Pain management: Tylenol 1000 bid mg, tramadol 50 mg q6h prn, Oxy IR 5 mg q4h prn - Bowel regimen to prevent abdominal strain - Trend fever curve and CBCs -- Given lack of improvement, re consulting general surgery  -- PT recommending no f/u  -- Per ID recs, continue IV daptomycin & IV Zosyn  -- Continue monitor bowel --Reconsulted surgery for recommendations regarding progression of dying omentum.  Appreciate recommendations. --GI will courtesy consult to speak with patient. Appreciate recommendations.  #Paroxysmal Atrial Fibrillation S/P Ablation 09/26/2020 #A. Flutter CHA2DS2-VASc score of 2, on  Eliquis 5 mg twice daily and flecainide 200 mg as needed daily.  Atrial fibrillation appears to be in the setting of COVID-19 infection 2020, was found to be in atrial flutter in 2021, hospitalization. She is status post ablation by Dr. Rayann Heman in July 2022. She  has not had to use her flecainide since her ablation. Remains in sinus rhythm tachycardia has resolved. - Continue Eliquis 5 mg twice daily   #Cirrhosis with Portal Hypertension #Thrombocytopenia In the setting of medication use for knee pain. Confirmed by Dr. Carlean Purl in 2018.  Appears volume overloaded today. Plan to resume her home lasix.  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  Not currently on treatment, monitored by her oncologist.   Prior to Admission Living Arrangement: Home Anticipated Discharge Location: Home Barriers to Discharge: Further medical treatment Dispo: Anticipated discharge in approximately 1-2 day(s).    France Ravens, MD 01/24/2021, 6:19 AM Pager: [phone] After 5pm on weekdays and 1pm on weekends: On Call pager [phone]

## 2021-01-25 DIAGNOSIS — L03311 Cellulitis of abdominal wall: Secondary | ICD-10-CM | POA: Diagnosis not present

## 2021-01-25 DIAGNOSIS — K420 Umbilical hernia with obstruction, without gangrene: Principal | ICD-10-CM

## 2021-01-25 DIAGNOSIS — I480 Paroxysmal atrial fibrillation: Secondary | ICD-10-CM | POA: Diagnosis not present

## 2021-01-25 DIAGNOSIS — K766 Portal hypertension: Secondary | ICD-10-CM

## 2021-01-25 DIAGNOSIS — E785 Hyperlipidemia, unspecified: Secondary | ICD-10-CM

## 2021-01-25 DIAGNOSIS — E860 Dehydration: Secondary | ICD-10-CM | POA: Diagnosis not present

## 2021-01-25 DIAGNOSIS — K219 Gastro-esophageal reflux disease without esophagitis: Secondary | ICD-10-CM

## 2021-01-25 DIAGNOSIS — I4892 Unspecified atrial flutter: Secondary | ICD-10-CM

## 2021-01-25 LAB — COMPREHENSIVE METABOLIC PANEL
ALT: 16 U/L (ref 0–44)
AST: 44 U/L — ABNORMAL HIGH (ref 15–41)
Albumin: 2.4 g/dL — ABNORMAL LOW (ref 3.5–5.0)
Alkaline Phosphatase: 83 U/L (ref 38–126)
Anion gap: 10 (ref 5–15)
BUN: 7 mg/dL — ABNORMAL LOW (ref 8–23)
CO2: 24 mmol/L (ref 22–32)
Calcium: 8.1 mg/dL — ABNORMAL LOW (ref 8.9–10.3)
Chloride: 100 mmol/L (ref 98–111)
Creatinine, Ser: 0.41 mg/dL — ABNORMAL LOW (ref 0.44–1.00)
GFR, Estimated: >60 mL/min (ref >60)
Glucose, Bld: 78 mg/dL (ref 70–99)
Potassium: 4.2 mmol/L (ref 3.5–5.1)
Sodium: 134 mmol/L — ABNORMAL LOW (ref 135–145)
Total Bilirubin: 1.2 mg/dL (ref 0.3–1.2)
Total Protein: 6.4 g/dL — ABNORMAL LOW (ref 6.5–8.1)

## 2021-01-25 LAB — PROTIME-INR
INR: 1.7 — ABNORMAL HIGH (ref 0.8–1.2)
Prothrombin Time: 19.9 seconds — ABNORMAL HIGH (ref 11.4–15.2)

## 2021-01-25 LAB — CBC
HCT: 32.9 % — ABNORMAL LOW (ref 36.0–46.0)
Hemoglobin: 10.9 g/dL — ABNORMAL LOW (ref 12.0–15.0)
MCH: 29.9 pg (ref 26.0–34.0)
MCHC: 33.1 g/dL (ref 30.0–36.0)
MCV: 90.1 fL (ref 80.0–100.0)
Platelets: 214 10*3/uL (ref 150–400)
RBC: 3.65 MIL/uL — ABNORMAL LOW (ref 3.87–5.11)
RDW: 19.3 % — ABNORMAL HIGH (ref 11.5–15.5)
WBC: 42.8 10*3/uL — ABNORMAL HIGH (ref 4.0–10.5)
nRBC: 0.1 % (ref 0.0–0.2)

## 2021-01-25 NOTE — Progress Notes (Addendum)
Subjective:   Overnight events: none  Patient seen and assessed at bedside this morning with son and husband in room.  Patient states that her swelling is slightly reduced and she feels less abdominal pain.  However, patient still endorsing increased redness around abdomen.  Patient states she has been able to eat appropriately and move her bowels.  Had lengthy discussion with patient with regards to her prognosis and management of her abdominal hernia.  Discussed with family and her the mortality percentages that were listed by Dr. Lorenso Courier should patient proceed with surgical intervention of hernia.  Given the increased mortality due to moderate cirrhosis with surgical interventions, patient and family request additional discussions regarding surgery with plan to continue antibiotics and monitoring for now.  Also discussed broad-spectrum antibiotics that the patient is currently on and the possibility that there is poor penetrance of the antibiotics to certain parts of the body.  All questions and concerns were addressed and family had no other questions by the end of assessment.  Objective:  Vital signs in last 24 hours: Vitals:   01/24/21 0746 01/24/21 1947 01/25/21 0540 01/25/21 0809  BP: 134/67 (!) 145/60 138/60 (!) 161/73  Pulse: 95 98 97 (!) 102  Resp:  17 18   Temp: 97.8 F (36.6 C) 98 F (36.7 C) 98.7 F (37.1 C) 98 F (36.7 C)  TempSrc: Oral Oral Oral Oral  SpO2: 92% 95% 96% 96%  Weight:   93.7 kg   Height:      Net I/O: -1499.2 mL  CBC:  WBC: 39.0 > 44.6 > 42.8 Hgb: 11.4 > 11.7 > 10.9 PT: 19.9 INR: 1.7  Constitutional: alert, well-appearing, comfortable, in NAD HENT: normocephalic, atraumatic, mucous membranes moist Eyes: conjunctiva non-erythematous, EOMI Cardiovascular: Normal rate, regular rhythm, systolic murmur best heard at the left upper sternal border Pulmonary/Chest: normal work of breathing on room air, LCTAB GI: Worsening periumbilical erythema extending  down to her left flank. Umbilical hernia appears tense with shiny skin small area of darkness at the 12 o'clock position of the the umbilicus, no drainage or bleeding, bowel sounds present, abdomen is soft.     MSK: normal bulk and tone Neurological: A&O x 3 Skin: warm and dry Psych: normal behavior, normal affect   Assessment/Plan:  Active Problems:   Abdominal wall cellulitis   Periumbilical hernia   Other ascites   Periumbilical abdominal pain   NASH (nonalcoholic steatohepatitis)   Myeloproliferative disease ([place])   Thrombocytopenia ([facility])  Savannah Hobbs is a 75 y.o. with pertinent PMH of PAF, Hiatal hernia, cirrhosis, and GERD who presented with abdominal pain and admit for treatment of abdominal wall cellulitis on hospital day 7.    #Nonpurulent  Abdominal Wall Cellulitis complicated/secondary to strangulated hernia #Fat-Containing Periumbilical Hernia #Dehydration Vitals stable, remains afebrile x 2 days. WBC uptrending 29.6 -> 39 -> 44.6 (baseline around 25 given myeloproliferative neoplasm). Uncertain if this is reactive to strangulated hernia or another etiology. Gen surg signed off, recommending OP surgical f/u with no indications for surgery at this time. Abdominal pain and nonpurulent cellulitis on exam. - Pain management: Tylenol 1000 bid mg, tramadol 50 mg q6h prn, Oxy IR 5 mg q4h prn - Bowel regimen to prevent abdominal strain - Trend fever curve and CBCs -- Given lack of improvement, re consulting general surgery  -- PT recommending no f/u  -- Per ID recs, continue IV daptomycin & IV Zosyn  -- Continue monitor bowel --Reconsulted surgery for recommendations regarding progression of dying  omentum.  Appreciate recommendations. --GI will courtesy consult to speak with patient. Appreciate recommendations.  #Paroxysmal Atrial Fibrillation S/P Ablation 09/26/2020 #A. Flutter CHA2DS2-VASc score of 2, on Eliquis 5 mg twice daily and flecainide 200 mg as needed  daily.  Atrial fibrillation appears to be in the setting of COVID-19 infection 2020, was found to be in atrial flutter in 2021, hospitalization. She is status post ablation by Dr. Rayann Heman in July 2022. She has not had to use her flecainide since her ablation. Remains in sinus rhythm tachycardia has resolved. - Continue Eliquis 5 mg twice daily   #Cirrhosis with Portal Hypertension #Thrombocytopenia In the setting of medication use for knee pain. Confirmed by Dr. Carlean Purl in 2018.  Appears volume overloaded today. Plan to resume her home lasix.  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  Not currently on treatment, monitored by her oncologist.   Prior to Admission Living Arrangement: Home Anticipated Discharge Location: Home Barriers to Discharge: Further medical treatment Dispo: Anticipated discharge in approximately 1-2 day(s).    Savannah Ravens, MD 01/25/2021, 9:38 AM Pager: [phone] After 5pm on weekdays and 1pm on weekends: On Call pager [phone]

## 2021-01-25 NOTE — Progress Notes (Signed)
Patient ID: Savannah Hobbs, female   DOB: 26-Dec-1945, 75 y.o.   MRN: [id number] [facility] Surgery Progress Note     Subjective: CC-  Family at bedside. Overall feeling better since admission, but has persistent periumbilical pain and worsening erythema. CT yesterday overall fairly stable. WBC 42.8 from 44.6, afebrile. Tolerating diet and having bowel function.  Objective: Vital signs in last 24 hours: Temp:  [98 F (36.7 C)-98.7 F (37.1 C)] 98 F (36.7 C) (11/05 0809) Pulse Rate:  [97-102] 102 (11/05 0809) Resp:  [17-18] 18 (11/05 0540) BP: (138-161)/(60-73) 161/73 (11/05 0809) SpO2:  [95 %-96 %] 96 % (11/05 0809) Weight:  [93.7 kg] 93.7 kg (11/05 0540) Last BM Date: 01/24/21  Intake/Output from previous day: 11/04 0701 - 11/05 0700 In: 1050.8 [P.O.:840; IV Piggyback:210.8] Out: 2550 [Urine:2550] Intake/Output this shift: No intake/output data recorded.  PE: Gen:  Alert, NAD, pleasant Pulm: rate and effort normal Abd: soft, focally tender at umbilicus, umbilicus hernia is firm with extensive cellulitis, no purulent drainage    Lab Results:  Recent Labs    01/24/21 0331 01/25/21 0705  WBC 44.6* 42.8*  HGB 11.7* 10.9*  HCT 36.7 32.9*  PLT 214 214   BMET Recent Labs    01/24/21 0331 01/25/21 0433  NA 134* 134*  K 3.9 4.2  CL 102 100  CO2 28 24  GLUCOSE 96 78  BUN 5* 7*  CREATININE 0.45 0.41*  CALCIUM 8.5* 8.1*   PT/INR Recent Labs    01/25/21 0433  LABPROT 19.9*  INR 1.7*   CMP     Component Value Date/Time   NA 134 (L) 01/25/2021 0433   NA 140 09/02/2020 1521   K 4.2 01/25/2021 0433   CL 100 01/25/2021 0433   CO2 24 01/25/2021 0433   GLUCOSE 78 01/25/2021 0433   BUN 7 (L) 01/25/2021 0433   BUN 9 09/02/2020 1521   CREATININE 0.41 (L) 01/25/2021 0433   CALCIUM 8.1 (L) 01/25/2021 0433   PROT 6.4 (L) 01/25/2021 0433   PROT 7.7 07/23/2020 1609   ALBUMIN 2.4 (L) 01/25/2021 0433   ALBUMIN 3.8 07/23/2020 1609   AST 44 (H)  01/25/2021 0433   ALT 16 01/25/2021 0433   ALKPHOS 83 01/25/2021 0433   BILITOT 1.2 01/25/2021 0433   BILITOT 1.0 07/23/2020 1609   GFRNONAA >60 01/25/2021 0433   GFRAA 118 03/05/2020 1629   Lipase     Component Value Date/Time   LIPASE 35 01/17/2021 2225       Studies/Results: CT ABDOMEN PELVIS W CONTRAST  Result Date: 01/24/2021 CLINICAL DATA:  Abdominal pain EXAM: CT ABDOMEN AND PELVIS WITH CONTRAST TECHNIQUE: Multidetector CT imaging of the abdomen and pelvis was performed using the standard protocol following bolus administration of intravenous contrast. CONTRAST:  144mL OMNIPAQUE IOHEXOL 350 MG/ML SOLN COMPARISON:  01/18/2021 FINDINGS: Lower chest: Heart is enlarged in size. Small linear densities seen in the left posterior costophrenic angle suggesting minimal scarring or subsegmental atelectasis. There is interval decrease in left pleural effusion. Hepatobiliary: Liver measures 17.3 cm in length. There is nodularity in the liver surface. No definite focal abnormality is seen in the liver. There is no dilation of bile ducts. There is small calcified gallbladder stone. There is no wall thickening in gallbladder. Pancreas: There is atrophy. Spleen: Spleen measures 17.7 cm in maximum diameter. Splenule is seen along the medial margin of spleen. There are tortuous vessels in the splenic hilum, possibly suggesting portal hypertension. Adrenals/Urinary Tract: Adrenals are not enlarged.  There is no hydronephrosis. There are 2 small calculi in the lower pole of left kidney. There are no demonstrable ureteral stones. Urinary bladder is not distended. Stomach/Bowel: Stomach is unremarkable. Small bowel loops are not dilated. Appendix is not distinctly seen. There is no pericecal inflammation. There is no significant wall thickening in colon. Vascular/Lymphatic: There is recanalization of umbilical vein. There are tortuous vessels in the upper abdomen and anterior abdominal wall suggesting  portosystemic shunt due to portal hypertension. There are scattered atherosclerotic plaques and calcifications in the aorta and its major branches. There are slightly enlarged lymph nodes in the retroperitoneum with no significant change. Reproductive: Uterus is not seen.  There are no adnexal masses. Other: Small ascites is present. There is no pneumoperitoneum. There is diffuse edema in the abdominal wall, especially in the left mid and lower abdomen. Umbilical hernia containing fat is seen. There is 6.2 cm loculated fluid collection in the left paraumbilical region with no significant change. There are no pockets of air within this fluid collection. Musculoskeletal: Degenerative changes are noted in the lower lumbar spine with encroachment of neural foramina and mild spinal stenosis. IMPRESSION: There is no evidence of intestinal obstruction or pneumoperitoneum. There is no hydronephrosis. Cirrhosis of liver. Small ascites. Portal hypertension. Splenomegaly. Gallbladder stone. Small left renal stones. There is 6.2 cm loculated fluid collection in the left paraumbilical region with no significant interval change. Differential diagnostic possibilities would include entrapment of ascitic fluid in the paraumbilical hernia or resolving hematoma or an abscess. There is edema in subcutaneous plane, especially in the left lower and left mid abdomen suggesting contusion or cellulitis. There is slight worsening of this finding. Electronically Signed   By: Elmer Picker M.D.   On: 01/24/2021 14:04    Anti-infectives: Anti-infectives (From admission, onward)    Start     Dose/Rate Route Frequency Ordered Stop   01/24/21 0900  piperacillin-tazobactam (ZOSYN) IVPB 3.375 g        3.375 g 12.5 mL/hr over 240 Minutes Intravenous Every 8 hours 01/24/21 0808     01/23/21 1545  DAPTOmycin (CUBICIN) 450 mg in sodium chloride 0.9 % IVPB        6 mg/kg  71.7 kg (Adjusted) 118 mL/hr over 30 Minutes Intravenous Daily  01/23/21 1459     01/21/21 1515  piperacillin-tazobactam (ZOSYN) IVPB 3.375 g  Status:  Discontinued        3.375 g 12.5 mL/hr over 240 Minutes Intravenous Every 8 hours 01/21/21 1429 01/21/21 1429   01/21/21 1515  piperacillin-tazobactam (ZOSYN) IVPB 3.375 g  Status:  Discontinued        3.375 g 12.5 mL/hr over 240 Minutes Intravenous Every 8 hours 01/21/21 1429 01/23/21 1459   01/19/21 2015  piperacillin-tazobactam (ZOSYN) IVPB 3.375 g        3.375 g 12.5 mL/hr over 240 Minutes Intravenous Every 8 hours 01/19/21 1928 01/21/21 0829   01/19/21 0630  vancomycin (VANCOREADY) IVPB 1250 mg/250 mL  Status:  Discontinued        1,250 mg 166.7 mL/hr over 90 Minutes Intravenous Every 24 hours 01/18/21 1127 01/20/21 1026   01/19/21 0600  cefTRIAXone (ROCEPHIN) 2 g in sodium chloride 0.9 % 100 mL IVPB  Status:  Discontinued        2 g 200 mL/hr over 30 Minutes Intravenous Every 24 hours 01/18/21 1026 01/19/21 1515   01/18/21 0445  vancomycin (VANCOREADY) IVPB 2000 mg/400 mL        2,000 mg 200 mL/hr over  120 Minutes Intravenous  Once 01/18/21 0440 01/18/21 0900   01/18/21 0445  cefTRIAXone (ROCEPHIN) 2 g in sodium chloride 0.9 % 100 mL IVPB        2 g 200 mL/hr over 30 Minutes Intravenous  Once 01/18/21 0440 01/18/21 1610        Assessment/Plan 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 She is high risk for surgical intervention, but if not improving may need to consider. - Will follow closely. Painter for diet today. Continue holding eliquis.   ID - currently zosyn and daptomycin FEN - ok for diet today VTE - continue holding eliquis  Foley - none  HLD GERD Myeloproliferative neoplasm PAF s/p ablation 09/26/2020 -  hold eliquis (last dose 11/4 in AM)   LOS: 6 days    Wellington Hampshire, [address] [facility] Surgery 01/25/2021, 8:12 AM Please see Amion for pager number during day hours 7:00am-4:30pm

## 2021-01-25 NOTE — Plan of Care (Signed)
  Problem: Education: Goal: Knowledge of General Education information will improve Description: Including pain rating scale, medication(s)/side effects and non-pharmacologic comfort measures Outcome: Progressing   Problem: Health Behavior/Discharge Planning: Goal: Ability to manage health-related needs will improve Outcome: Progressing   Problem: Clinical Measurements: Goal: Ability to maintain clinical measurements within normal limits will improve Outcome: Progressing Goal: Will remain free from infection Outcome: Progressing Goal: Diagnostic test results will improve Outcome: Progressing Goal: Respiratory complications will improve Outcome: Progressing Goal: Cardiovascular complication will be avoided Outcome: Progressing   Problem: Activity: Goal: Risk for activity intolerance will decrease Outcome: Progressing   Problem: Nutrition: Goal: Adequate nutrition will be maintained Outcome: Progressing   Problem: Coping: Goal: Level of anxiety will decrease Outcome: Progressing   Problem: Elimination: Goal: Will not experience complications related to bowel motility Outcome: Progressing Goal: Will not experience complications related to urinary retention Outcome: Progressing   Problem: Pain Managment: Goal: General experience of comfort will improve Outcome: Progressing   Problem: Safety: Goal: Ability to remain free from injury will improve Outcome: Progressing   Problem: Skin Integrity: Goal: Risk for impaired skin integrity will decrease Outcome: Progressing   Problem: Education: Goal: Knowledge of General Education information will improve Description: Including pain rating scale, medication(s)/side effects and non-pharmacologic comfort measures Outcome: Progressing   Problem: Health Behavior/Discharge Planning: Goal: Ability to manage health-related needs will improve Outcome: Progressing   Problem: Clinical Measurements: Goal: Ability to maintain  clinical measurements within normal limits will improve Outcome: Progressing Goal: Will remain free from infection Outcome: Progressing Goal: Diagnostic test results will improve Outcome: Progressing Goal: Respiratory complications will improve Outcome: Progressing Goal: Cardiovascular complication will be avoided Outcome: Progressing   Problem: Activity: Goal: Risk for activity intolerance will decrease Outcome: Progressing   Problem: Nutrition: Goal: Adequate nutrition will be maintained Outcome: Progressing   Problem: Coping: Goal: Level of anxiety will decrease Outcome: Progressing   Problem: Elimination: Goal: Will not experience complications related to bowel motility Outcome: Progressing Goal: Will not experience complications related to urinary retention Outcome: Progressing   Problem: Pain Managment: Goal: General experience of comfort will improve Outcome: Progressing   Problem: Safety: Goal: Ability to remain free from injury will improve Outcome: Progressing   Problem: Skin Integrity: Goal: Risk for impaired skin integrity will decrease Outcome: Progressing   Problem: Clinical Measurements: Goal: Ability to avoid or minimize complications of infection will improve Outcome: Progressing   Problem: Skin Integrity: Goal: Skin integrity will improve Outcome: Progressing

## 2021-01-25 NOTE — Progress Notes (Signed)
Mobility Specialist Progress Note:    01/25/21 1456  Mobility  Activity Refused mobility     [facility] Phone [phone]

## 2021-01-26 LAB — CBC
HCT: 33.9 % — ABNORMAL LOW (ref 36.0–46.0)
Hemoglobin: 10.9 g/dL — ABNORMAL LOW (ref 12.0–15.0)
MCH: 29.1 pg (ref 26.0–34.0)
MCHC: 32.2 g/dL (ref 30.0–36.0)
MCV: 90.6 fL (ref 80.0–100.0)
Platelets: 213 10*3/uL (ref 150–400)
RBC: 3.74 MIL/uL — ABNORMAL LOW (ref 3.87–5.11)
RDW: 19.1 % — ABNORMAL HIGH (ref 11.5–15.5)
WBC: 39.8 10*3/uL — ABNORMAL HIGH (ref 4.0–10.5)
nRBC: 0.1 % (ref 0.0–0.2)

## 2021-01-26 LAB — BASIC METABOLIC PANEL
Anion gap: 7 (ref 5–15)
BUN: 6 mg/dL — ABNORMAL LOW (ref 8–23)
CO2: 27 mmol/L (ref 22–32)
Calcium: 7.9 mg/dL — ABNORMAL LOW (ref 8.9–10.3)
Chloride: 101 mmol/L (ref 98–111)
Creatinine, Ser: 0.37 mg/dL — ABNORMAL LOW (ref 0.44–1.00)
GFR, Estimated: >60 mL/min (ref >60)
Glucose, Bld: 95 mg/dL (ref 70–99)
Potassium: 3.5 mmol/L (ref 3.5–5.1)
Sodium: 135 mmol/L (ref 135–145)

## 2021-01-26 NOTE — Progress Notes (Signed)
Patient ID: Savannah Hobbs, female   DOB: [birth year], 75 y.o.   MRN: [id number] [facility] Surgery Progress Note     Subjective: CC-  Continues to feel better. Feels like erythema and swelling at umbilicus is a little better. WBC down 39.8, afebrile Tolerating diet, BM this morning  Objective: Vital signs in last 24 hours: Temp:  [97.8 F (36.6 C)-98.5 F (36.9 C)] 98.5 F (36.9 C) (11/06 0416) Pulse Rate:  [89-102] 90 (11/06 0416) Resp:  [18-19] 19 (11/06 0416) BP: (133-161)/(63-76) 144/76 (11/06 0416) SpO2:  [93 %-98 %] 98 % (11/06 0416) Weight:  [92.6 kg] 92.6 kg (11/06 0323) Last BM Date: 01/25/21  Intake/Output from previous day: 11/05 0701 - 11/06 0700 In: 1290.7 [P.O.:1080; IV Piggyback:210.7] Out: 2500 [Urine:2500] Intake/Output this shift: No intake/output data recorded.  PE: Gen:  Alert, NAD, pleasant Pulm: rate and effort normal Abd: soft, mild focal tenderness at umbilicus, umbilical hernia is firm with extensive cellulitis but does appear to be more faint at the edges compared to yesterday, no purulent drainage    Lab Results:  Recent Labs    01/25/21 0705 01/26/21 0424  WBC 42.8* 39.8*  HGB 10.9* 10.9*  HCT 32.9* 33.9*  PLT 214 213   BMET Recent Labs    01/25/21 0433 01/26/21 0424  NA 134* 135  K 4.2 3.5  CL 100 101  CO2 24 27  GLUCOSE 78 95  BUN 7* 6*  CREATININE 0.41* 0.37*  CALCIUM 8.1* 7.9*   PT/INR Recent Labs    01/25/21 0433  LABPROT 19.9*  INR 1.7*   CMP     Component Value Date/Time   NA 135 01/26/2021 0424   NA 140 09/02/2020 1521   K 3.5 01/26/2021 0424   CL 101 01/26/2021 0424   CO2 27 01/26/2021 0424   GLUCOSE 95 01/26/2021 0424   BUN 6 (L) 01/26/2021 0424   BUN 9 09/02/2020 1521   CREATININE 0.37 (L) 01/26/2021 0424   CALCIUM 7.9 (L) 01/26/2021 0424   PROT 6.4 (L) 01/25/2021 0433   PROT 7.7 07/23/2020 1609   ALBUMIN 2.4 (L) 01/25/2021 0433   ALBUMIN 3.8 07/23/2020 1609   AST 44 (H) 01/25/2021  0433   ALT 16 01/25/2021 0433   ALKPHOS 83 01/25/2021 0433   BILITOT 1.2 01/25/2021 0433   BILITOT 1.0 07/23/2020 1609   GFRNONAA >60 01/26/2021 0424   GFRAA 118 03/05/2020 1629   Lipase     Component Value Date/Time   LIPASE 35 01/17/2021 2225       Studies/Results: CT ABDOMEN PELVIS W CONTRAST  Result Date: 01/24/2021 CLINICAL DATA:  Abdominal pain EXAM: CT ABDOMEN AND PELVIS WITH CONTRAST TECHNIQUE: Multidetector CT imaging of the abdomen and pelvis was performed using the standard protocol following bolus administration of intravenous contrast. CONTRAST:  122mL OMNIPAQUE IOHEXOL 350 MG/ML SOLN COMPARISON:  01/18/2021 FINDINGS: Lower chest: Heart is enlarged in size. Small linear densities seen in the left posterior costophrenic angle suggesting minimal scarring or subsegmental atelectasis. There is interval decrease in left pleural effusion. Hepatobiliary: Liver measures 17.3 cm in length. There is nodularity in the liver surface. No definite focal abnormality is seen in the liver. There is no dilation of bile ducts. There is small calcified gallbladder stone. There is no wall thickening in gallbladder. Pancreas: There is atrophy. Spleen: Spleen measures 17.7 cm in maximum diameter. Splenule is seen along the medial margin of spleen. There are tortuous vessels in the splenic hilum, possibly suggesting portal hypertension. Adrenals/Urinary  Tract: Adrenals are not enlarged. There is no hydronephrosis. There are 2 small calculi in the lower pole of left kidney. There are no demonstrable ureteral stones. Urinary bladder is not distended. Stomach/Bowel: Stomach is unremarkable. Small bowel loops are not dilated. Appendix is not distinctly seen. There is no pericecal inflammation. There is no significant wall thickening in colon. Vascular/Lymphatic: There is recanalization of umbilical vein. There are tortuous vessels in the upper abdomen and anterior abdominal wall suggesting portosystemic shunt  due to portal hypertension. There are scattered atherosclerotic plaques and calcifications in the aorta and its major branches. There are slightly enlarged lymph nodes in the retroperitoneum with no significant change. Reproductive: Uterus is not seen.  There are no adnexal masses. Other: Small ascites is present. There is no pneumoperitoneum. There is diffuse edema in the abdominal wall, especially in the left mid and lower abdomen. Umbilical hernia containing fat is seen. There is 6.2 cm loculated fluid collection in the left paraumbilical region with no significant change. There are no pockets of air within this fluid collection. Musculoskeletal: Degenerative changes are noted in the lower lumbar spine with encroachment of neural foramina and mild spinal stenosis. IMPRESSION: There is no evidence of intestinal obstruction or pneumoperitoneum. There is no hydronephrosis. Cirrhosis of liver. Small ascites. Portal hypertension. Splenomegaly. Gallbladder stone. Small left renal stones. There is 6.2 cm loculated fluid collection in the left paraumbilical region with no significant interval change. Differential diagnostic possibilities would include entrapment of ascitic fluid in the paraumbilical hernia or resolving hematoma or an abscess. There is edema in subcutaneous plane, especially in the left lower and left mid abdomen suggesting contusion or cellulitis. There is slight worsening of this finding. Electronically Signed   By: Elmer Picker M.D.   On: 01/24/2021 14:04    Anti-infectives: Anti-infectives (From admission, onward)    Start     Dose/Rate Route Frequency Ordered Stop   01/24/21 0900  piperacillin-tazobactam (ZOSYN) IVPB 3.375 g        3.375 g 12.5 mL/hr over 240 Minutes Intravenous Every 8 hours 01/24/21 0808     01/23/21 1545  DAPTOmycin (CUBICIN) 450 mg in sodium chloride 0.9 % IVPB        6 mg/kg  71.7 kg (Adjusted) 118 mL/hr over 30 Minutes Intravenous Daily 01/23/21 1459      01/21/21 1515  piperacillin-tazobactam (ZOSYN) IVPB 3.375 g  Status:  Discontinued        3.375 g 12.5 mL/hr over 240 Minutes Intravenous Every 8 hours 01/21/21 1429 01/21/21 1429   01/21/21 1515  piperacillin-tazobactam (ZOSYN) IVPB 3.375 g  Status:  Discontinued        3.375 g 12.5 mL/hr over 240 Minutes Intravenous Every 8 hours 01/21/21 1429 01/23/21 1459   01/19/21 2015  piperacillin-tazobactam (ZOSYN) IVPB 3.375 g        3.375 g 12.5 mL/hr over 240 Minutes Intravenous Every 8 hours 01/19/21 1928 01/21/21 0829   01/19/21 0630  vancomycin (VANCOREADY) IVPB 1250 mg/250 mL  Status:  Discontinued        1,250 mg 166.7 mL/hr over 90 Minutes Intravenous Every 24 hours 01/18/21 1127 01/20/21 1026   01/19/21 0600  cefTRIAXone (ROCEPHIN) 2 g in sodium chloride 0.9 % 100 mL IVPB  Status:  Discontinued        2 g 200 mL/hr over 30 Minutes Intravenous Every 24 hours 01/18/21 1026 01/19/21 1515   01/18/21 0445  vancomycin (VANCOREADY) IVPB 2000 mg/400 mL  2,000 mg 200 mL/hr over 120 Minutes Intravenous  Once 01/18/21 0440 01/18/21 0900   01/18/21 0445  cefTRIAXone (ROCEPHIN) 2 g in sodium chloride 0.9 % 100 mL IVPB        2 g 200 mL/hr over 30 Minutes Intravenous  Once 01/18/21 0440 01/18/21 9847        Assessment/Plan 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 Will monitor closely. Continue abx per ID. She is high risk for surgical intervention, but if not improving may need to consider. - Continue holding eliquis.    ID - currently zosyn and daptomycin FEN - HH diet VTE - continue holding eliquis  Foley - none    HLD GERD Myeloproliferative neoplasm PAF s/p ablation 09/26/2020 - hold eliquis (last dose 11/4 in AM)   LOS: 7 days    Wellington Hampshire, [facility] Surgery 01/26/2021, 7:53 AM Please see Amion for pager number during day hours 7:00am-4:30pm

## 2021-01-26 NOTE — Plan of Care (Signed)

## 2021-01-26 NOTE — Plan of Care (Signed)
  Problem: Education: Goal: Knowledge of General Education information will improve Description: Including pain rating scale, medication(s)/side effects and non-pharmacologic comfort measures Outcome: Progressing   Problem: Health Behavior/Discharge Planning: Goal: Ability to manage health-related needs will improve Outcome: Progressing   Problem: Clinical Measurements: Goal: Ability to maintain clinical measurements within normal limits will improve Outcome: Progressing Goal: Will remain free from infection Outcome: Progressing Goal: Diagnostic test results will improve Outcome: Progressing Goal: Respiratory complications will improve Outcome: Progressing Goal: Cardiovascular complication will be avoided Outcome: Progressing   Problem: Activity: Goal: Risk for activity intolerance will decrease Outcome: Progressing   Problem: Nutrition: Goal: Adequate nutrition will be maintained Outcome: Progressing   Problem: Coping: Goal: Level of anxiety will decrease Outcome: Progressing   Problem: Elimination: Goal: Will not experience complications related to bowel motility Outcome: Progressing Goal: Will not experience complications related to urinary retention Outcome: Progressing   Problem: Pain Managment: Goal: General experience of comfort will improve Outcome: Progressing   Problem: Safety: Goal: Ability to remain free from injury will improve Outcome: Progressing   Problem: Skin Integrity: Goal: Risk for impaired skin integrity will decrease Outcome: Progressing   Problem: Education: Goal: Knowledge of General Education information will improve Description: Including pain rating scale, medication(s)/side effects and non-pharmacologic comfort measures Outcome: Progressing   Problem: Health Behavior/Discharge Planning: Goal: Ability to manage health-related needs will improve Outcome: Progressing   Problem: Clinical Measurements: Goal: Ability to maintain  clinical measurements within normal limits will improve Outcome: Progressing Goal: Will remain free from infection Outcome: Progressing Goal: Diagnostic test results will improve Outcome: Progressing Goal: Respiratory complications will improve Outcome: Progressing Goal: Cardiovascular complication will be avoided Outcome: Progressing   Problem: Activity: Goal: Risk for activity intolerance will decrease Outcome: Progressing   Problem: Nutrition: Goal: Adequate nutrition will be maintained Outcome: Progressing   Problem: Coping: Goal: Level of anxiety will decrease Outcome: Progressing   Problem: Elimination: Goal: Will not experience complications related to bowel motility Outcome: Progressing Goal: Will not experience complications related to urinary retention Outcome: Progressing   Problem: Pain Managment: Goal: General experience of comfort will improve Outcome: Progressing   Problem: Safety: Goal: Ability to remain free from injury will improve Outcome: Progressing   Problem: Skin Integrity: Goal: Risk for impaired skin integrity will decrease Outcome: Progressing   Problem: Clinical Measurements: Goal: Ability to avoid or minimize complications of infection will improve Outcome: Progressing   Problem: Skin Integrity: Goal: Skin integrity will improve Outcome: Progressing

## 2021-01-26 NOTE — Progress Notes (Signed)
Nutrition Education Note RD working remotely.   RD consulted for low sodium diet of <2 g/day.   RD placed "Low Sodium Nutrition Therapy" handout from the Academy of [employer] in patient's Discharge Instructions/AVS.  Reviewed patient's dietary recall. Provided examples on ways to decrease sodium intake in diet. Discouraged intake of processed foods and use of salt shaker. Encouraged fresh fruits and vegetables as well as whole grain sources of carbohydrates to maximize fiber intake.   RD discussed why it is important for patient to adhere to diet recommendations, and emphasized the role of fluids, foods to avoid, and importance of weighing self daily. Teach back method used.  Consult also requests patient to consume 1-1.5 grams/kg protein/day. This would equate to 93-140 grams protein/day. Will add handout to Discharge Instructions/AVS for High Protein foods list.    Expect good to fair compliance.  Body mass index is 33.96 kg/m. Pt meets criteria for obesity based on current BMI.  Current diet order is 2 gram Na, patient is consuming approximately 75% of meals at this time (25-100%).   Labs and medications reviewed. No further nutrition interventions warranted at this time. RD contact information provided. If additional nutrition issues arise, please re-consult RD.      Jarome Matin, MS, RD, LDN, CNSC Inpatient Clinical Dietitian RD pager # available in [place]  After hours/weekend pager # available in [facility]

## 2021-01-26 NOTE — Progress Notes (Signed)
HD#7 Subjective:  Overnight Events: No events  Patient is seen at bedside.  She is walking with a Rollator.  States that she is feeling better today.  States that the swelling and pain have improved.  She had a bowel movement yesterday.    Objective:  Vital signs in last 24 hours: Vitals:   01/25/21 1504 01/25/21 2026 01/26/21 0323 01/26/21 0416  BP: 133/63 (!) 143/73  (!) 144/76  Pulse: 89 94  90  Resp:  18  19  Temp: 98.1 F (36.7 C) 97.8 F (36.6 C)  98.5 F (36.9 C)  TempSrc: Oral Oral  Oral  SpO2: 93% 94%  98%  Weight:   92.6 kg   Height:       Supplemental O2: Room Air SpO2: 98 %   Physical Exam:  Physical Exam Constitutional:      General: She is not in acute distress.    Appearance: She is not ill-appearing.  HENT:     Head: Normocephalic.  Pulmonary:     Effort: Pulmonary effort is normal. No respiratory distress.  Abdominal:     General: Bowel sounds are normal. There is distension.     Tenderness: There is abdominal tenderness.     Comments: Similar erythema across the lower abdomen.  Mild tenderness to palpation.  Skin:    General: Skin is warm.  Neurological:     Mental Status: She is alert and oriented to person, place, and time.  Psychiatric:        Mood and Affect: Mood normal.        Behavior: Behavior normal.    Filed Weights   01/24/21 0412 01/25/21 0540 01/26/21 0323  Weight: 94.4 kg 93.7 kg 92.6 kg     Intake/Output Summary (Last 24 hours) at 01/26/2021 0647 Last data filed at 01/26/2021 0086 Gross per 24 hour  Intake 1290.7 ml  Output 2500 ml  Net -1209.3 ml   Net IO Since Admission: -864 mL [01/26/21 0647]  Pertinent Labs: CBC Latest Ref Rng & Units 01/26/2021 01/25/2021 01/24/2021  WBC 4.0 - 10.5 K/uL 39.8(H) 42.8(H) 44.6(H)  Hemoglobin 12.0 - 15.0 g/dL 10.9(L) 10.9(L) 11.7(L)  Hematocrit 36.0 - 46.0 % 33.9(L) 32.9(L) 36.7  Platelets 150 - 400 K/uL 213 214 214    CMP Latest Ref Rng & Units 01/26/2021 01/25/2021 01/24/2021   Glucose 70 - 99 mg/dL 95 78 96  BUN 8 - 23 mg/dL 6(L) 7(L) 5(L)  Creatinine 0.44 - 1.00 mg/dL 0.37(L) 0.41(L) 0.45  Sodium 135 - 145 mmol/L 135 134(L) 134(L)  Potassium 3.5 - 5.1 mmol/L 3.5 4.2 3.9  Chloride 98 - 111 mmol/L 101 100 102  CO2 22 - 32 mmol/L 27 24 28   Calcium 8.9 - 10.3 mg/dL 7.9(L) 8.1(L) 8.5(L)  Total Protein 6.5 - 8.1 g/dL - 6.4(L) -  Total Bilirubin 0.3 - 1.2 mg/dL - 1.2 -  Alkaline Phos 38 - 126 U/L - 83 -  AST 15 - 41 U/L - 44(H) -  ALT 0 - 44 U/L - 16 -    Imaging: No results found.  Assessment/Plan:   Active Problems:   Abdominal wall cellulitis   Periumbilical hernia   Other ascites   Periumbilical abdominal pain   NASH (nonalcoholic steatohepatitis)   Myeloproliferative disease ([place])   Thrombocytopenia ([place])   Patient Summary: Savannah Hobbs is a 75 y.o. with pertinent PMH of PAF, Hiatal hernia, cirrhosis, and GERD who presented with abdominal pain and admit for treatment of abdominal  wall cellulitis secondary to strangulated umbilical hernia.  Abdominal wall cellulitis secondary to strangulated umbilical hernia Similar appearance and erythema of the abdominal wall on physical exam today.  White blood count trend down from 42 to 39.  She had a bowel meant yesterday.  Patient and family are aware of the risk of of her abdominal operation in the setting of her underlying cirrhosis and ascites.  After family discussion yesterday, they would like to wait 3 days before making the final decision of whether to proceed with surgical intervention. -Appreciate surgery recommendation -Continue IV daptomycin and Zosyn -Pain is well controlled with scheduled Tylenol and as needed oxycodone -Bowel regimen in place   Cirrhosis with Portal Hypertension Thrombocytopenia In the setting of medication use for knee pain. Confirmed by Dr. Carlean Purl in 2018.  Volume status stable today -Continue lasix 60 mg daily  -Continue spironolactone 150 mg daily  Paroxysmal  Atrial Fibrillation S/P Ablation 09/26/2020 CHA2DS2-VASc score of 2, on Eliquis 5 mg twice daily and flecainide 200 mg as needed daily.  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  Not currently on treatment, monitored by her oncologist.   Full code IV fluid: None DVT: Holding Eliquis for anticipation of surgery.  Patient is ambulating in the room.  Do not need SCDs at this time. Diet: Heart  Prior to Admission Living Arrangement: Home Anticipated Discharge Location: Home Barriers to Discharge: Further medical treatment Dispo: Pending decision for surgical operation  Gaylan Gerold, DO 01/26/2021, 6:47 AM Pager: [phone]  Please contact the on call pager after 5 pm and on weekends at [phone].

## 2021-01-27 DIAGNOSIS — L03311 Cellulitis of abdominal wall: Secondary | ICD-10-CM | POA: Diagnosis not present

## 2021-01-27 DIAGNOSIS — D696 Thrombocytopenia, unspecified: Secondary | ICD-10-CM | POA: Diagnosis not present

## 2021-01-27 DIAGNOSIS — K429 Umbilical hernia without obstruction or gangrene: Secondary | ICD-10-CM | POA: Diagnosis not present

## 2021-01-27 DIAGNOSIS — K7290 Hepatic failure, unspecified without coma: Secondary | ICD-10-CM

## 2021-01-27 DIAGNOSIS — K7460 Unspecified cirrhosis of liver: Secondary | ICD-10-CM | POA: Diagnosis not present

## 2021-01-27 LAB — CBC
HCT: 33.1 % — ABNORMAL LOW (ref 36.0–46.0)
Hemoglobin: 10.7 g/dL — ABNORMAL LOW (ref 12.0–15.0)
MCH: 29.2 pg (ref 26.0–34.0)
MCHC: 32.3 g/dL (ref 30.0–36.0)
MCV: 90.2 fL (ref 80.0–100.0)
Platelets: 226 K/uL (ref 150–400)
RBC: 3.67 MIL/uL — ABNORMAL LOW (ref 3.87–5.11)
RDW: 19.4 % — ABNORMAL HIGH (ref 11.5–15.5)
WBC: 39.8 K/uL — ABNORMAL HIGH (ref 4.0–10.5)
nRBC: 0.1 % (ref 0.0–0.2)

## 2021-01-27 LAB — BASIC METABOLIC PANEL WITH GFR
Anion gap: 7 (ref 5–15)
BUN: 5 mg/dL — ABNORMAL LOW (ref 8–23)
CO2: 28 mmol/L (ref 22–32)
Calcium: 8.2 mg/dL — ABNORMAL LOW (ref 8.9–10.3)
Chloride: 101 mmol/L (ref 98–111)
Creatinine, Ser: 0.45 mg/dL (ref 0.44–1.00)
GFR, Estimated: >60 mL/min
Glucose, Bld: 98 mg/dL (ref 70–99)
Potassium: 3.6 mmol/L (ref 3.5–5.1)
Sodium: 136 mmol/L (ref 135–145)

## 2021-01-27 NOTE — Care Management Important Message (Signed)
Important Message  Patient Details  Name: Savannah Hobbs MRN: [id number] Date of Birth: 1946-01-23   Medicare Important Message Given:  Yes     [name] 01/27/2021, 10:17 AM

## 2021-01-27 NOTE — Progress Notes (Signed)
Patient ID: Savannah Hobbs, female   DOB: [birth year], 75 y.o.   MRN: [id number]     Subjective: Some GI upsat after breakfast. Reports her cellulitis is slightly better. ROS negative except as listed above. Objective: Vital signs in last 24 hours: Temp:  [98.6 F (37 C)-98.8 F (37.1 C)] 98.6 F (37 C) (11/07 0928) Pulse Rate:  [84-105] 99 (11/07 0928) Resp:  [16-22] 16 (11/07 0928) BP: (142-164)/(64-74) 164/64 (11/07 0928) SpO2:  [92 %-96 %] 94 % (11/07 0928) Weight:  [91.5 kg] 91.5 kg (11/07 0236) Last BM Date: 01/26/21  Intake/Output from previous day: 11/06 0701 - 11/07 0700 In: 324.1 [P.O.:120; IV Piggyback:204.1] Out: 2700 [Urine:2700] Intake/Output this shift: No intake/output data recorded.  General appearance: cooperative Resp: clear to auscultation bilaterally Cardio: regular rate and rhythm GI: soft, UH and periumbilical cellulitis slightly improoved R side Extremities: calves soft  Lab Results: CBC  Recent Labs    01/26/21 0424 01/27/21 0043  WBC 39.8* 39.8*  HGB 10.9* 10.7*  HCT 33.9* 33.1*  PLT 213 226   BMET Recent Labs    01/26/21 0424 01/27/21 0043  NA 135 136  K 3.5 3.6  CL 101 101  CO2 27 28  GLUCOSE 95 98  BUN 6* 5*  CREATININE 0.37* 0.45  CALCIUM 7.9* 8.2*   PT/INR Recent Labs    01/25/21 0433  LABPROT 19.9*  INR 1.7*   ABG No results for input(s): PHART, HCO3 in the last 72 hours.  Invalid input(s): PCO2, PO2  Studies/Results: No results found.  Anti-infectives: Anti-infectives (From admission, onward)    Start     Dose/Rate Route Frequency Ordered Stop   01/24/21 0900  piperacillin-tazobactam (ZOSYN) IVPB 3.375 g        3.375 g 12.5 mL/hr over 240 Minutes Intravenous Every 8 hours 01/24/21 0808     01/23/21 1545  DAPTOmycin (CUBICIN) 450 mg in sodium chloride 0.9 % IVPB        6 mg/kg  71.7 kg (Adjusted) 118 mL/hr over 30 Minutes Intravenous Daily 01/23/21 1459     01/21/21 1515  piperacillin-tazobactam  (ZOSYN) IVPB 3.375 g  Status:  Discontinued        3.375 g 12.5 mL/hr over 240 Minutes Intravenous Every 8 hours 01/21/21 1429 01/21/21 1429   01/21/21 1515  piperacillin-tazobactam (ZOSYN) IVPB 3.375 g  Status:  Discontinued        3.375 g 12.5 mL/hr over 240 Minutes Intravenous Every 8 hours 01/21/21 1429 01/23/21 1459   01/19/21 2015  piperacillin-tazobactam (ZOSYN) IVPB 3.375 g        3.375 g 12.5 mL/hr over 240 Minutes Intravenous Every 8 hours 01/19/21 1928 01/21/21 0829   01/19/21 0630  vancomycin (VANCOREADY) IVPB 1250 mg/250 mL  Status:  Discontinued        1,250 mg 166.7 mL/hr over 90 Minutes Intravenous Every 24 hours 01/18/21 1127 01/20/21 1026   01/19/21 0600  cefTRIAXone (ROCEPHIN) 2 g in sodium chloride 0.9 % 100 mL IVPB  Status:  Discontinued        2 g 200 mL/hr over 30 Minutes Intravenous Every 24 hours 01/18/21 1026 01/19/21 1515   01/18/21 0445  vancomycin (VANCOREADY) IVPB 2000 mg/400 mL        2,000 mg 200 mL/hr over 120 Minutes Intravenous  Once 01/18/21 0440 01/18/21 0900   01/18/21 0445  cefTRIAXone (ROCEPHIN) 2 g in sodium chloride 0.9 % 100 mL IVPB        2 g 200 mL/hr  over 30 Minutes Intravenous  Once 01/18/21 0440 01/18/21 8182       Assessment/Plan: 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    ID - currently zosyn and daptomycin FEN - HH diet VTE - continue holding eliquis  Foley - none   HLD GERD Myeloproliferative neoplasm PAF s/p ablation 09/26/2020 - hold eliquis (last dose 11/4 in AM)    LOS: 8 days    Georganna Skeans, MD, MPH, FACS Trauma & General Surgery Use [email or web address] to contact  on call provider  01/27/2021

## 2021-01-27 NOTE — Progress Notes (Signed)
Pharmacy Antibiotic Note- Follow-Up  Savannah Hobbs is a 75 y.o. female admitted on 01/17/2021 with strangulated piece of omentum in her hernia, no surgery is planned. She also has abdominal wall cellulitis.  Pharmacy consulted for daptomycin and zosyn dosing.  Good renal function ClCr ~71 ml/min. WBC 25> up to 44.6 > 39.8. Afebrile.   Plan:  Zosyn 3.375g IV q 8 hrs (extended interval infusion) Daptomycin 6mg  /kg q24h of (ABW)   Hold atorvastatin for now Check CK on fridays  01/24/2021- CK 39   Height: 5\' 5"  (165.1 cm) Weight: 91.5 kg (201 lb 12.8 oz) IBW/kg (Calculated) : 57  Temp (24hrs), Avg:98.7 F (37.1 C), Min:98.6 F (37 C), Max:98.8 F (37.1 C)  Recent Labs  Lab 01/23/21 0403 01/24/21 0331 01/25/21 0433 01/25/21 0705 01/26/21 0424 01/27/21 0043  WBC 39.0* 44.6*  --  42.8* 39.8* 39.8*  CREATININE 0.45 0.45 0.41*  --  0.37* 0.45    Estimated Creatinine Clearance: 71 mL/min (by C-G formula based on SCr of 0.45 mg/dL).    Allergies  Allergen Reactions   Shellfish Allergy Hives    Antimicrobials this admission: Vanc 10/29 >> 10/31 Ceftriaxone 10/29 >> 10/30 Pip/tazp 10/30 >>  Dapto 11/3 >>  Dose adjustments this admission: N/A  Microbiology results: 10/29 BCx: ngtd at 5 days 10/29 MRSA PCR: ng   Savannah Hobbs BS, PharmD, BCPS Clinical Pharmacist   01/27/2021 8:13 AM   [facility] pharmacy phone numbers are listed on [email or web address]

## 2021-01-27 NOTE — Progress Notes (Addendum)
[facility] Infectious Diseases Follow Up Note  Patient Identification: Patient Name: Savannah Hobbs MRN: [id number] [place] Date: 01/17/2021  9:38 PM Age: 75 y.o.Today's Date: 01/27/2021   Reason for Visit: inacarcerated umbilical hernia   Active Problems:   Abdominal wall cellulitis   Periumbilical hernia   Other ascites   Periumbilical abdominal pain   NASH (nonalcoholic steatohepatitis)   Myeloproliferative disease (HCC)   Thrombocytopenia (HCC)   Antibiotics: Ceftriaxone 10/28-10/29                    Vancomycin 10/28-10/30; Daptomycin 11/3-current                     Pip/tazo 10/30-current   Lines/Hardwares: None   Interval Events: afebrile, WBC stabilized at 39. No new cultures available    Assessment Incarcerated umbilical hernia containing omentum Abdominal wall cellulitis  NASH Cirrhosis/Portal HTN/Splenomegaly and Ascites MDS Morbid Obesity  H/o COVID OA s/p B/L TKR Leukocytosis in the setting of MDS   Recommendations Continue IV daptomycin and IV zosyn as is. Considered de-escalation of zosyn to unasyn but will continue on same abtx for now while she is undergoing 3 day abtx trial. My exam today is similar to the picture of her abdominal wall taken yesterday  Fu surgery recs Monitor CBC, CMP and CPK  Following   Rest of the management as per the primary team. Thank you for the consult. Please page with pertinent questions or concerns.  ______________________________________________________________________ Subjective patient seen and examined at the bedside.  Complains of pain at the abdominal wall cellulitic area and also in the left lower quadrant. She was able to eat 1/3 of her whole food. Denies any nausea, vomiting, had BM yesterday and feels like she is having BM at the time of my exam    Vitals BP (!) 144/66 (BP Location: Left Wrist)   Pulse 84   Temp 98.8 F (37.1 C) (Oral)   Resp 18    Ht 5\' 5"  (1.651 m)   Wt 91.5 kg   SpO2 92%   BMI 33.58 kg/m     Physical Exam Constitutional:  Not in acute distress, obese     Comments:   Cardiovascular:     Rate and Rhythm: Normal rate and regular rhythm.     Heart sounds:   Pulmonary:     Effort: Pulmonary effort is normal.     Comments:   Abdominal: Tenderness in the mid abdomen and left lower quadrant, surrounding erythema /warmth around the umbilicus with central indurated umbilical region   Musculoskeletal:        General: No swelling or tenderness. B/l TKR  Skin:    Comments: as above   Neurological:     General: No focal deficit present.   Psychiatric:        Mood and Affect: Mood normal.    Pertinent Microbiology Results for orders placed or performed during the hospital encounter of 01/17/21  Resp Panel by RT-PCR (Flu A&B, Covid) Nasopharyngeal Swab     Status: None   Collection Time: 01/18/21  4:40 AM   Specimen: Nasopharyngeal Swab; Nasopharyngeal(NP) swabs in vial transport medium  Result Value Ref Range Status   SARS Coronavirus 2 by RT PCR NEGATIVE NEGATIVE Final    Comment: (NOTE) SARS-CoV-2 target nucleic acids are NOT DETECTED.  The SARS-CoV-2 RNA is generally detectable in upper respiratory specimens during the acute phase of infection. The lowest concentration of SARS-CoV-2 viral copies this assay can detect  is 138 copies/mL. A negative result does not preclude SARS-Cov-2 infection and should not be used as the sole basis for treatment or other patient management decisions. A negative result may occur with  improper specimen collection/handling, submission of specimen other than nasopharyngeal swab, presence of viral mutation(s) within the areas targeted by this assay, and inadequate number of viral copies(<138 copies/mL). A negative result must be combined with clinical observations, patient history, and epidemiological information. The expected result is Negative.  Fact Sheet for  Patients:  EntrepreneurPulse.com.au  Fact Sheet for Healthcare Providers:  IncredibleEmployment.be  This test is no t yet approved or cleared by the [place] FDA and  has been authorized for detection and/or diagnosis of SARS-CoV-2 by FDA under an Emergency Use Authorization (EUA). This EUA will remain  in effect (meaning this test can be used) for the duration of the COVID-19 declaration under Section 564(b)(1) of the Act, 21 U.S.C.section 360bbb-3(b)(1), unless the authorization is terminated  or revoked sooner.       Influenza A by PCR NEGATIVE NEGATIVE Final   Influenza B by PCR NEGATIVE NEGATIVE Final    Comment: (NOTE) The Xpert Xpress SARS-CoV-2/FLU/RSV plus assay is intended as an aid in the diagnosis of influenza from Nasopharyngeal swab specimens and should not be used as a sole basis for treatment. Nasal washings and aspirates are unacceptable for Xpert Xpress SARS-CoV-2/FLU/RSV testing.  Fact Sheet for Patients: EntrepreneurPulse.com.au  Fact Sheet for Healthcare Providers: IncredibleEmployment.be  This test is not yet approved or cleared by the [place] FDA and has been authorized for detection and/or diagnosis of SARS-CoV-2 by FDA under an Emergency Use Authorization (EUA). This EUA will remain in effect (meaning this test can be used) for the duration of the COVID-19 declaration under Section 564(b)(1) of the Act, 21 U.S.C. section 360bbb-3(b)(1), unless the authorization is terminated or revoked.  Performed at [facility], [place] [address], [place], [place] [address]   Culture, blood (single)     Status: None   Collection Time: 01/18/21  4:40 AM   Specimen: BLOOD LEFT HAND  Result Value Ref Range Status   Specimen Description BLOOD LEFT HAND  Final   Special Requests   Final    BOTTLES DRAWN AEROBIC AND ANAEROBIC Blood Culture adequate volume   Culture   Final    NO  GROWTH 5 DAYS Performed at [facility], [place] [address] [place], [place] [address]    Report Status 01/23/2021 FINAL  Final  MRSA Next Gen by PCR, Nasal     Status: None   Collection Time: 01/18/21 10:25 AM   Specimen: Nasal Mucosa; Nasal Swab  Result Value Ref Range Status   MRSA by PCR Next Gen NOT DETECTED NOT DETECTED Final    Comment: (NOTE) The GeneXpert MRSA Assay (FDA approved for NASAL specimens only), is one component of a comprehensive MRSA colonization surveillance program. It is not intended to diagnose MRSA infection nor to guide or monitor treatment for MRSA infections. Test performance is not FDA approved in patients less than 69 years old. Performed at [facility], [place] [address], [place], [place] [address]     Pertinent Lab. CBC Latest Ref Rng & Units 01/27/2021 01/26/2021 01/25/2021  WBC 4.0 - 10.5 K/uL 39.8(H) 39.8(H) 42.8(H)  Hemoglobin 12.0 - 15.0 g/dL 10.7(L) 10.9(L) 10.9(L)  Hematocrit 36.0 - 46.0 % 33.1(L) 33.9(L) 32.9(L)  Platelets 150 - 400 K/uL 226 213 214   CMP Latest Ref Rng & Units  01/27/2021 01/26/2021 01/25/2021  Glucose 70 - 99 mg/dL 98 95 78  BUN 8 - 23 mg/dL 5(L) 6(L) 7(L)  Creatinine 0.44 - 1.00 mg/dL 0.45 0.37(L) 0.41(L)  Sodium 135 - 145 mmol/L 136 135 134(L)  Potassium 3.5 - 5.1 mmol/L 3.6 3.5 4.2  Chloride 98 - 111 mmol/L 101 101 100  CO2 22 - 32 mmol/L 28 27 24   Calcium 8.9 - 10.3 mg/dL 8.2(L) 7.9(L) 8.1(L)  Total Protein 6.5 - 8.1 g/dL - - 6.4(L)  Total Bilirubin 0.3 - 1.2 mg/dL - - 1.2  Alkaline Phos 38 - 126 U/L - - 83  AST 15 - 41 U/L - - 44(H)  ALT 0 - 44 U/L - - 16     Pertinent Imaging today Plain films and CT images have been personally visualized and interpreted; radiology reports have been reviewed. Decision making incorporated into the Impression / Recommendations.  CT ABDOMEN PELVIS WO CONTRAST  Result Date: 01/18/2021 CLINICAL DATA:  Abdominal pain with hernia suspected EXAM: CT ABDOMEN AND PELVIS WITHOUT  CONTRAST TECHNIQUE: Multidetector CT imaging of the abdomen and pelvis was performed following the standard protocol without IV contrast. COMPARISON:  None. FINDINGS: Lower chest:  Borderline septal thickening at the bases. Hepatobiliary: Cirrhotic liver morphology with recanalized umbilical vein and splenomegaly.Cholelithiasis. No evidence of cholecystitis. Pancreas: Generalized atrophy Spleen: Enlarged with 22 cm craniocaudal span. Adrenals/Urinary Tract: Negative adrenals. No hydronephrosis or ureteral calculi. Two punctate left renal calculi. Depressed left kidney due to splenomegaly. Unremarkable bladder. Stomach/Bowel:  No obstruction. No bowel wall thickening. Vascular/Lymphatic: Varices in the upper abdomen. Atheromatous calcification of the aorta and branch vessels Reproductive:Hysterectomy. Other: Midline hernia containing ascitic fluid and congested fat. Diffuse subcutaneous edema of the anterior abdominal wall. Musculoskeletal: Osteopenia and lumbar spine degeneration. No emergent finding. IMPRESSION: 1. Large periumbilical hernia containing congested fat and ascitic fluid. 2. Anterior abdominal wall edema suggesting cellulitis. 3. Cirrhosis with portal hypertension. 4. Cholelithiasis and left nephrolithiasis. Electronically Signed   By: Jorje Guild M.D.   On: 01/18/2021 08:19   CT ABDOMEN PELVIS W CONTRAST  Result Date: 01/24/2021 CLINICAL DATA:  Abdominal pain EXAM: CT ABDOMEN AND PELVIS WITH CONTRAST TECHNIQUE: Multidetector CT imaging of the abdomen and pelvis was performed using the standard protocol following bolus administration of intravenous contrast. CONTRAST:  124mL OMNIPAQUE IOHEXOL 350 MG/ML SOLN COMPARISON:  01/18/2021 FINDINGS: Lower chest: Heart is enlarged in size. Small linear densities seen in the left posterior costophrenic angle suggesting minimal scarring or subsegmental atelectasis. There is interval decrease in left pleural effusion. Hepatobiliary: Liver measures 17.3 cm  in length. There is nodularity in the liver surface. No definite focal abnormality is seen in the liver. There is no dilation of bile ducts. There is small calcified gallbladder stone. There is no wall thickening in gallbladder. Pancreas: There is atrophy. Spleen: Spleen measures 17.7 cm in maximum diameter. Splenule is seen along the medial margin of spleen. There are tortuous vessels in the splenic hilum, possibly suggesting portal hypertension. Adrenals/Urinary Tract: Adrenals are not enlarged. There is no hydronephrosis. There are 2 small calculi in the lower pole of left kidney. There are no demonstrable ureteral stones. Urinary bladder is not distended. Stomach/Bowel: Stomach is unremarkable. Small bowel loops are not dilated. Appendix is not distinctly seen. There is no pericecal inflammation. There is no significant wall thickening in colon. Vascular/Lymphatic: There is recanalization of umbilical vein. There are tortuous vessels in the upper abdomen and anterior abdominal wall suggesting portosystemic shunt due to portal hypertension. There  are scattered atherosclerotic plaques and calcifications in the aorta and its major branches. There are slightly enlarged lymph nodes in the retroperitoneum with no significant change. Reproductive: Uterus is not seen.  There are no adnexal masses. Other: Small ascites is present. There is no pneumoperitoneum. There is diffuse edema in the abdominal wall, especially in the left mid and lower abdomen. Umbilical hernia containing fat is seen. There is 6.2 cm loculated fluid collection in the left paraumbilical region with no significant change. There are no pockets of air within this fluid collection. Musculoskeletal: Degenerative changes are noted in the lower lumbar spine with encroachment of neural foramina and mild spinal stenosis. IMPRESSION: There is no evidence of intestinal obstruction or pneumoperitoneum. There is no hydronephrosis. Cirrhosis of liver. Small  ascites. Portal hypertension. Splenomegaly. Gallbladder stone. Small left renal stones. There is 6.2 cm loculated fluid collection in the left paraumbilical region with no significant interval change. Differential diagnostic possibilities would include entrapment of ascitic fluid in the paraumbilical hernia or resolving hematoma or an abscess. There is edema in subcutaneous plane, especially in the left lower and left mid abdomen suggesting contusion or cellulitis. There is slight worsening of this finding. Electronically Signed   By: Elmer Picker M.D.   On: 01/24/2021 14:04     I spent more than 45  minutes for this patient encounter including review of prior medical records, coordination of care  with greater than 50% of time being face to face/counseling and discussing diagnostics/treatment plan with the patient/family.  Electronically signed by:   Rosiland Oz, MD Infectious Disease Physician [facility] Pager: [phone]

## 2021-01-27 NOTE — Progress Notes (Signed)
Mobility Specialist Progress Note:   01/27/21 1454  Mobility  Activity Ambulated in hall  Level of Assistance Standby assist, set-up cues, supervision of patient - no hands on  Assistive Device Front wheel walker  Distance Ambulated (ft) 450 ft  Mobility Ambulated with assistance in hallway  Mobility Response Tolerated well  Mobility performed by Mobility specialist  $Mobility charge 1 Mobility   Pt received in bed willing to participate in mobility. No complaints of pain. Pt returned to EOB with call bell in reach and all needs met.   [facility] Health and safety inspector Phone [phone]

## 2021-01-27 NOTE — Progress Notes (Signed)
HD#8 Subjective:  Overnight Events: No events  Patient is seen at bedside.  Patient states that she feels better after a BM this AM.  Patient reports that her nausea and abdominal pain have improved but still endorsing occasional shooting pain when moving around.  Patient feels that her abdomen has reduced in swelling and mildly and erythema.  Discussed plan with patient to continue to support and monitor her while she and her family make the determination whether they will proceed with surgery tomorrow or not.    Objective:  Vital signs in last 24 hours: Vitals:   01/27/21 0236 01/27/21 0245 01/27/21 0928 01/27/21 1138  BP:  (!) 144/66 (!) 164/64 (!) 150/73  Pulse:  84 99 91  Resp:  18 16 19   Temp:  98.8 F (37.1 C) 98.6 F (37 C) 98.1 F (36.7 C)  TempSrc:  Oral Oral Oral  SpO2:  92% 94% 96%  Weight: 91.5 kg     Height:       Supplemental O2: Room Air SpO2: 96 %   Physical Exam:  Physical Exam Constitutional:      General: She is not in acute distress.    Appearance: She is not ill-appearing.  HENT:     Head: Normocephalic.  Pulmonary:     Effort: Pulmonary effort is normal. No respiratory distress.  Abdominal:     General: Bowel sounds are normal. There is distension.     Tenderness: There is abdominal tenderness.     Comments: Similar erythema across the lower abdomen.  Mild tenderness to palpation.  Skin:    General: Skin is warm.  Neurological:     Mental Status: She is alert and oriented to person, place, and time.  Psychiatric:        Mood and Affect: Mood normal.        Behavior: Behavior normal.    Filed Weights   01/25/21 0540 01/26/21 0323 01/27/21 0236  Weight: 93.7 kg 92.6 kg 91.5 kg     Intake/Output Summary (Last 24 hours) at 01/27/2021 1332 Last data filed at 01/27/2021 1159 Gross per 24 hour  Intake 324.09 ml  Output 3300 ml  Net -2975.91 ml    Net IO Since Admission: -4,439.91 mL [01/27/21 1332]  Pertinent Labs: CBC Latest Ref  Rng & Units 01/27/2021 01/26/2021 01/25/2021  WBC 4.0 - 10.5 K/uL 39.8(H) 39.8(H) 42.8(H)  Hemoglobin 12.0 - 15.0 g/dL 10.7(L) 10.9(L) 10.9(L)  Hematocrit 36.0 - 46.0 % 33.1(L) 33.9(L) 32.9(L)  Platelets 150 - 400 K/uL 226 213 214    CMP Latest Ref Rng & Units 01/27/2021 01/26/2021 01/25/2021  Glucose 70 - 99 mg/dL 98 95 78  BUN 8 - 23 mg/dL 5(L) 6(L) 7(L)  Creatinine 0.44 - 1.00 mg/dL 0.45 0.37(L) 0.41(L)  Sodium 135 - 145 mmol/L 136 135 134(L)  Potassium 3.5 - 5.1 mmol/L 3.6 3.5 4.2  Chloride 98 - 111 mmol/L 101 101 100  CO2 22 - 32 mmol/L 28 27 24   Calcium 8.9 - 10.3 mg/dL 8.2(L) 7.9(L) 8.1(L)  Total Protein 6.5 - 8.1 g/dL - - 6.4(L)  Total Bilirubin 0.3 - 1.2 mg/dL - - 1.2  Alkaline Phos 38 - 126 U/L - - 83  AST 15 - 41 U/L - - 44(H)  ALT 0 - 44 U/L - - 16    Imaging: No results found.  Assessment/Plan:   Active Problems:   Abdominal wall cellulitis   Periumbilical hernia   Other ascites   Periumbilical abdominal pain  NASH (nonalcoholic steatohepatitis)   Myeloproliferative disease ([place])   Thrombocytopenia ([place])   Patient Summary: Savannah Hobbs is a 75 y.o. with pertinent PMH of PAF, Hiatal hernia, cirrhosis, and GERD who presented with abdominal pain and admit for treatment of abdominal wall cellulitis secondary to strangulated umbilical hernia.  Abdominal wall cellulitis secondary to strangulated umbilical hernia Similar appearance and erythema of the abdominal wall on physical exam today.  White blood count still at 39 today. Patient had a bowel movement today.  Patient and family are aware of the risk of of her abdominal operation in the setting of her underlying cirrhosis and ascites.  Patient is on day 2 of 3 before making the final decision of whether to proceed with surgical intervention. -Appreciate surgery recommendation -Continue IV daptomycin and Zosyn -Pain is well controlled with scheduled Tylenol and as needed oxycodone -Bowel regimen in place    Cirrhosis with Portal Hypertension Thrombocytopenia In the setting of medication use for knee pain. Confirmed by Dr. Carlean Purl in 2018.  Volume status stable today -Continue lasix 60 mg daily  -Continue spironolactone 150 mg daily  Paroxysmal Atrial Fibrillation S/P Ablation 09/26/2020 CHA2DS2-VASc score of 2, on Eliquis 5 mg twice daily and flecainide 200 mg as needed daily.  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  Not currently on treatment, monitored by her oncologist.   Full code IV fluid: None DVT: Holding Eliquis for anticipation of surgery.  Patient is ambulating in the room.  Do not need SCDs at this time. Diet: Heart  Prior to Admission Living Arrangement: Home Anticipated Discharge Location: Home Barriers to Discharge: Further medical treatment Dispo: Pending decision for surgical operation  France Ravens, MD 01/27/2021, 1:32 PM Pager: [phone]  Please contact the on call pager after 5 pm and on weekends at [phone].

## 2021-01-28 DIAGNOSIS — K7460 Unspecified cirrhosis of liver: Secondary | ICD-10-CM | POA: Diagnosis not present

## 2021-01-28 DIAGNOSIS — K7581 Nonalcoholic steatohepatitis (NASH): Secondary | ICD-10-CM | POA: Diagnosis not present

## 2021-01-28 DIAGNOSIS — K7290 Hepatic failure, unspecified without coma: Secondary | ICD-10-CM | POA: Diagnosis not present

## 2021-01-28 DIAGNOSIS — K429 Umbilical hernia without obstruction or gangrene: Secondary | ICD-10-CM | POA: Diagnosis not present

## 2021-01-28 DIAGNOSIS — D696 Thrombocytopenia, unspecified: Secondary | ICD-10-CM | POA: Diagnosis not present

## 2021-01-28 DIAGNOSIS — L03311 Cellulitis of abdominal wall: Secondary | ICD-10-CM | POA: Diagnosis not present

## 2021-01-28 DIAGNOSIS — R188 Other ascites: Secondary | ICD-10-CM | POA: Diagnosis not present

## 2021-01-28 LAB — BASIC METABOLIC PANEL
Anion gap: 9 (ref 5–15)
BUN: 8 mg/dL (ref 8–23)
CO2: 28 mmol/L (ref 22–32)
Calcium: 8.2 mg/dL — ABNORMAL LOW (ref 8.9–10.3)
Chloride: 100 mmol/L (ref 98–111)
Creatinine, Ser: 0.45 mg/dL (ref 0.44–1.00)
GFR, Estimated: >60 mL/min (ref >60)
Glucose, Bld: 88 mg/dL (ref 70–99)
Potassium: 3.8 mmol/L (ref 3.5–5.1)
Sodium: 137 mmol/L (ref 135–145)

## 2021-01-28 LAB — CBC
HCT: 32.5 % — ABNORMAL LOW (ref 36.0–46.0)
Hemoglobin: 10.2 g/dL — ABNORMAL LOW (ref 12.0–15.0)
MCH: 28.6 pg (ref 26.0–34.0)
MCHC: 31.4 g/dL (ref 30.0–36.0)
MCV: 91 fL (ref 80.0–100.0)
Platelets: 228 10*3/uL (ref 150–400)
RBC: 3.57 MIL/uL — ABNORMAL LOW (ref 3.87–5.11)
RDW: 19.2 % — ABNORMAL HIGH (ref 11.5–15.5)
WBC: 34.5 10*3/uL — ABNORMAL HIGH (ref 4.0–10.5)
nRBC: 0.1 % (ref 0.0–0.2)

## 2021-01-28 LAB — C-REACTIVE PROTEIN: CRP: 4.5 mg/dL — ABNORMAL HIGH (ref <1.0)

## 2021-01-28 LAB — SEDIMENTATION RATE: Sed Rate: 57 mm/hr — ABNORMAL HIGH (ref 0–22)

## 2021-01-28 NOTE — Progress Notes (Signed)
HD#9 Subjective:  Overnight Events: No events  Patient reports feeling much better today.  Patient states that the redness and hernia seem improved today.  Patient states that she is in less abdominal pain and shooting pain occurs less.  Patient reports feeling relieved after seeing that her white count had gone down to 34 today.  Patient reports having a bowel movement yesterday but not today.  Patient reports having urinated a lot due to Lasix.  Discussed plan with patient today to continue to provide supportive care with antibiotics.  Patient would like to postpone discussion of surgery given clinical improvement today.  Discussed that she should continue to speak with GI and surgery in order to obtain all the necessary information that she requires to make the decision whether she would like to continue to take antibiotics and be monitored or to have surgery performed.  We stated we would come reevaluate her tomorrow and see what her thoughts are.  Patient verbalized agreement understanding.  Objective:  Vital signs in last 24 hours: Vitals:   01/28/21 0400 01/28/21 0459 01/28/21 0900 01/28/21 1128  BP:  (!) 159/65 (!) 123/58 (!) 138/55  Pulse:  81 92 87  Resp:   16 18  Temp:  98.3 F (36.8 C) 98.7 F (37.1 C) 98.6 F (37 C)  TempSrc:  Oral Oral Oral  SpO2:  95% 95% 97%  Weight: 90.5 kg     Height:       Supplemental O2: Room Air SpO2: 97 %   Physical Exam:  Physical Exam Constitutional:      General: She is not in acute distress.    Appearance: She is not ill-appearing.  HENT:     Head: Normocephalic.  Pulmonary:     Effort: Pulmonary effort is normal. No respiratory distress.  Abdominal:     General: Bowel sounds are normal. There is distension.     Tenderness: There is abdominal tenderness.     Comments: Similar erythema across the lower abdomen.  Mild tenderness to palpation.  Skin:    General: Skin is warm.  Neurological:     Mental Status: She is alert and  oriented to person, place, and time.  Psychiatric:        Mood and Affect: Mood normal.        Behavior: Behavior normal.    Filed Weights   01/26/21 0323 01/27/21 0236 01/28/21 0400  Weight: 92.6 kg 91.5 kg 90.5 kg     Intake/Output Summary (Last 24 hours) at 01/28/2021 1148 Last data filed at 01/28/2021 1121 Gross per 24 hour  Intake 895.01 ml  Output 4150 ml  Net -3254.99 ml    Net IO Since Admission: -6,994.9 mL [01/28/21 1148]  Pertinent Labs: CBC Latest Ref Rng & Units 01/28/2021 01/27/2021 01/26/2021  WBC 4.0 - 10.5 K/uL 34.5(H) 39.8(H) 39.8(H)  Hemoglobin 12.0 - 15.0 g/dL 10.2(L) 10.7(L) 10.9(L)  Hematocrit 36.0 - 46.0 % 32.5(L) 33.1(L) 33.9(L)  Platelets 150 - 400 K/uL 228 226 213    CMP Latest Ref Rng & Units 01/28/2021 01/27/2021 01/26/2021  Glucose 70 - 99 mg/dL 88 98 95  BUN 8 - 23 mg/dL 8 5(L) 6(L)  Creatinine 0.44 - 1.00 mg/dL 0.45 0.45 0.37(L)  Sodium 135 - 145 mmol/L 137 136 135  Potassium 3.5 - 5.1 mmol/L 3.8 3.6 3.5  Chloride 98 - 111 mmol/L 100 101 101  CO2 22 - 32 mmol/L 28 28 27   Calcium 8.9 - 10.3 mg/dL 8.2(L) 8.2(L) 7.9(L)  Total Protein 6.5 - 8.1 g/dL - - -  Total Bilirubin 0.3 - 1.2 mg/dL - - -  Alkaline Phos 38 - 126 U/L - - -  AST 15 - 41 U/L - - -  ALT 0 - 44 U/L - - -    Imaging: No results found.  Assessment/Plan:   Active Problems:   Decompensated hepatic cirrhosis (HCC)   Abdominal wall cellulitis   Periumbilical hernia   Other ascites   Periumbilical abdominal pain   NASH (nonalcoholic steatohepatitis)   Myeloproliferative disease ([place])   Thrombocytopenia ([place])   Patient Summary: Savannah Hobbs is a 75 y.o. with pertinent PMH of PAF, Hiatal hernia, cirrhosis, and GERD who presented with abdominal pain and admit for treatment of abdominal wall cellulitis secondary to strangulated umbilical hernia.  Abdominal wall cellulitis secondary to strangulated umbilical hernia Patient has decided to postpone surgery given  improvement in erythema, swelling, and pain in abdomen.  Patient's erythema, swelling does seem to have regressed.  Patient's umbilicus appears less swollen today as well.  Given clinical improvement, will continue to assess and monitor. -Appreciate surgery recommendation -Continue to be NPO after midnight should patient's condition acutely worsen and require surgery -Continue IV daptomycin and Zosyn -Pain is well controlled with scheduled Tylenol and as needed oxycodone -Bowel regimen in place   Cirrhosis with Portal Hypertension Thrombocytopenia In the setting of medication use for knee pain. Confirmed by Dr. Carlean Purl in 2018.  Volume status stable today -Continue lasix 60 mg daily  -Continue spironolactone 150 mg daily  Paroxysmal Atrial Fibrillation S/P Ablation 09/26/2020 CHA2DS2-VASc score of 2, on Eliquis 5 mg twice daily and flecainide 200 mg as needed daily.  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  Not currently on treatment, monitored by her oncologist.   Full code IV fluid: None DVT: Holding Eliquis for anticipation of surgery.  Patient is ambulating in the room.  Do not need SCDs at this time. Diet: Heart  Prior to Admission Living Arrangement: Home Anticipated Discharge Location: Home Barriers to Discharge: Further medical treatment Dispo: Pending decision for surgical operation  France Ravens, MD 01/28/2021, 11:48 AM Pager: [phone]  Please contact the on call pager after 5 pm and on weekends at [phone].

## 2021-01-28 NOTE — Progress Notes (Signed)
Patient ID: Savannah Hobbs, female   DOB: December 06, 1945, 75 y.o.   MRN: [id number]     Subjective: Feeling better this am and think cellulitis continue to improve. No BM yet today. Seen with MD this am  ROS negative except as listed above. Objective: Vital signs in last 24 hours: Temp:  [98.1 F (36.7 C)-98.7 F (37.1 C)] 98.7 F (37.1 C) (11/08 0900) Pulse Rate:  [81-92] 92 (11/08 0900) Resp:  [16-19] 16 (11/08 0900) BP: (123-159)/(58-73) 123/58 (11/08 0900) SpO2:  [95 %-96 %] 95 % (11/08 0900) Weight:  [90.5 kg] 90.5 kg (11/08 0400) Last BM Date: 01/27/21  Intake/Output from previous day: 11/07 0701 - 11/08 0700 In: 775 [P.O.:720; IV Piggyback:55] Out: 6629 [Urine:3850] Intake/Output this shift: Total I/O In: 120 [P.O.:120] Out: 300 [Urine:300]  General appearance: cooperative Resp: normal effort on room air Cardio: regular rate and rhythm GI: soft, UH and periumbilical cellulitis with continued slight improvement especially on R side Extremities: calves soft and no TTP bilaterally  Lab Results: CBC  Recent Labs    01/27/21 0043 01/28/21 0402  WBC 39.8* 34.5*  HGB 10.7* 10.2*  HCT 33.1* 32.5*  PLT 226 228    BMET Recent Labs    01/27/21 0043 01/28/21 0402  NA 136 137  K 3.6 3.8  CL 101 100  CO2 28 28  GLUCOSE 98 88  BUN 5* 8  CREATININE 0.45 0.45  CALCIUM 8.2* 8.2*    PT/INR No results for input(s): LABPROT, INR in the last 72 hours.  ABG No results for input(s): PHART, HCO3 in the last 72 hours.  Invalid input(s): PCO2, PO2  Studies/Results: No results found.  Anti-infectives: Anti-infectives (From admission, onward)    Start     Dose/Rate Route Frequency Ordered Stop   01/24/21 0900  piperacillin-tazobactam (ZOSYN) IVPB 3.375 g        3.375 g 12.5 mL/hr over 240 Minutes Intravenous Every 8 hours 01/24/21 0808     01/23/21 1545  DAPTOmycin (CUBICIN) 450 mg in sodium chloride 0.9 % IVPB        6 mg/kg  71.7 kg (Adjusted) 118 mL/hr  over 30 Minutes Intravenous Daily 01/23/21 1459     01/21/21 1515  piperacillin-tazobactam (ZOSYN) IVPB 3.375 g  Status:  Discontinued        3.375 g 12.5 mL/hr over 240 Minutes Intravenous Every 8 hours 01/21/21 1429 01/21/21 1429   01/21/21 1515  piperacillin-tazobactam (ZOSYN) IVPB 3.375 g  Status:  Discontinued        3.375 g 12.5 mL/hr over 240 Minutes Intravenous Every 8 hours 01/21/21 1429 01/23/21 1459   01/19/21 2015  piperacillin-tazobactam (ZOSYN) IVPB 3.375 g        3.375 g 12.5 mL/hr over 240 Minutes Intravenous Every 8 hours 01/19/21 1928 01/21/21 0829   01/19/21 0630  vancomycin (VANCOREADY) IVPB 1250 mg/250 mL  Status:  Discontinued        1,250 mg 166.7 mL/hr over 90 Minutes Intravenous Every 24 hours 01/18/21 1127 01/20/21 1026   01/19/21 0600  cefTRIAXone (ROCEPHIN) 2 g in sodium chloride 0.9 % 100 mL IVPB  Status:  Discontinued        2 g 200 mL/hr over 30 Minutes Intravenous Every 24 hours 01/18/21 1026 01/19/21 1515   01/18/21 0445  vancomycin (VANCOREADY) IVPB 2000 mg/400 mL        2,000 mg 200 mL/hr over 120 Minutes Intravenous  Once 01/18/21 0440 01/18/21 0900   01/18/21 0445  cefTRIAXone (ROCEPHIN)  2 g in sodium chloride 0.9 % 100 mL IVPB        2 g 200 mL/hr over 30 Minutes Intravenous  Once 01/18/21 0440 01/18/21 7106       Assessment/Plan: 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    ID - currently zosyn and daptomycin FEN - HH diet, NPO MN VTE - continue holding eliquis  Foley - none   HLD GERD Myeloproliferative neoplasm PAF s/p ablation 09/26/2020 - hold eliquis (last dose 11/4 in AM)    LOS:  9 days   Winferd Humphrey, [facility] 01/28/2021, 10:42 AM Please see Amion for pager number during day hours 7:00am-4:30pm

## 2021-01-28 NOTE — Progress Notes (Signed)
Mobility Specialist Progress Note:   01/28/21 1523  Mobility  Activity Ambulated in hall  Level of Assistance Standby assist, set-up cues, supervision of patient - no hands on  Assistive Device Front wheel walker  Distance Ambulated (ft) 500 ft  Mobility Ambulated with assistance in hallway  Mobility Response Tolerated well  Mobility performed by Mobility specialist  $Mobility charge 1 Mobility   Pt received in bed willing to participate in mobility. No complaints of pain. Pt returned to bed with call bell in reach and all needs met.   [facility] Health and safety inspector Phone [phone]

## 2021-01-28 NOTE — Progress Notes (Addendum)
[facility] Infectious Diseases Follow Up Note  Patient Identification: Patient Name: Savannah Hobbs MRN: [id number] [place] Date: 01/17/2021  9:38 PM Age: 75 y.o.Today's Date: 01/28/2021   Reason for Visit: inacarcerated umbilical hernia   Active Problems:   Decompensated hepatic cirrhosis (HCC)   Abdominal wall cellulitis   Periumbilical hernia   Other ascites   Periumbilical abdominal pain   NASH (nonalcoholic steatohepatitis)   Myeloproliferative disease (HCC)   Thrombocytopenia (HCC)   Antibiotics: Ceftriaxone 10/28-10/29                    Vancomycin 10/28-10/30; Daptomycin 11/3-current                     Pip/tazo 10/30-current   Lines/Hardwares: None   Interval Events: afebrile, WBC went down from 39 to 34. No new cultures available    Assessment Incarcerated umbilical hernia containing omentum Abdominal wall cellulitis  -     Redness seems to be improving, still has indurated area around the umbilical region. Had one episode of nausea yesterday, otherwise no nausea and vomiting, had BM. Tolerating PO diet   NASH Cirrhosis/Portal HTN/Splenomegaly and Ascites MDS Morbid Obesity  H/o COVID OA s/p B/L TKR Leukocytosis in the setting of MDS   Recommendations Continue IV daptomycin and zosyn for now pending surgical plans.  Agree she is a high risk surgical candidate.However,  I am doubtful this is going to resolve with antibiotic alone.  Monitor WBC, fevers, added on ESR and CRP Serial abdominal exams  Fu surgical plans   Rest of the management as per the primary team. Thank you for the consult. Please page with pertinent questions or concerns.  ______________________________________________________________________ Subjective patient seen and examined at the bedside.  She feels better today. She had one episode of nausea yesterday but none today. Denies nausea, vomiting, diarrhea. Abdominal pain has  improved from 7 to 5/10  Vitals BP (!) 159/65   Pulse 81   Temp 98.3 F (36.8 C) (Oral)   Resp 19   Ht '5\' 5"'  (1.651 m)   Wt 90.5 kg   SpO2 95%   BMI 33.20 kg/m     Physical Exam Constitutional:  Not in acute distress, obese     Comments:   Cardiovascular:     Rate and Rhythm: Normal rate and regular rhythm.     Heart sounds:   Pulmonary:     Effort: Pulmonary effort is normal.     Comments:   Abdominal: Minimal tenderness in the mid abdomen and left lower quadrant, surrounding erythema /warmth around the umbilicus with central indurated umbilical region ( erythema seems to be improving)    Musculoskeletal:        General: No swelling or tenderness. B/l TKR  Skin:    Comments: as above   Neurological:     General: No focal deficit present.   Psychiatric:        Mood and Affect: Mood normal.    Pertinent Microbiology Results for orders placed or performed during the hospital encounter of 01/17/21  Resp Panel by RT-PCR (Flu A&B, Covid) Nasopharyngeal Swab     Status: None   Collection Time: 01/18/21  4:40 AM   Specimen: Nasopharyngeal Swab; Nasopharyngeal(NP) swabs in vial transport medium  Result Value Ref Range Status   SARS Coronavirus 2 by RT PCR NEGATIVE NEGATIVE Final    Comment: (NOTE) SARS-CoV-2 target nucleic acids are NOT DETECTED.  The SARS-CoV-2 RNA is generally detectable in upper  respiratory specimens during the acute phase of infection. The lowest concentration of SARS-CoV-2 viral copies this assay can detect is 138 copies/mL. A negative result does not preclude SARS-Cov-2 infection and should not be used as the sole basis for treatment or other patient management decisions. A negative result may occur with  improper specimen collection/handling, submission of specimen other than nasopharyngeal swab, presence of viral mutation(s) within the areas targeted by this assay, and inadequate number of viral copies(<138 copies/mL). A negative result  must be combined with clinical observations, patient history, and epidemiological information. The expected result is Negative.  Fact Sheet for Patients:  EntrepreneurPulse.com.au  Fact Sheet for Healthcare Providers:  IncredibleEmployment.be  This test is no t yet approved or cleared by the [place] FDA and  has been authorized for detection and/or diagnosis of SARS-CoV-2 by FDA under an Emergency Use Authorization (EUA). This EUA will remain  in effect (meaning this test can be used) for the duration of the COVID-19 declaration under Section 564(b)(1) of the Act, 21 U.S.C.section 360bbb-3(b)(1), unless the authorization is terminated  or revoked sooner.       Influenza A by PCR NEGATIVE NEGATIVE Final   Influenza B by PCR NEGATIVE NEGATIVE Final    Comment: (NOTE) The Xpert Xpress SARS-CoV-2/FLU/RSV plus assay is intended as an aid in the diagnosis of influenza from Nasopharyngeal swab specimens and should not be used as a sole basis for treatment. Nasal washings and aspirates are unacceptable for Xpert Xpress SARS-CoV-2/FLU/RSV testing.  Fact Sheet for Patients: EntrepreneurPulse.com.au  Fact Sheet for Healthcare Providers: IncredibleEmployment.be  This test is not yet approved or cleared by the [place] FDA and has been authorized for detection and/or diagnosis of SARS-CoV-2 by FDA under an Emergency Use Authorization (EUA). This EUA will remain in effect (meaning this test can be used) for the duration of the COVID-19 declaration under Section 564(b)(1) of the Act, 21 U.S.C. section 360bbb-3(b)(1), unless the authorization is terminated or revoked.  Performed at [facility], [place] [address], [place], [place] [address]   Culture, blood (single)     Status: None   Collection Time: 01/18/21  4:40 AM   Specimen: BLOOD LEFT HAND  Result Value Ref Range Status   Specimen Description  BLOOD LEFT HAND  Final   Special Requests   Final    BOTTLES DRAWN AEROBIC AND ANAEROBIC Blood Culture adequate volume   Culture   Final    NO GROWTH 5 DAYS Performed at [facility], [place] [address], [place], [place] [address]    Report Status 01/23/2021 FINAL  Final  MRSA Next Gen by PCR, Nasal     Status: None   Collection Time: 01/18/21 10:25 AM   Specimen: Nasal Mucosa; Nasal Swab  Result Value Ref Range Status   MRSA by PCR Next Gen NOT DETECTED NOT DETECTED Final    Comment: (NOTE) The GeneXpert MRSA Assay (FDA approved for NASAL specimens only), is one component of a comprehensive MRSA colonization surveillance program. It is not intended to diagnose MRSA infection nor to guide or monitor treatment for MRSA infections. Test performance is not FDA approved in patients less than 30 years old. Performed at [facility], [place] [address], [place], [place] [address]     Pertinent Lab. CBC Latest Ref Rng & Units 01/28/2021 01/27/2021 01/26/2021  WBC 4.0 - 10.5 K/uL 34.5(H) 39.8(H) 39.8(H)  Hemoglobin 12.0 - 15.0 g/dL 10.2(L) 10.7(L) 10.9(L)  Hematocrit 36.0 - 46.0 % 32.5(L)  33.1(L) 33.9(L)  Platelets 150 - 400 K/uL 228 226 213   CMP Latest Ref Rng & Units 01/28/2021 01/27/2021 01/26/2021  Glucose 70 - 99 mg/dL 88 98 95  BUN 8 - 23 mg/dL 8 5(L) 6(L)  Creatinine 0.44 - 1.00 mg/dL 0.45 0.45 0.37(L)  Sodium 135 - 145 mmol/L 137 136 135  Potassium 3.5 - 5.1 mmol/L 3.8 3.6 3.5  Chloride 98 - 111 mmol/L 100 101 101  CO2 22 - 32 mmol/L '28 28 27  ' Calcium 8.9 - 10.3 mg/dL 8.2(L) 8.2(L) 7.9(L)  Total Protein 6.5 - 8.1 g/dL - - -  Total Bilirubin 0.3 - 1.2 mg/dL - - -  Alkaline Phos 38 - 126 U/L - - -  AST 15 - 41 U/L - - -  ALT 0 - 44 U/L - - -     Pertinent Imaging today Plain films and CT images have been personally visualized and interpreted; radiology reports have been reviewed. Decision making incorporated into the Impression / Recommendations.  CT ABDOMEN PELVIS WO  CONTRAST  Result Date: 01/18/2021 CLINICAL DATA:  Abdominal pain with hernia suspected EXAM: CT ABDOMEN AND PELVIS WITHOUT CONTRAST TECHNIQUE: Multidetector CT imaging of the abdomen and pelvis was performed following the standard protocol without IV contrast. COMPARISON:  None. FINDINGS: Lower chest:  Borderline septal thickening at the bases. Hepatobiliary: Cirrhotic liver morphology with recanalized umbilical vein and splenomegaly.Cholelithiasis. No evidence of cholecystitis. Pancreas: Generalized atrophy Spleen: Enlarged with 22 cm craniocaudal span. Adrenals/Urinary Tract: Negative adrenals. No hydronephrosis or ureteral calculi. Two punctate left renal calculi. Depressed left kidney due to splenomegaly. Unremarkable bladder. Stomach/Bowel:  No obstruction. No bowel wall thickening. Vascular/Lymphatic: Varices in the upper abdomen. Atheromatous calcification of the aorta and branch vessels Reproductive:Hysterectomy. Other: Midline hernia containing ascitic fluid and congested fat. Diffuse subcutaneous edema of the anterior abdominal wall. Musculoskeletal: Osteopenia and lumbar spine degeneration. No emergent finding. IMPRESSION: 1. Large periumbilical hernia containing congested fat and ascitic fluid. 2. Anterior abdominal wall edema suggesting cellulitis. 3. Cirrhosis with portal hypertension. 4. Cholelithiasis and left nephrolithiasis. Electronically Signed   By: Jorje Guild M.D.   On: 01/18/2021 08:19   CT ABDOMEN PELVIS W CONTRAST  Result Date: 01/24/2021 CLINICAL DATA:  Abdominal pain EXAM: CT ABDOMEN AND PELVIS WITH CONTRAST TECHNIQUE: Multidetector CT imaging of the abdomen and pelvis was performed using the standard protocol following bolus administration of intravenous contrast. CONTRAST:  138m OMNIPAQUE IOHEXOL 350 MG/ML SOLN COMPARISON:  01/18/2021 FINDINGS: Lower chest: Heart is enlarged in size. Small linear densities seen in the left posterior costophrenic angle suggesting minimal  scarring or subsegmental atelectasis. There is interval decrease in left pleural effusion. Hepatobiliary: Liver measures 17.3 cm in length. There is nodularity in the liver surface. No definite focal abnormality is seen in the liver. There is no dilation of bile ducts. There is small calcified gallbladder stone. There is no wall thickening in gallbladder. Pancreas: There is atrophy. Spleen: Spleen measures 17.7 cm in maximum diameter. Splenule is seen along the medial margin of spleen. There are tortuous vessels in the splenic hilum, possibly suggesting portal hypertension. Adrenals/Urinary Tract: Adrenals are not enlarged. There is no hydronephrosis. There are 2 small calculi in the lower pole of left kidney. There are no demonstrable ureteral stones. Urinary bladder is not distended. Stomach/Bowel: Stomach is unremarkable. Small bowel loops are not dilated. Appendix is not distinctly seen. There is no pericecal inflammation. There is no significant wall thickening in colon. Vascular/Lymphatic: There is recanalization of umbilical vein. There  are tortuous vessels in the upper abdomen and anterior abdominal wall suggesting portosystemic shunt due to portal hypertension. There are scattered atherosclerotic plaques and calcifications in the aorta and its major branches. There are slightly enlarged lymph nodes in the retroperitoneum with no significant change. Reproductive: Uterus is not seen.  There are no adnexal masses. Other: Small ascites is present. There is no pneumoperitoneum. There is diffuse edema in the abdominal wall, especially in the left mid and lower abdomen. Umbilical hernia containing fat is seen. There is 6.2 cm loculated fluid collection in the left paraumbilical region with no significant change. There are no pockets of air within this fluid collection. Musculoskeletal: Degenerative changes are noted in the lower lumbar spine with encroachment of neural foramina and mild spinal stenosis.  IMPRESSION: There is no evidence of intestinal obstruction or pneumoperitoneum. There is no hydronephrosis. Cirrhosis of liver. Small ascites. Portal hypertension. Splenomegaly. Gallbladder stone. Small left renal stones. There is 6.2 cm loculated fluid collection in the left paraumbilical region with no significant interval change. Differential diagnostic possibilities would include entrapment of ascitic fluid in the paraumbilical hernia or resolving hematoma or an abscess. There is edema in subcutaneous plane, especially in the left lower and left mid abdomen suggesting contusion or cellulitis. There is slight worsening of this finding. Electronically Signed   By: Elmer Picker M.D.   On: 01/24/2021 14:04     I spent more than 35 minutes for this patient encounter including review of prior medical records, coordination of care  with greater than 50% of time being face to face/counseling and discussing diagnostics/treatment plan with the patient/family.  Electronically signed by:   Rosiland Oz, MD Infectious Disease Physician [facility] Pager: [phone]

## 2021-01-29 ENCOUNTER — Inpatient Hospital Stay (HOSPITAL_COMMUNITY): Payer: Medicare Other

## 2021-01-29 DIAGNOSIS — L03311 Cellulitis of abdominal wall: Secondary | ICD-10-CM | POA: Diagnosis not present

## 2021-01-29 DIAGNOSIS — K429 Umbilical hernia without obstruction or gangrene: Secondary | ICD-10-CM | POA: Diagnosis not present

## 2021-01-29 DIAGNOSIS — R188 Other ascites: Secondary | ICD-10-CM | POA: Diagnosis not present

## 2021-01-29 DIAGNOSIS — K7290 Hepatic failure, unspecified without coma: Secondary | ICD-10-CM | POA: Diagnosis not present

## 2021-01-29 DIAGNOSIS — D696 Thrombocytopenia, unspecified: Secondary | ICD-10-CM | POA: Diagnosis not present

## 2021-01-29 DIAGNOSIS — K7581 Nonalcoholic steatohepatitis (NASH): Secondary | ICD-10-CM | POA: Diagnosis not present

## 2021-01-29 HISTORY — PX: IR US GUIDE BX ASP/DRAIN: IMG2392

## 2021-01-29 LAB — BASIC METABOLIC PANEL
Anion gap: 10 (ref 5–15)
BUN: 6 mg/dL — ABNORMAL LOW (ref 8–23)
CO2: 25 mmol/L (ref 22–32)
Calcium: 8.1 mg/dL — ABNORMAL LOW (ref 8.9–10.3)
Chloride: 100 mmol/L (ref 98–111)
Creatinine, Ser: 0.41 mg/dL — ABNORMAL LOW (ref 0.44–1.00)
GFR, Estimated: >60 mL/min (ref >60)
Glucose, Bld: 81 mg/dL (ref 70–99)
Potassium: 4 mmol/L (ref 3.5–5.1)
Sodium: 135 mmol/L (ref 135–145)

## 2021-01-29 LAB — CBC
HCT: 33 % — ABNORMAL LOW (ref 36.0–46.0)
Hemoglobin: 10.6 g/dL — ABNORMAL LOW (ref 12.0–15.0)
MCH: 29.8 pg (ref 26.0–34.0)
MCHC: 32.1 g/dL (ref 30.0–36.0)
MCV: 92.7 fL (ref 80.0–100.0)
Platelets: 214 10*3/uL (ref 150–400)
RBC: 3.56 MIL/uL — ABNORMAL LOW (ref 3.87–5.11)
RDW: 19.4 % — ABNORMAL HIGH (ref 11.5–15.5)
WBC: 32.4 10*3/uL — ABNORMAL HIGH (ref 4.0–10.5)
nRBC: 0.1 % (ref 0.0–0.2)

## 2021-01-29 MED ORDER — LIDOCAINE HCL 1 % IJ SOLN
INTRAMUSCULAR | Status: AC
  Filled 2021-01-29: qty 20

## 2021-01-29 MED ORDER — LIDOCAINE HCL (PF) 1 % IJ SOLN
INTRAMUSCULAR | Status: DC | PRN
  Administered 2021-01-29: 5 mL

## 2021-01-29 NOTE — Consult Note (Signed)
IR requested to evaluate patient for possible image-guided periumbilical fluid collection aspiration. Imaging reviewed and procedure approved by Dr. Serafina Royals. Procedure discussed at the bedside with the patient and she is in agreement to proceed. Procedure will be done with local anesthesia only. Consent signed and in the chart. Patient tentatively scheduled for this afternoon but depending on IR availability this procedure may not be done until 01/30/21.  Please call IR with any questions.  Soyla Dryer, [place] [phone] 01/29/2021, 12:45 PM

## 2021-01-29 NOTE — Progress Notes (Signed)
Patient is adamant that she does not want surgery for umbilical hernia containing omentum with cellulitis. WBC remains elevated but slowly down-trending on abx. Cellulitis has been slowly improving. Recommend continuing antibiotics for total course of at least 14 days. Since patient refusing surgical intervention, we will sign off at this time. However, we are available for questions or if patient becomes open to surgical intervention. Please call us if we can be of further assistance.   Norm Parcel, [facility] 01/29/2021, 7:58 AM Please see Amion for pager number during day hours 7:00am-4:30pm

## 2021-01-29 NOTE — Progress Notes (Addendum)
[facility] Infectious Diseases Follow Up Note  Patient Identification: Patient Name: Savannah Hobbs MRN: [id number] [place] Date: 01/17/2021  9:38 PM Age: 75 y.o.Today's Date: 01/29/2021   Reason for Visit: inacarcerated umbilical hernia   Active Problems:   Decompensated hepatic cirrhosis (HCC)   Abdominal wall cellulitis   Periumbilical hernia   Other ascites   Periumbilical abdominal pain   NASH (nonalcoholic steatohepatitis)   Myeloproliferative disease (HCC)   Thrombocytopenia (HCC)   Antibiotics: Ceftriaxone 10/28-10/29                    Vancomycin 10/28-10/30; Daptomycin 11/3-current                     Pip/tazo 10/30-current   Lines/Hardwares: None   Interval Events: afebrile, WBC went down 39>34>32.   Assessment Incarcerated umbilical hernia containing omentum  Abdominal wall cellulitis  -     Redness/abdominal pain seems to be improving from yesterday, still has indurated area around the umbilical region. No nausea and vomiting, had BM. Tolerating PO diet   NASH Cirrhosis/Portal HTN/Splenomegaly and Ascites MDS Morbid Obesity  H/o COVID OA s/p B/L TKR Leukocytosis in the setting of MDS- decreasing    Recommendations Continue IV daptomycin and zosyn as is. Patient has refused surgery per surgical notes and recommended continuing abtx for 14 days.  Would check with IR if the loculated fluid collection is amenable for aspiration and drainage safely given proximity to bowel ( Please check with surgery if OK with IR guided aspiration). If goes for aspiration, please send for gram stain and aerobic/anaerobic cultures  May need repeat CT abd/pelvis end of the week to see tx response  Monitor CBC, CMP< CPK Discussed with primary team  Following   Rest of the management as per the primary team. Thank you for the consult. Please page with pertinent questions or  concerns.  ______________________________________________________________________ Subjective patient seen and examined at the bedside.  She says abdominal pain and redness is improving. Denies nausea, vomiting and abdominal pain. Having Bms.   Vitals BP (!) 138/48 (BP Location: Right Arm)   Pulse 78   Temp 98.5 F (36.9 C) (Oral)   Resp 18   Ht 5\' 5"  (1.651 m)   Wt 90.4 kg   SpO2 97%   BMI 33.17 kg/m     Physical Exam Constitutional:  Not in acute distress, obese     Comments:   Cardiovascular:     Rate and Rhythm: Normal rate and regular rhythm.     Heart sounds:   Pulmonary:     Effort: Pulmonary effort is normal.     Comments:   Abdominal: Minimal tenderness in the mid abdomen and left lower quadrant, surrounding erythema /warmth around the umbilicus with central indurated umbilical region ( erythema seems to be improving)  Musculoskeletal:        General: No swelling or tenderness. B/l TKR  Skin:    Comments: as above   Neurological:     General: No focal deficit present.   Psychiatric:        Mood and Affect: Mood normal.    Pertinent Microbiology Results for orders placed or performed during the hospital encounter of 01/17/21  Resp Panel by RT-PCR (Flu A&B, Covid) Nasopharyngeal Swab     Status: None   Collection Time: 01/18/21  4:40 AM   Specimen: Nasopharyngeal Swab; Nasopharyngeal(NP) swabs in vial transport medium  Result Value Ref Range Status   SARS Coronavirus 2  by RT PCR NEGATIVE NEGATIVE Final    Comment: (NOTE) SARS-CoV-2 target nucleic acids are NOT DETECTED.  The SARS-CoV-2 RNA is generally detectable in upper respiratory specimens during the acute phase of infection. The lowest concentration of SARS-CoV-2 viral copies this assay can detect is 138 copies/mL. A negative result does not preclude SARS-Cov-2 infection and should not be used as the sole basis for treatment or other patient management decisions. A negative result may occur with   improper specimen collection/handling, submission of specimen other than nasopharyngeal swab, presence of viral mutation(s) within the areas targeted by this assay, and inadequate number of viral copies(<138 copies/mL). A negative result must be combined with clinical observations, patient history, and epidemiological information. The expected result is Negative.  Fact Sheet for Patients:  EntrepreneurPulse.com.au  Fact Sheet for Healthcare Providers:  IncredibleEmployment.be  This test is no t yet approved or cleared by the [place] FDA and  has been authorized for detection and/or diagnosis of SARS-CoV-2 by FDA under an Emergency Use Authorization (EUA). This EUA will remain  in effect (meaning this test can be used) for the duration of the COVID-19 declaration under Section 564(b)(1) of the Act, 21 U.S.C.section 360bbb-3(b)(1), unless the authorization is terminated  or revoked sooner.       Influenza A by PCR NEGATIVE NEGATIVE Final   Influenza B by PCR NEGATIVE NEGATIVE Final    Comment: (NOTE) The Xpert Xpress SARS-CoV-2/FLU/RSV plus assay is intended as an aid in the diagnosis of influenza from Nasopharyngeal swab specimens and should not be used as a sole basis for treatment. Nasal washings and aspirates are unacceptable for Xpert Xpress SARS-CoV-2/FLU/RSV testing.  Fact Sheet for Patients: EntrepreneurPulse.com.au  Fact Sheet for Healthcare Providers: IncredibleEmployment.be  This test is not yet approved or cleared by the [place] FDA and has been authorized for detection and/or diagnosis of SARS-CoV-2 by FDA under an Emergency Use Authorization (EUA). This EUA will remain in effect (meaning this test can be used) for the duration of the COVID-19 declaration under Section 564(b)(1) of the Act, 21 U.S.C. section 360bbb-3(b)(1), unless the authorization is terminated  or revoked.  Performed at [facility], [place] [address], [place], [place] [address]   Culture, blood (single)     Status: None   Collection Time: 01/18/21  4:40 AM   Specimen: BLOOD LEFT HAND  Result Value Ref Range Status   Specimen Description BLOOD LEFT HAND  Final   Special Requests   Final    BOTTLES DRAWN AEROBIC AND ANAEROBIC Blood Culture adequate volume   Culture   Final    NO GROWTH 5 DAYS Performed at [facility], [place] [address], [place], [place] [address]    Report Status 01/23/2021 FINAL  Final  MRSA Next Gen by PCR, Nasal     Status: None   Collection Time: 01/18/21 10:25 AM   Specimen: Nasal Mucosa; Nasal Swab  Result Value Ref Range Status   MRSA by PCR Next Gen NOT DETECTED NOT DETECTED Final    Comment: (NOTE) The GeneXpert MRSA Assay (FDA approved for NASAL specimens only), is one component of a comprehensive MRSA colonization surveillance program. It is not intended to diagnose MRSA infection nor to guide or monitor treatment for MRSA infections. Test performance is not FDA approved in patients less than 61 years old. Performed at [facility], [place] [address] [place], [place] [address]     Pertinent Lab. CBC Latest Ref Rng & Units 01/29/2021  01/28/2021 01/27/2021  WBC 4.0 - 10.5 K/uL 32.4(H) 34.5(H) 39.8(H)  Hemoglobin 12.0 - 15.0 g/dL 10.6(L) 10.2(L) 10.7(L)  Hematocrit 36.0 - 46.0 % 33.0(L) 32.5(L) 33.1(L)  Platelets 150 - 400 K/uL 214 228 226   CMP Latest Ref Rng & Units 01/29/2021 01/28/2021 01/27/2021  Glucose 70 - 99 mg/dL 81 88 98  BUN 8 - 23 mg/dL 6(L) 8 5(L)  Creatinine 0.44 - 1.00 mg/dL 0.41(L) 0.45 0.45  Sodium 135 - 145 mmol/L 135 137 136  Potassium 3.5 - 5.1 mmol/L 4.0 3.8 3.6  Chloride 98 - 111 mmol/L 100 100 101  CO2 22 - 32 mmol/L 25 28 28   Calcium 8.9 - 10.3 mg/dL 8.1(L) 8.2(L) 8.2(L)  Total Protein 6.5 - 8.1 g/dL - - -  Total Bilirubin 0.3 - 1.2 mg/dL - - -  Alkaline Phos 38 - 126 U/L - - -  AST 15 - 41 U/L - - -   ALT 0 - 44 U/L - - -     Pertinent Imaging today Plain films and CT images have been personally visualized and interpreted; radiology reports have been reviewed. Decision making incorporated into the Impression / Recommendations.  I spent more than 35 minutes for this patient encounter including review of prior medical records, coordination of care  with greater than 50% of time being face to face/counseling and discussing diagnostics/treatment plan with the patient/family.  Electronically signed by:   Rosiland Oz, MD Infectious Disease Physician [facility] for Infectious Disease Pager: [phone]

## 2021-01-29 NOTE — Procedures (Signed)
Interventional Radiology Procedure Note  Procedure: Ultrasound guided periumbilical fluid collection  Findings: Please refer to procedural dictation for full description. Complex subcutaneous fluid collection just left of the umbilicus.  Approximately 10 mL purulent aspirate.  Sample sent to lab for culture.  Complications: None immediate  Estimated Blood Loss: < 5 mL  Recommendations: Follow up cultures.   Ruthann Cancer, MD Pager: [phone]

## 2021-01-29 NOTE — Progress Notes (Signed)
HD#10 Subjective:  Overnight Events: No events  Pt seen and assessed at bedside Patient reports that she feels that her swelling has gone down more today as well as minimal tenderness upon palpation of abdomen.  Patient states that she still feels the shooting pain occasionally when she moves but this is also reducing frequency.  Patient states that she no longer wants to have surgery performed but would consider speaking with IR regarding drainage of loculated fluid that was imaged on CT.  Discussed plan with patient today that we would consult IR and they would stop by to speak with her regarding their assessment of her situation.  Also discussed that we continue antibiotics and support her through this process.  Patient was appreciative and had no other questions at this time.   Objective:  Vital signs in last 24 hours: Vitals:   01/28/21 1128 01/28/21 2003 01/29/21 0007 01/29/21 0421  BP: (!) 138/55 (!) 143/55  (!) 138/48  Pulse: 87 90  78  Resp: 18 16  18   Temp: 98.6 F (37 C) 98.4 F (36.9 C)  98.5 F (36.9 C)  TempSrc: Oral Oral  Oral  SpO2: 97% 97%  97%  Weight:   90.4 kg   Height:       Supplemental O2: Room Air SpO2: 97 %   Physical Exam:  Physical Exam Constitutional:      General: She is not in acute distress.    Appearance: She is not ill-appearing.  HENT:     Head: Normocephalic.  Pulmonary:     Effort: Pulmonary effort is normal. No respiratory distress.  Abdominal:     General: Bowel sounds are normal. There is distension.     Tenderness: There is abdominal tenderness.     Comments: Similar erythema across the lower abdomen.  Mild tenderness to palpation.  Skin:    General: Skin is warm.  Neurological:     Mental Status: She is alert and oriented to person, place, and time.  Psychiatric:        Mood and Affect: Mood normal.        Behavior: Behavior normal.    Filed Weights   01/27/21 0236 01/28/21 0400 01/29/21 0007  Weight: 91.5 kg 90.5 kg  90.4 kg     Intake/Output Summary (Last 24 hours) at 01/29/2021 0830 Last data filed at 01/29/2021 0807 Gross per 24 hour  Intake 1710 ml  Output 3250 ml  Net -1540 ml    Net IO Since Admission: -7,854.9 mL [01/29/21 0830]  Pertinent Labs: CBC Latest Ref Rng & Units 01/29/2021 01/28/2021 01/27/2021  WBC 4.0 - 10.5 K/uL 32.4(H) 34.5(H) 39.8(H)  Hemoglobin 12.0 - 15.0 g/dL 10.6(L) 10.2(L) 10.7(L)  Hematocrit 36.0 - 46.0 % 33.0(L) 32.5(L) 33.1(L)  Platelets 150 - 400 K/uL 214 228 226    CMP Latest Ref Rng & Units 01/29/2021 01/28/2021 01/27/2021  Glucose 70 - 99 mg/dL 81 88 98  BUN 8 - 23 mg/dL 6(L) 8 5(L)  Creatinine 0.44 - 1.00 mg/dL 0.41(L) 0.45 0.45  Sodium 135 - 145 mmol/L 135 137 136  Potassium 3.5 - 5.1 mmol/L 4.0 3.8 3.6  Chloride 98 - 111 mmol/L 100 100 101  CO2 22 - 32 mmol/L 25 28 28   Calcium 8.9 - 10.3 mg/dL 8.1(L) 8.2(L) 8.2(L)  Total Protein 6.5 - 8.1 g/dL - - -  Total Bilirubin 0.3 - 1.2 mg/dL - - -  Alkaline Phos 38 - 126 U/L - - -  AST 15 -  41 U/L - - -  ALT 0 - 44 U/L - - -    Imaging: No results found.  Assessment/Plan:   Active Problems:   Decompensated hepatic cirrhosis (HCC)   Abdominal wall cellulitis   Periumbilical hernia   Other ascites   Periumbilical abdominal pain   NASH (nonalcoholic steatohepatitis)   Myeloproliferative disease ([place])   Thrombocytopenia ([place])   Patient Summary: Savannah Hobbs is a 75 y.o. with pertinent PMH of PAF, Hiatal hernia, cirrhosis, and GERD who presented with abdominal pain and admit for treatment of abdominal wall cellulitis secondary to strangulated umbilical hernia.  Abdominal wall cellulitis secondary to strangulated umbilical hernia Patient has opted to refuse surgery at this time given improvement in erythema, swelling, pain in abdomen.  Patient's erythema, swelling does seem to have regressed.  Patient's umbilicus appears less swollen today as well.  Given clinical improvement, will continue to assess  and monitor. -Surgery signed off as patient has refused surgery at this time -Continue IV daptomycin and Zosyn -Pain is well controlled with scheduled Tylenol and as needed oxycodone -Bowel regimen in place -Consulted IR for assessment of loculated fluid found on CT 01/24/21 and possibility of drainage of this loculated fluid.  Greatly appreciate assistance and recommendations.  Cirrhosis with Portal Hypertension Thrombocytopenia In the setting of medication use for knee pain. Confirmed by Dr. Carlean Purl in 2018.  Volume status stable today -Continue lasix 60 mg daily  -Continue spironolactone 150 mg daily  Paroxysmal Atrial Fibrillation S/P Ablation 09/26/2020 CHA2DS2-VASc score of 2, on Eliquis 5 mg twice daily and flecainide 200 mg as needed daily.  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  Not currently on treatment, monitored by her oncologist.   Full code IV fluid: None DVT: Holding Eliquis for anticipation of surgery.  Patient is ambulating in the room.  Do not need SCDs at this time. Diet: Heart  Prior to Admission Living Arrangement: Home Anticipated Discharge Location: Home Barriers to Discharge: Further medical treatment Dispo: Pending IR drainage of loculated fluid and antibiotics  France Ravens, MD 01/29/2021, 8:30 AM Pager: [phone]  Please contact the on call pager after 5 pm and on weekends at [phone].

## 2021-01-30 DIAGNOSIS — L0291 Cutaneous abscess, unspecified: Secondary | ICD-10-CM

## 2021-01-30 DIAGNOSIS — K429 Umbilical hernia without obstruction or gangrene: Secondary | ICD-10-CM | POA: Diagnosis not present

## 2021-01-30 DIAGNOSIS — L03311 Cellulitis of abdominal wall: Secondary | ICD-10-CM | POA: Diagnosis not present

## 2021-01-30 LAB — BASIC METABOLIC PANEL
Anion gap: 6 (ref 5–15)
BUN: 7 mg/dL — ABNORMAL LOW (ref 8–23)
CO2: 29 mmol/L (ref 22–32)
Calcium: 8.3 mg/dL — ABNORMAL LOW (ref 8.9–10.3)
Chloride: 102 mmol/L (ref 98–111)
Creatinine, Ser: 0.41 mg/dL — ABNORMAL LOW (ref 0.44–1.00)
GFR, Estimated: >60 mL/min (ref >60)
Glucose, Bld: 85 mg/dL (ref 70–99)
Potassium: 3.8 mmol/L (ref 3.5–5.1)
Sodium: 137 mmol/L (ref 135–145)

## 2021-01-30 LAB — CBC
HCT: 31.7 % — ABNORMAL LOW (ref 36.0–46.0)
Hemoglobin: 10.3 g/dL — ABNORMAL LOW (ref 12.0–15.0)
MCH: 29.3 pg (ref 26.0–34.0)
MCHC: 32.5 g/dL (ref 30.0–36.0)
MCV: 90.1 fL (ref 80.0–100.0)
Platelets: 194 10*3/uL (ref 150–400)
RBC: 3.52 MIL/uL — ABNORMAL LOW (ref 3.87–5.11)
RDW: 19.4 % — ABNORMAL HIGH (ref 11.5–15.5)
WBC: 28.3 10*3/uL — ABNORMAL HIGH (ref 4.0–10.5)
nRBC: 0.1 % (ref 0.0–0.2)

## 2021-01-30 MED ORDER — SODIUM CHLORIDE 0.9 % IV SOLN
INTRAVENOUS | Status: DC | PRN
  Administered 2021-01-31: 10 mL via INTRAVENOUS

## 2021-01-30 MED ORDER — APIXABAN 5 MG PO TABS
5.0000 mg | ORAL_TABLET | Freq: Two times a day (BID) | ORAL | Status: DC
  Administered 2021-01-30 – 2021-02-02 (×7): 5 mg via ORAL
  Filled 2021-01-30 (×7): qty 1

## 2021-01-30 NOTE — Progress Notes (Signed)
HD#11 Subjective:  Overnight Events: No events  Pt seen and assessed at bedside Patient reports continued reduction in swelling as well has improved abdominal erythema, swelling. Patient pleased that her WBC is now in the 20s and that she did not require surgery.  Discussed that we are still waiting for purulent fluid cultures that were drained from her abdomen yesterday.  Discussed plan to continue antibiotics IV until cultures come back.  Discussed possibility that the cultures may not grow so we would provide her with oral antibiotics that cover suspected etiologies of abscess.   Objective:  Vital signs in last 24 hours: Vitals:   01/29/21 1631 01/29/21 2017 01/30/21 0445 01/30/21 1118  BP: (!) 137/57 (!) 115/52 (!) 123/56 (!) 136/52  Pulse: 88 88 79 86  Resp:  18 20 16   Temp: 98.6 F (37 C) 97.7 F (36.5 C) 98.5 F (36.9 C) 98.4 F (36.9 C)  TempSrc: Oral Oral Oral Oral  SpO2: 93% 98% 96% 99%  Weight:   88.3 kg   Height:       Supplemental O2: Room Air SpO2: 99 %   Physical Exam:  Physical Exam Constitutional:      General: She is not in acute distress.    Appearance: She is not ill-appearing.  HENT:     Head: Normocephalic.  Pulmonary:     Effort: Pulmonary effort is normal. No respiratory distress.  Abdominal:     General: Bowel sounds are normal. There is distension.     Tenderness: There is abdominal tenderness.     Comments: Similar erythema across the lower abdomen.  Mild tenderness to palpation.  Skin:    General: Skin is warm.  Neurological:     Mental Status: She is alert and oriented to person, place, and time.  Psychiatric:        Mood and Affect: Mood normal.        Behavior: Behavior normal.    Filed Weights   01/28/21 0400 01/29/21 0007 01/30/21 0445  Weight: 90.5 kg 90.4 kg 88.3 kg     Intake/Output Summary (Last 24 hours) at 01/30/2021 1139 Last data filed at 01/30/2021 1120 Gross per 24 hour  Intake 1086.74 ml  Output 2100 ml   Net -1013.26 ml    Net IO Since Admission: -9,972.69 mL [01/30/21 1139]  Pertinent Labs: CBC Latest Ref Rng & Units 01/30/2021 01/29/2021 01/28/2021  WBC 4.0 - 10.5 K/uL 28.3(H) 32.4(H) 34.5(H)  Hemoglobin 12.0 - 15.0 g/dL 10.3(L) 10.6(L) 10.2(L)  Hematocrit 36.0 - 46.0 % 31.7(L) 33.0(L) 32.5(L)  Platelets 150 - 400 K/uL 194 214 228    CMP Latest Ref Rng & Units 01/30/2021 01/29/2021 01/28/2021  Glucose 70 - 99 mg/dL 85 81 88  BUN 8 - 23 mg/dL 7(L) 6(L) 8  Creatinine 0.44 - 1.00 mg/dL 0.41(L) 0.41(L) 0.45  Sodium 135 - 145 mmol/L 137 135 137  Potassium 3.5 - 5.1 mmol/L 3.8 4.0 3.8  Chloride 98 - 111 mmol/L 102 100 100  CO2 22 - 32 mmol/L 29 25 28   Calcium 8.9 - 10.3 mg/dL 8.3(L) 8.1(L) 8.2(L)  Total Protein 6.5 - 8.1 g/dL - - -  Total Bilirubin 0.3 - 1.2 mg/dL - - -  Alkaline Phos 38 - 126 U/L - - -  AST 15 - 41 U/L - - -  ALT 0 - 44 U/L - - -    Imaging: IR US Guide Bx Asp/Drain  Result Date: 01/29/2021 INDICATION: 75 year old female with periumbilical fluid collection  with surrounding cellulitis. EXAM: Ultrasound-guided aspiration MEDICATIONS: The patient is currently admitted to the hospital and receiving intravenous antibiotics. The antibiotics were administered within an appropriate time frame prior to the initiation of the procedure. ANESTHESIA/SEDATION: None. COMPLICATIONS: None immediate. PROCEDURE: Informed written consent was obtained from the patient after a thorough discussion of the procedural risks, benefits and alternatives. All questions were addressed. Maximal Sterile Barrier Technique was utilized including caps, mask, sterile gowns, sterile gloves, sterile drape, hand hygiene and skin antiseptic. A timeout was performed prior to the initiation of the procedure. Preprocedure ultrasound evaluation demonstrated a moderately complex subcutaneous fluid collection about the left aspect of the umbilicus measuring up to approximately 2.7 x 2.7 x 2.2 cm. Procedure was planned.  The periumbilical region was prepped and draped in standard fashion. Subdermal Local anesthesia was provided at the planned needle entry site. Under direct ultrasound visualization, a 7 cm Yueh catheter was advanced into the deep portion of the fluid collection. Approximately 10 mL of purulence fluid was aspirated. A sample was sent for culture. There is significantly decreased size of the complex collection with overall resolution of the simple fluid components. The catheter was removed. Hemostasis was achieved with brief manual compression. A sterile bandage was applied. The patient tolerated procedure well without immediate complication. IMPRESSION: Technically successful ultrasound-guided aspiration of complex left periumbilical fluid collection yielding 10 mL of purulent fluid. A sample was sent to the lab for culture. Ruthann Cancer, MD [facility] Radiology Electronically Signed   By: Ruthann Cancer M.D.   On: 01/29/2021 16:06    Assessment/Plan:   Active Problems:   Decompensated hepatic cirrhosis (HCC)   Abdominal wall cellulitis   Periumbilical hernia   Other ascites   Periumbilical abdominal pain   NASH (nonalcoholic steatohepatitis)   Myeloproliferative disease ([place])   Thrombocytopenia (HCC)   Abdominal fluid collection   Patient Summary: Savannah Hobbs is a 75 y.o. with pertinent PMH of PAF, Hiatal hernia, cirrhosis, and GERD who presented with abdominal pain and admit for treatment of abdominal wall cellulitis secondary to strangulated umbilical hernia.  #Abdominal wall cellulitis secondary to strangulated umbilical hernia #Periumbilical abscess  Patient's erythema, swelling does seem to have regressed.  Patient's umbilicus appears less swollen today as well.  Periumbilical purulent fluid has been aspirated and sent to lab for cultures. Given clinical improvement, will continue to assess and monitor. -Continue IV daptomycin and  Zosyn -Pain is well controlled with scheduled Tylenol and as needed oxycodone -Bowel regimen in place -Periumbilical purulent aspirate culture pending  Cirrhosis with Portal Hypertension Thrombocytopenia In the setting of medication use for knee pain. Confirmed by Dr. Carlean Purl in 2018.  Volume status stable today -Continue lasix 60 mg daily  -Continue spironolactone 150 mg daily  Paroxysmal Atrial Fibrillation S/P Ablation 09/26/2020 CHA2DS2-VASc score of 2, on Eliquis 5 mg twice daily and flecainide 200 mg as needed daily.  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  Not currently on treatment, monitored by her oncologist.   Full code IV fluid: None DVT: Eliquis Diet: Heart  Prior to Admission Living Arrangement: Home Anticipated Discharge Location: Home  Barriers to Discharge: Further medical treatment Dispo: Pending purulent fluid culture  France Ravens, MD 01/30/2021, 11:39 AM Pager: [phone]  Please contact the on call pager after 5 pm and on weekends at [phone].

## 2021-01-30 NOTE — Care Management Important Message (Signed)
Important Message  Patient Details  Name: Savannah Hobbs MRN: [id number] Date of Birth: 10-Jul-1945   Medicare Important Message Given:  Yes     [name] 01/30/2021, 10:13 AM

## 2021-01-30 NOTE — Progress Notes (Signed)
Mobility Specialist Progress Note:   01/30/21 1050  Mobility  Activity Ambulated in hall  Level of Assistance Standby assist, set-up cues, supervision of patient - no hands on  Assistive Device Other (Comment) (IV pole)  Distance Ambulated (ft) 430 ft  Mobility Ambulated with assistance in hallway  Mobility Response Tolerated well  Mobility performed by Mobility specialist  $Mobility charge 1 Mobility   Pt received in bed willing to participate in mobility. No complaints of pain. Pt returned to EOB with call bell in reach and all needs met.   [facility] Health and safety inspector Phone [phone]

## 2021-01-30 NOTE — Progress Notes (Signed)
Pt having difficulty sleeping tonight. Pt requests med for sleep. MD paged.

## 2021-01-31 ENCOUNTER — Encounter (HOSPITAL_COMMUNITY): Payer: Self-pay | Admitting: Internal Medicine

## 2021-01-31 DIAGNOSIS — L03311 Cellulitis of abdominal wall: Secondary | ICD-10-CM | POA: Diagnosis not present

## 2021-01-31 DIAGNOSIS — K7290 Hepatic failure, unspecified without coma: Secondary | ICD-10-CM | POA: Diagnosis not present

## 2021-01-31 DIAGNOSIS — K7460 Unspecified cirrhosis of liver: Secondary | ICD-10-CM | POA: Diagnosis not present

## 2021-01-31 DIAGNOSIS — K7581 Nonalcoholic steatohepatitis (NASH): Secondary | ICD-10-CM | POA: Diagnosis not present

## 2021-01-31 DIAGNOSIS — K429 Umbilical hernia without obstruction or gangrene: Secondary | ICD-10-CM | POA: Diagnosis not present

## 2021-01-31 DIAGNOSIS — D696 Thrombocytopenia, unspecified: Secondary | ICD-10-CM | POA: Diagnosis not present

## 2021-01-31 DIAGNOSIS — R188 Other ascites: Secondary | ICD-10-CM | POA: Diagnosis not present

## 2021-01-31 LAB — CBC
HCT: 34.2 % — ABNORMAL LOW (ref 36.0–46.0)
Hemoglobin: 11.3 g/dL — ABNORMAL LOW (ref 12.0–15.0)
MCH: 29.5 pg (ref 26.0–34.0)
MCHC: 33 g/dL (ref 30.0–36.0)
MCV: 89.3 fL (ref 80.0–100.0)
Platelets: 232 10*3/uL (ref 150–400)
RBC: 3.83 MIL/uL — ABNORMAL LOW (ref 3.87–5.11)
RDW: 19.5 % — ABNORMAL HIGH (ref 11.5–15.5)
WBC: 33.2 10*3/uL — ABNORMAL HIGH (ref 4.0–10.5)
nRBC: 0.1 % (ref 0.0–0.2)

## 2021-01-31 LAB — CK: Total CK: 30 U/L — ABNORMAL LOW (ref 38–234)

## 2021-01-31 LAB — BASIC METABOLIC PANEL
Anion gap: 8 (ref 5–15)
BUN: 7 mg/dL — ABNORMAL LOW (ref 8–23)
CO2: 26 mmol/L (ref 22–32)
Calcium: 8.6 mg/dL — ABNORMAL LOW (ref 8.9–10.3)
Chloride: 101 mmol/L (ref 98–111)
Creatinine, Ser: 0.41 mg/dL — ABNORMAL LOW (ref 0.44–1.00)
GFR, Estimated: >60 mL/min (ref >60)
Glucose, Bld: 91 mg/dL (ref 70–99)
Potassium: 3.9 mmol/L (ref 3.5–5.1)
Sodium: 135 mmol/L (ref 135–145)

## 2021-01-31 LAB — SEDIMENTATION RATE: Sed Rate: 55 mm/hr — ABNORMAL HIGH (ref 0–22)

## 2021-01-31 LAB — C-REACTIVE PROTEIN: CRP: 2.3 mg/dL — ABNORMAL HIGH (ref <1.0)

## 2021-01-31 MED ORDER — MAGIC MOUTHWASH W/LIDOCAINE
5.0000 mL | Freq: Three times a day (TID) | ORAL | Status: DC | PRN
  Administered 2021-01-31 – 2021-02-01 (×2): 5 mL via ORAL
  Filled 2021-01-31 (×3): qty 5

## 2021-01-31 NOTE — Progress Notes (Signed)
Mobility Specialist: Progress Note   01/31/21 1431  Mobility  Activity Ambulated in hall  Level of Assistance Modified independent, requires aide device or extra time  Assistive Device Front wheel walker  Distance Ambulated (ft) 470 ft  Mobility Ambulated with assistance in hallway  Mobility Response Tolerated well  Mobility performed by Mobility specialist  $Mobility charge 1 Mobility   During Mobility: 88 HR Post-Mobility: 84 HR  Pt had no c/o during ambulation. Pt is sitting EOB after walk with call bell in reach.   [facility] Leoncio Hansen Mobility Specialist Mobility Specialist Phone: [phone]

## 2021-01-31 NOTE — Progress Notes (Addendum)
HD#12 Subjective:  Overnight Events: No events  Pt seen and assessed at bedside Patient feels good today. Patient had bowel movement this morning.  Patient reports pain is improving, and denies dysuria.  Patient reports legs still being swollen but only when she walks around for some time.  Discussed plan with patient to continue to monitor but patient appears more stable for discharge.  Patient verbalized that she would like to discharge tomorrow pending aspirate culture.  Objective:  Vital signs in last 24 hours: Vitals:   01/30/21 0445 01/30/21 1118 01/30/21 1955 01/31/21 0419  BP: (!) 123/56 (!) 136/52 (!) 127/52 (!) 120/51  Pulse: 79 86 87 83  Resp: 20 16 20 18   Temp: 98.5 F (36.9 C) 98.4 F (36.9 C) 98.3 F (36.8 C) 98.5 F (36.9 C)  TempSrc: Oral Oral Oral Oral  SpO2: 96% 99% 97% 93%  Weight: 88.3 kg     Height:       Supplemental O2: Room Air SpO2: 93 %   Physical Exam:  Physical Exam Constitutional:      General: She is not in acute distress.    Appearance: She is not ill-appearing.  HENT:     Head: Normocephalic.  Pulmonary:     Effort: Pulmonary effort is normal. No respiratory distress.  Abdominal:     General: Bowel sounds are normal. There is distension.     Tenderness: There is no abdominal tenderness.     Comments: Similar erythema across the lower abdomen.  Mild tenderness to palpation.  Musculoskeletal:     Right lower leg: 2+ Pitting Edema present.     Left lower leg: 2+ Pitting Edema present.  Skin:    General: Skin is warm.  Neurological:     Mental Status: She is alert and oriented to person, place, and time.  Psychiatric:        Mood and Affect: Mood normal.        Behavior: Behavior normal.    Filed Weights   01/28/21 0400 01/29/21 0007 01/30/21 0445  Weight: 90.5 kg 90.4 kg 88.3 kg     Intake/Output Summary (Last 24 hours) at 01/31/2021 0526 Last data filed at 01/31/2021 0339 Gross per 24 hour  Intake 1085.29 ml   Output 4251 ml  Net -3165.71 ml    Net IO Since Admission: -12,675.08 mL [01/31/21 0526]  Pertinent Labs: CBC Latest Ref Rng & Units 01/30/2021 01/29/2021 01/28/2021  WBC 4.0 - 10.5 K/uL 28.3(H) 32.4(H) 34.5(H)  Hemoglobin 12.0 - 15.0 g/dL 10.3(L) 10.6(L) 10.2(L)  Hematocrit 36.0 - 46.0 % 31.7(L) 33.0(L) 32.5(L)  Platelets 150 - 400 K/uL 194 214 228    CMP Latest Ref Rng & Units 01/30/2021 01/29/2021 01/28/2021  Glucose 70 - 99 mg/dL 85 81 88  BUN 8 - 23 mg/dL 7(L) 6(L) 8  Creatinine 0.44 - 1.00 mg/dL 0.41(L) 0.41(L) 0.45  Sodium 135 - 145 mmol/L 137 135 137  Potassium 3.5 - 5.1 mmol/L 3.8 4.0 3.8  Chloride 98 - 111 mmol/L 102 100 100  CO2 22 - 32 mmol/L 29 25 28   Calcium 8.9 - 10.3 mg/dL 8.3(L) 8.1(L) 8.2(L)  Total Protein 6.5 - 8.1 g/dL - - -  Total Bilirubin 0.3 - 1.2 mg/dL - - -  Alkaline Phos 38 - 126 U/L - - -  AST 15 - 41 U/L - - -  ALT 0 - 44 U/L - - -    Imaging: No results found.  Assessment/Plan:   Active Problems:  Decompensated hepatic cirrhosis (HCC)   Abdominal wall cellulitis   Periumbilical hernia   Other ascites   Periumbilical abdominal pain   NASH (nonalcoholic steatohepatitis)   Myeloproliferative disease (HCC)   Thrombocytopenia (HCC)   Abdominal fluid collection   Abscess   Patient Summary: Savannah Hobbs is a 75 y.o. with pertinent PMH of PAF, Hiatal hernia, cirrhosis, and GERD who presented with abdominal pain and admit for treatment of abdominal wall cellulitis secondary to strangulated umbilical hernia.  #Abdominal wall cellulitis secondary to strangulated umbilical hernia #Periumbilical abscess s/p IR aspiration  Patient's erythema, swelling does seem to have regressed.  Patient's umbilicus appears progressively less swollen today.  Periumbilical purulent fluid has been aspirated and sent to lab for cultures. Given clinical improvement, plan to switch patient to oral antibiotics tomorrow and discharge patient tomorrow.  White blood  cell count mildly elevated to 32 today but patient may be hemoconcentrated. CRP is also downtrending.  -Continue IV daptomycin and Zosyn -Pain is well controlled with scheduled Tylenol and as needed oxycodone -Bowel regimen in place -Periumbilical purulent aspirate culture final report pending.  Rare gram-negative rods were identified on culture with identification and susceptibilities to follow. -Infectious disease following, greatly appreciate recommendations -Consider CT tomorrow should patient WBC continue to uptrend  Decompensated Cirrhosis with Portal Hypertension Ascites Thrombocytopenia In the setting of medication use for knee pain. Confirmed by Dr. Carlean Purl in 2018.  Volume status stable today - IV Zosyn for likely peritonitis -Continue lasix 60 mg daily  -Continue spironolactone 150 mg daily  Paroxysmal Atrial Fibrillation S/P Ablation 09/26/2020 CHA2DS2-VASc score of 2, on Eliquis 5 mg twice daily and flecainide 200 mg as needed daily.  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  Not currently on treatment, monitored by her oncologist.   Full code IV fluid: None DVT: Eliquis Diet: Heart  Prior to Admission Living Arrangement: Home Anticipated Discharge Location: Home Barriers to Discharge: Further medical treatment Dispo: Pending purulent fluid culture  France Ravens, MD 01/31/2021, 5:26 AM Pager: [phone]  Please contact the on call pager after 5 pm and on weekends at [phone].

## 2021-01-31 NOTE — Plan of Care (Signed)

## 2021-01-31 NOTE — Progress Notes (Signed)
Pharmacy Antibiotic Note- Follow-Up  Savannah Hobbs is a 75 y.o. female admitted on 01/17/2021 with strangulated piece of omentum in her hernia.  She also has abdominal wall cellulitis.  Pharmacy consulted for daptomycin and zosyn dosing.  Good renal function CrCl ~69 ml/min.  WBC remains elevated  25> up to 44.6 > 39.8.  Afebrile.  CRP 4.5>2.3 Patient refused surgery.  11/9 s/p U/S periumbilical fluid collection,  Left abscess umbilicus culture grew rare GNR,  pending  Weekly CK qFri 11/4  CK =  33 and 39 11/11 CK = 30  Plan:  Continue Zosyn 3.375g IV q 8 hrs (extended interval infusion) Continue Daptomycin 6mg  /kg q24h of (ABW)   Check CK on fridays  Follow up on left absess umbilicus culture  GNR ID and sensitivities.     Height: 5\' 5"  (165.1 cm) Weight: 87.4 kg (192 lb 11.2 oz) IBW/kg (Calculated) : 57  Temp (24hrs), Avg:98.7 F (37.1 C), Min:98.3 F (36.8 C), Max:99 F (37.2 C)  Recent Labs  Lab 01/27/21 0043 01/28/21 0402 01/29/21 0321 01/30/21 0411 01/31/21 0450  WBC 39.8* 34.5* 32.4* 28.3* 33.2*  CREATININE 0.45 0.45 0.41* 0.41* 0.41*     Estimated Creatinine Clearance: 69.4 mL/min (A) (by C-G formula based on SCr of 0.41 mg/dL (L)).    Allergies  Allergen Reactions   Shellfish Allergy Hives    Antimicrobials this admission: Vanc 10/29 >> 10/31 Ceftriaxone 10/29 >> 10/30 Pip/tazp 10/30 >>  Dapto 11/3 >>  Dose adjustments this admission: N/A  Microbiology results: 10/29 BCx: negative 10/29 MRSA PCR: negative 11/9 left abscess umbilicus cx: rare GNR pending   Nicole Cella, RPh Clinical Pharmacist [phone]  01/31/2021 12:12 PM  [facility] pharmacy phone numbers are listed on [email or web address]

## 2021-01-31 NOTE — Progress Notes (Signed)
[facility] Infectious Diseases Follow Up Note  Patient Identification: Patient Name: Savannah Hobbs MRN: [id number] [place] Date: 01/17/2021  9:38 PM Age: 75 y.o.Today's Date: 01/31/2021   Reason for Visit: inacarcerated umbilical hernia   Active Problems:   Decompensated hepatic cirrhosis (HCC)   Abdominal wall cellulitis   Periumbilical hernia   Other ascites   Periumbilical abdominal pain   NASH (nonalcoholic steatohepatitis)   Myeloproliferative disease (HCC)   Thrombocytopenia (HCC)   Abdominal fluid collection   Abscess   Antibiotics: Ceftriaxone 10/28-10/29                    Vancomycin 10/28-10/30; Daptomycin 11/3-current                     Pip/tazo 10/30-current   Lines/Hardwares: None   Interval Events: afebrile, WBC uptrending   Assessment Incarcerated umbilical hernia containing omentum - s/p IR guided aspiration of complex left periumbilical fluid collection yielding 10 mL of purulent fluid. Cx growing GNR -Patient is on 14 days abtx trial as she has refused surgery in the setting of being a high risk candidate for surgery   Abdominal wall cellulitis - redness and cellulitis in the abdominal wall is getting better, still has indurated area in the periumbilical region which has tenderness.  WBC went up to 32. CRP 4.5>2.3  NASH Cirrhosis/Portal HTN/Splenomegaly and Ascites MDS Morbid Obesity  H/o COVID OA s/p B/L TKR Leukocytosis in the setting of MDS  Recommendations Continue IV daptomycin ( MRSA coverage for abdominal wall cellulitis) and zosyn ( peritonitis coverage) as is pending identification of GNR; plan to de-escalate zosyn to unsyn if GNR is not PsA She still has significant induration in the periumbilical area and anticipate  will need few more days of IV abtx before considering switch to PO Repeat CBC tomorrow. If up trending, need to repeat CT abd/pelvis over the weekend  Monitor CBC and  CMP  Dr Gale Journey covering this weekend. I will follow up again on Monday.   Rest of the management as per the primary team. Thank you for the consult. Please page with pertinent questions or concerns.  ______________________________________________________________________ Subjective patient seen and examined at the bedside.  She is walking around her bed, Denies nausea, vomiting, diarrhea. Having Bms, Abdominal pain is 3/4. Tolerating PO diet   Vitals BP (!) 120/51 (BP Location: Right Arm)   Pulse 83   Temp 98.5 F (36.9 C) (Oral)   Resp 18   Ht 5\' 5"  (1.651 m)   Wt 87.4 kg   SpO2 93%   BMI 32.07 kg/m     Physical Exam Constitutional:  Not in acute distress, obese     Comments:   Cardiovascular:     Rate and Rhythm: Normal rate and regular rhythm.     Heart sounds:   Pulmonary:     Effort: Pulmonary effort is normal.     Comments:   Abdominal: Midabdomen erythema is improving, central indurated umbilical region with tenderness  Musculoskeletal:        General: No swelling or tenderness. B/l TKR  Skin:    Comments: as above   Neurological:     General: No focal deficit present.   Psychiatric:        Mood and Affect: Mood normal.    Pertinent Microbiology Results for orders placed or performed during the hospital encounter of 01/17/21  Resp Panel by RT-PCR (Flu A&B, Covid) Nasopharyngeal Swab     Status: None  Collection Time: 01/18/21  4:40 AM   Specimen: Nasopharyngeal Swab; Nasopharyngeal(NP) swabs in vial transport medium  Result Value Ref Range Status   SARS Coronavirus 2 by RT PCR NEGATIVE NEGATIVE Final    Comment: (NOTE) SARS-CoV-2 target nucleic acids are NOT DETECTED.  The SARS-CoV-2 RNA is generally detectable in upper respiratory specimens during the acute phase of infection. The lowest concentration of SARS-CoV-2 viral copies this assay can detect is 138 copies/mL. A negative result does not preclude SARS-Cov-2 infection and should not be used as  the sole basis for treatment or other patient management decisions. A negative result may occur with  improper specimen collection/handling, submission of specimen other than nasopharyngeal swab, presence of viral mutation(s) within the areas targeted by this assay, and inadequate number of viral copies(<138 copies/mL). A negative result must be combined with clinical observations, patient history, and epidemiological information. The expected result is Negative.  Fact Sheet for Patients:  EntrepreneurPulse.com.au  Fact Sheet for Healthcare Providers:  IncredibleEmployment.be  This test is no t yet approved or cleared by the [place] FDA and  has been authorized for detection and/or diagnosis of SARS-CoV-2 by FDA under an Emergency Use Authorization (EUA). This EUA will remain  in effect (meaning this test can be used) for the duration of the COVID-19 declaration under Section 564(b)(1) of the Act, 21 U.S.C.section 360bbb-3(b)(1), unless the authorization is terminated  or revoked sooner.       Influenza A by PCR NEGATIVE NEGATIVE Final   Influenza B by PCR NEGATIVE NEGATIVE Final    Comment: (NOTE) The Xpert Xpress SARS-CoV-2/FLU/RSV plus assay is intended as an aid in the diagnosis of influenza from Nasopharyngeal swab specimens and should not be used as a sole basis for treatment. Nasal washings and aspirates are unacceptable for Xpert Xpress SARS-CoV-2/FLU/RSV testing.  Fact Sheet for Patients: EntrepreneurPulse.com.au  Fact Sheet for Healthcare Providers: IncredibleEmployment.be  This test is not yet approved or cleared by the [place] FDA and has been authorized for detection and/or diagnosis of SARS-CoV-2 by FDA under an Emergency Use Authorization (EUA). This EUA will remain in effect (meaning this test can be used) for the duration of the COVID-19 declaration under Section 564(b)(1) of  the Act, 21 U.S.C. section 360bbb-3(b)(1), unless the authorization is terminated or revoked.  Performed at [facility], [place] [address], [place], [place] [address]   Culture, blood (single)     Status: None   Collection Time: 01/18/21  4:40 AM   Specimen: BLOOD LEFT HAND  Result Value Ref Range Status   Specimen Description BLOOD LEFT HAND  Final   Special Requests   Final    BOTTLES DRAWN AEROBIC AND ANAEROBIC Blood Culture adequate volume   Culture   Final    NO GROWTH 5 DAYS Performed at [facility], [place] [address], [place], [place] [address]    Report Status 01/23/2021 FINAL  Final  MRSA Next Gen by PCR, Nasal     Status: None   Collection Time: 01/18/21 10:25 AM   Specimen: Nasal Mucosa; Nasal Swab  Result Value Ref Range Status   MRSA by PCR Next Gen NOT DETECTED NOT DETECTED Final    Comment: (NOTE) The GeneXpert MRSA Assay (FDA approved for NASAL specimens only), is one component of a comprehensive MRSA colonization surveillance program. It is not intended to diagnose MRSA infection nor to guide or monitor treatment for MRSA infections. Test performance is not FDA approved in patients less than 2  years old. Performed at [facility], [place] [address] [place], [place] [address]   Aerobic/Anaerobic Culture w Gram Stain (surgical/deep wound)     Status: None (Preliminary result)   Collection Time: 01/29/21  2:45 PM   Specimen: Abscess  Result Value Ref Range Status   Specimen Description ABSCESS UMBILICUS  Final   Special Requests NONE  Final   Gram Stain   Final    MODERATE WBC PRESENT, PREDOMINANTLY PMN NO ORGANISMS SEEN    Culture   Final    RARE GRAM NEGATIVE RODS CULTURE REINCUBATED FOR BETTER GROWTH Performed at [facility], [place] [address], [place], [place] [address]    Report Status PENDING  Incomplete    Pertinent Lab. CBC Latest Ref Rng & Units 01/31/2021 01/30/2021 01/29/2021  WBC 4.0 - 10.5 K/uL 33.2(H) 28.3(H) 32.4(H)   Hemoglobin 12.0 - 15.0 g/dL 11.3(L) 10.3(L) 10.6(L)  Hematocrit 36.0 - 46.0 % 34.2(L) 31.7(L) 33.0(L)  Platelets 150 - 400 K/uL 232 194 214   CMP Latest Ref Rng & Units 01/31/2021 01/30/2021 01/29/2021  Glucose 70 - 99 mg/dL 91 85 81  BUN 8 - 23 mg/dL 7(L) 7(L) 6(L)  Creatinine 0.44 - 1.00 mg/dL 0.41(L) 0.41(L) 0.41(L)  Sodium 135 - 145 mmol/L 135 137 135  Potassium 3.5 - 5.1 mmol/L 3.9 3.8 4.0  Chloride 98 - 111 mmol/L 101 102 100  CO2 22 - 32 mmol/L 26 29 25   Calcium 8.9 - 10.3 mg/dL 8.6(L) 8.3(L) 8.1(L)  Total Protein 6.5 - 8.1 g/dL - - -  Total Bilirubin 0.3 - 1.2 mg/dL - - -  Alkaline Phos 38 - 126 U/L - - -  AST 15 - 41 U/L - - -  ALT 0 - 44 U/L - - -     Pertinent Imaging today Plain films and CT images have been personally visualized and interpreted; radiology reports have been reviewed. Decision making incorporated into the Impression / Recommendations.  I spent more than 35 minutes for this patient encounter including review of prior medical records, coordination of care  with greater than 50% of time being face to face/counseling and discussing diagnostics/treatment plan with the patient/family.  Electronically signed by:   Rosiland Oz, MD Infectious Disease Physician [facility] Pager: [phone]

## 2021-02-01 LAB — CBC
HCT: 33 % — ABNORMAL LOW (ref 36.0–46.0)
Hemoglobin: 10.5 g/dL — ABNORMAL LOW (ref 12.0–15.0)
MCH: 29.1 pg (ref 26.0–34.0)
MCHC: 31.8 g/dL (ref 30.0–36.0)
MCV: 91.4 fL (ref 80.0–100.0)
Platelets: 170 10*3/uL (ref 150–400)
RBC: 3.61 MIL/uL — ABNORMAL LOW (ref 3.87–5.11)
RDW: 19.4 % — ABNORMAL HIGH (ref 11.5–15.5)
WBC: 22.7 10*3/uL — ABNORMAL HIGH (ref 4.0–10.5)
nRBC: 0 % (ref 0.0–0.2)

## 2021-02-01 LAB — BASIC METABOLIC PANEL
Anion gap: 9 (ref 5–15)
BUN: 11 mg/dL (ref 8–23)
CO2: 25 mmol/L (ref 22–32)
Calcium: 8.5 mg/dL — ABNORMAL LOW (ref 8.9–10.3)
Chloride: 101 mmol/L (ref 98–111)
Creatinine, Ser: 0.45 mg/dL (ref 0.44–1.00)
GFR, Estimated: >60 mL/min (ref >60)
Glucose, Bld: 90 mg/dL (ref 70–99)
Potassium: 3.9 mmol/L (ref 3.5–5.1)
Sodium: 135 mmol/L (ref 135–145)

## 2021-02-01 MED ORDER — SODIUM CHLORIDE 0.9 % IV SOLN
3.0000 g | Freq: Four times a day (QID) | INTRAVENOUS | Status: DC
  Administered 2021-02-01 – 2021-02-02 (×4): 3 g via INTRAVENOUS
  Filled 2021-02-01 (×5): qty 8

## 2021-02-01 MED ORDER — SPIRONOLACTONE 25 MG PO TABS
100.0000 mg | ORAL_TABLET | Freq: Every day | ORAL | Status: DC
  Administered 2021-02-02: 100 mg via ORAL
  Filled 2021-02-01: qty 4

## 2021-02-01 MED ORDER — FUROSEMIDE 40 MG PO TABS
40.0000 mg | ORAL_TABLET | Freq: Every day | ORAL | Status: DC
  Administered 2021-02-02: 40 mg via ORAL
  Filled 2021-02-01: qty 1

## 2021-02-01 MED ORDER — ACETAMINOPHEN 500 MG PO TABS
1000.0000 mg | ORAL_TABLET | Freq: Three times a day (TID) | ORAL | Status: DC | PRN
  Administered 2021-02-01: 1000 mg via ORAL
  Filled 2021-02-01: qty 2

## 2021-02-01 NOTE — Progress Notes (Signed)
Pharmacy Antibiotic Note  Savannah Hobbs is a 75 y.o. female admitted on 01/17/2021 with  strangulated piece of omentum in her hernia .  Pharmacy has been consulted for Unasyn dosing.  Current creatinine clearance is 68.9 ml/min - no dose adjustment needed for the Unasyn  Plan: Discontinue daptomycin and piperacillin-tazobactam Start Unasyn 3g every 6 hours  Height: 5\' 5"  (165.1 cm) Weight: 86.2 kg (190 lb 1.6 oz) IBW/kg (Calculated) : 57  Temp (24hrs), Avg:98.3 F (36.8 C), Min:98.2 F (36.8 C), Max:98.4 F (36.9 C)  Recent Labs  Lab 01/28/21 0402 01/29/21 0321 01/30/21 0411 01/31/21 0450 02/01/21 0501  WBC 34.5* 32.4* 28.3* 33.2* 22.7*  CREATININE 0.45 0.41* 0.41* 0.41* 0.45    Estimated Creatinine Clearance: 68.9 mL/min (by C-G formula based on SCr of 0.45 mg/dL).    Allergies  Allergen Reactions   Shellfish Allergy Hives    Antimicrobials this admission: Ceftriaxone 10/29 >> 10/30 Vanc 10/29 >> 10/30 Zosyn 10/30 >> 11/12 Dapto 11/3 >> 11/12 Unasyn 11/12 >>   Microbiology results: 10/29 BCx: ngF 10/29 MRSA PCR: neg 11/9 left abscess umbilicus cx: rare E. Coli - pan sensitive  Thank you for allowing pharmacy to be a part of this patient's care.  Zenaida Deed, PharmD PGY1 Acute Care Pharmacy Resident  Phone: [phone] 02/01/2021  3:18 PM  Please check [email or web address] for unit-specific pharmacy phone numbers.

## 2021-02-01 NOTE — Progress Notes (Signed)
Id brief note  11/09 fluid aspirate cx ecoli pansensitive Wbc improving on abx Reviewed dr Levonne Spiller recommendation   -stop piptazo -start amp-sulbactam -if continued improvement, could dc on amox-clav for 2 more weeks -await repeat ct abd/pelv -will have her follow up with dr [place] in around 3 weeks -- appointment made

## 2021-02-01 NOTE — Plan of Care (Signed)

## 2021-02-01 NOTE — Plan of Care (Signed)
  Problem: Education: Goal: Knowledge of General Education information will improve Description: Including pain rating scale, medication(s)/side effects and non-pharmacologic comfort measures Outcome: Progressing   Problem: Health Behavior/Discharge Planning: Goal: Ability to manage health-related needs will improve Outcome: Progressing   Problem: Clinical Measurements: Goal: Ability to maintain clinical measurements within normal limits will improve Outcome: Progressing Goal: Will remain free from infection Outcome: Progressing Goal: Diagnostic test results will improve Outcome: Progressing Goal: Respiratory complications will improve Outcome: Progressing Goal: Cardiovascular complication will be avoided Outcome: Progressing   Problem: Nutrition: Goal: Adequate nutrition will be maintained Outcome: Progressing   Problem: Coping: Goal: Level of anxiety will decrease Outcome: Progressing   Problem: Elimination: Goal: Will not experience complications related to bowel motility Outcome: Progressing Goal: Will not experience complications related to urinary retention Outcome: Progressing   Problem: Pain Managment: Goal: General experience of comfort will improve Outcome: Progressing   Problem: Safety: Goal: Ability to remain free from injury will improve Outcome: Progressing   Problem: Skin Integrity: Goal: Risk for impaired skin integrity will decrease Outcome: Progressing   Problem: Education: Goal: Knowledge of General Education information will improve Description: Including pain rating scale, medication(s)/side effects and non-pharmacologic comfort measures Outcome: Progressing   Problem: Health Behavior/Discharge Planning: Goal: Ability to manage health-related needs will improve Outcome: Progressing   Problem: Clinical Measurements: Goal: Ability to maintain clinical measurements within normal limits will improve Outcome: Progressing Goal: Will remain  free from infection Outcome: Progressing Goal: Diagnostic test results will improve Outcome: Progressing Goal: Respiratory complications will improve Outcome: Progressing Goal: Cardiovascular complication will be avoided Outcome: Progressing   Problem: Activity: Goal: Risk for activity intolerance will decrease Outcome: Progressing   Problem: Nutrition: Goal: Adequate nutrition will be maintained Outcome: Progressing   Problem: Coping: Goal: Level of anxiety will decrease Outcome: Progressing   Problem: Elimination: Goal: Will not experience complications related to bowel motility Outcome: Progressing Goal: Will not experience complications related to urinary retention Outcome: Progressing   Problem: Pain Managment: Goal: General experience of comfort will improve Outcome: Progressing   Problem: Safety: Goal: Ability to remain free from injury will improve Outcome: Progressing   Problem: Skin Integrity: Goal: Risk for impaired skin integrity will decrease Outcome: Progressing   Problem: Clinical Measurements: Goal: Ability to avoid or minimize complications of infection will improve Outcome: Progressing   Problem: Skin Integrity: Goal: Skin integrity will improve Outcome: Progressing

## 2021-02-01 NOTE — Progress Notes (Signed)
HD#13 Subjective:  Overnight Events: No events  Patient seen and assessed at bedside.  Reports feeling well. Pain is very well controlled. No SOB. No dizziness and lightheaded with positional change. Reports good urine output overnight. Patient was delighted to hear her WBC count had gone down to 22 this morning.  Discussed plan with patient to continue to monitor patient's white blood cell count as well as clinical progression.  Discussed infectious disease recommendations to continue IV antibiotics until appropriate cultures have been obtained and the periumbilical area has significant less induration.  Patient verbalized agreement and understanding.  Patient had no other questions at this time.    Objective:  Vital signs in last 24 hours: Vitals:   01/31/21 0906 01/31/21 1053 01/31/21 2019 02/01/21 0406  BP: (!) 124/42 (!) 126/54 (!) 128/47 (!) 127/35  Pulse: 92 85 86 81  Resp: 18 18 18 18   Temp: 99 F (37.2 C) 99 F (37.2 C) 98.4 F (36.9 C) 98.2 F (36.8 C)  TempSrc: Oral Oral Oral Oral  SpO2: 97% 95% 98% 93%  Weight:      Height:       Supplemental O2: Room Air SpO2: 93 %   Physical Exam:  Physical Exam Constitutional:      General: She is not in acute distress.    Appearance: She is not ill-appearing.  HENT:     Head: Normocephalic.  Pulmonary:     Effort: Pulmonary effort is normal. No respiratory distress.  Abdominal:     General: Bowel sounds are normal. There is distension.     Tenderness: There is no abdominal tenderness.     Comments: Similar erythema across the lower abdomen.  Mild tenderness to palpation.  Musculoskeletal:     Right lower leg: 1+ Pitting Edema present.     Left lower leg: 1+ Pitting Edema present.  Skin:    General: Skin is warm.  Neurological:     Mental Status: She is alert and oriented to person, place, and time.  Psychiatric:        Mood and Affect: Mood normal.        Behavior: Behavior normal.    Filed Weights    01/29/21 0007 01/30/21 0445 01/31/21 0419  Weight: 90.4 kg 88.3 kg 87.4 kg     Intake/Output Summary (Last 24 hours) at 02/01/2021 0555 Last data filed at 02/01/2021 0400 Gross per 24 hour  Intake 1156.6 ml  Output 3200 ml  Net -2043.4 ml    Net IO Since Admission: -14,718.48 mL [02/01/21 0555]  Pertinent Labs: CBC Latest Ref Rng & Units 01/31/2021 01/30/2021 01/29/2021  WBC 4.0 - 10.5 K/uL 33.2(H) 28.3(H) 32.4(H)  Hemoglobin 12.0 - 15.0 g/dL 11.3(L) 10.3(L) 10.6(L)  Hematocrit 36.0 - 46.0 % 34.2(L) 31.7(L) 33.0(L)  Platelets 150 - 400 K/uL 232 194 214    CMP Latest Ref Rng & Units 01/31/2021 01/30/2021 01/29/2021  Glucose 70 - 99 mg/dL 91 85 81  BUN 8 - 23 mg/dL 7(L) 7(L) 6(L)  Creatinine 0.44 - 1.00 mg/dL 0.41(L) 0.41(L) 0.41(L)  Sodium 135 - 145 mmol/L 135 137 135  Potassium 3.5 - 5.1 mmol/L 3.9 3.8 4.0  Chloride 98 - 111 mmol/L 101 102 100  CO2 22 - 32 mmol/L 26 29 25   Calcium 8.9 - 10.3 mg/dL 8.6(L) 8.3(L) 8.1(L)  Total Protein 6.5 - 8.1 g/dL - - -  Total Bilirubin 0.3 - 1.2 mg/dL - - -  Alkaline Phos 38 - 126 U/L - - -  AST 15 - 41 U/L - - -  ALT 0 - 44 U/L - - -    Imaging: No results found.  Assessment/Plan:   Active Problems:   Decompensated hepatic cirrhosis (HCC)   Abdominal wall cellulitis   Periumbilical hernia   Other ascites   Periumbilical abdominal pain   NASH (nonalcoholic steatohepatitis)   Myeloproliferative disease (HCC)   Thrombocytopenia (HCC)   Abdominal fluid collection   Abscess   Patient Summary: Savannah Hobbs is a 75 y.o. with pertinent PMH of PAF, Hiatal hernia, cirrhosis, and GERD who presented with abdominal pain and admit for treatment of abdominal wall cellulitis secondary to strangulated umbilical hernia.  #Abdominal wall cellulitis secondary to strangulated umbilical hernia #Periumbilical abscess s/p IR aspiration  Patient's erythema, swelling does seem to have regressed.  Patient's umbilicus appears progressively  less swollen today.  Periumbilical purulent fluid has been aspirated and sent to lab for cultures.  White blood cell count down to 22.7 today which patient reports is closer to her baseline.  CRP is also downtrending.  -Continue IV daptomycin and Zosyn -Pain is well controlled.  Patient reports not needing as needed oxycodone so switched scheduled Tylenol to as needed as well. -Bowel regimen in place -Periumbilical purulent aspirate culture final report pending.  Rare gram-negative rods were identified on culture with identification and susceptibilities to follow.  No anaerobes isolated. -Infectious disease following, greatly appreciate recommendations -Per infectious disease, patient still requires a few more days of IV antibiotics   Decompensated Cirrhosis with Portal Hypertension Ascites Thrombocytopenia In the setting of medication use for knee pain. Confirmed by Dr. Carlean Purl in 2018.  Volume status stable today - IV Zosyn for likely peritonitis -Resume home dose of Lasix 40 mg daily -Reduce spironolactone 100 mg daily to match lasix dosage  Paroxysmal Atrial Fibrillation S/P Ablation 09/26/2020 CHA2DS2-VASc score of 2, on Eliquis 5 mg twice daily and flecainide 200 mg as needed daily.  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  Not currently on treatment, monitored by her oncologist.   Full code IV fluid: None DVT: Eliquis Diet: Heart  Prior to Admission Living Arrangement: Home Anticipated Discharge Location: Home Barriers to Discharge: Further medical treatment Dispo: Pending purulent fluid culture  France Ravens, MD 02/01/2021, 5:55 AM Pager: [phone]  Please contact the on call pager after 5 pm and on weekends at [phone].

## 2021-02-02 DIAGNOSIS — L02216 Cutaneous abscess of umbilicus: Secondary | ICD-10-CM

## 2021-02-02 LAB — CBC
HCT: 33 % — ABNORMAL LOW (ref 36.0–46.0)
Hemoglobin: 10.6 g/dL — ABNORMAL LOW (ref 12.0–15.0)
MCH: 29.5 pg (ref 26.0–34.0)
MCHC: 32.1 g/dL (ref 30.0–36.0)
MCV: 91.9 fL (ref 80.0–100.0)
Platelets: 165 10*3/uL (ref 150–400)
RBC: 3.59 MIL/uL — ABNORMAL LOW (ref 3.87–5.11)
RDW: 19.5 % — ABNORMAL HIGH (ref 11.5–15.5)
WBC: 23.2 10*3/uL — ABNORMAL HIGH (ref 4.0–10.5)
nRBC: 0 % (ref 0.0–0.2)

## 2021-02-02 LAB — BASIC METABOLIC PANEL
Anion gap: 8 (ref 5–15)
BUN: 8 mg/dL (ref 8–23)
CO2: 25 mmol/L (ref 22–32)
Calcium: 8.2 mg/dL — ABNORMAL LOW (ref 8.9–10.3)
Chloride: 101 mmol/L (ref 98–111)
Creatinine, Ser: 0.38 mg/dL — ABNORMAL LOW (ref 0.44–1.00)
GFR, Estimated: >60 mL/min (ref >60)
Glucose, Bld: 89 mg/dL (ref 70–99)
Potassium: 3.7 mmol/L (ref 3.5–5.1)
Sodium: 134 mmol/L — ABNORMAL LOW (ref 135–145)

## 2021-02-02 MED ORDER — SPIRONOLACTONE 100 MG PO TABS: 100.0000 mg | ORAL_TABLET | Freq: Every day | ORAL | 0 refills | Status: DC

## 2021-02-02 MED ORDER — AMOXICILLIN-POT CLAVULANATE 875-125 MG PO TABS: 1.0000 | ORAL_TABLET | Freq: Two times a day (BID) | ORAL | 0 refills | Status: AC

## 2021-02-02 NOTE — Discharge Summary (Signed)
Name: Srija Southard MRN: [id number] DOB: 25-Aug-1945 75 y.o. PCP: Moshe Cipro, MD  Date of Admission: 01/17/2021  9:38 PM Date of Discharge: 02/02/2021 Attending Physician: Axel Filler, *  Discharge Diagnosis: 1.  Omental umbilical hernia 2.  Abdominal wall cellulitis 3.  Periumbilical abscess 4.  Cirrhosis  Discharge Medications: Allergies as of 02/02/2021       Reactions   Shellfish Allergy Hives        Medication List     STOP taking these medications    potassium chloride SA 20 MEQ tablet Commonly known as: KLOR-CON       TAKE these medications    acetaminophen 500 MG tablet Commonly known as: TYLENOL Take 500 mg by mouth every 6 (six) hours as needed for moderate pain.   amoxicillin-clavulanate 875-125 MG tablet Commonly known as: Augmentin Take 1 tablet by mouth 2 (two) times daily for 14 days.   apixaban 5 MG Tabs tablet Commonly known as: ELIQUIS Take 1 tablet (5 mg total) by mouth 2 (two) times daily.   atorvastatin 40 MG tablet Commonly known as: LIPITOR Take 40 mg by mouth daily. What changed: Another medication with the same name was removed. Continue taking this medication, and follow the directions you see here.   cetirizine 10 MG tablet Commonly known as: ZYRTEC Take 10 mg by mouth in the morning.   docusate sodium 100 MG capsule Commonly known as: COLACE Take 100 mg by mouth 2 (two) times daily.   esomeprazole 20 MG capsule Commonly known as: NEXIUM Take 20 mg by mouth daily before breakfast.   ferrous sulfate 325 (65 FE) MG tablet Take 325 mg by mouth daily.   flecainide 100 MG tablet Commonly known as: TAMBOCOR Take 2 tablets (200 mg total) by mouth as directed. Take 2 tablets as needed daily for Afib What changed:  when to take this reasons to take this additional instructions   furosemide 40 MG tablet Commonly known as: Lasix Take 1 tablet (40 mg total) by mouth daily.   ibuprofen 200 MG  tablet Commonly known as: ADVIL Take 200 mg by mouth every 6 (six) hours as needed for mild pain (or headaches).   spironolactone 100 MG tablet Commonly known as: ALDACTONE Take 1 tablet (100 mg total) by mouth daily. Start taking on: February 03, 2021   vitamin B-12 1000 MCG tablet Commonly known as: CYANOCOBALAMIN Take 1,000 mcg by mouth daily.   vitamin C 1000 MG tablet Take 1,000 mg by mouth daily.   Vitamin D-3 125 MCG (5000 UT) Tabs Take 5,000 Units by mouth daily.        Disposition and follow-up:   Ms.Raechelle Aeliana Spates was discharged from [facility] in Stable condition.  At the hospital follow up visit please address:  1.   Omental umbilical hernia Abdominal wall cellulitis Periumbilical abscess Patient has been on a long course of IV antibiotic including daptomycin and Zosyn.  No surgical intervention was indicated due to high risk of mortality with her underlying cirrhosis.  The process culture grew pansensitive E. coli.  Patient will be transition to Augmentin for 2 weeks after discharge. -Please reassess her abdominal cellulitis and hernia  Cirrhosis Spironolactone was added to help with diuresis in the setting of cirrhosis.  The dose was originally increased to 150 mg spironolactone and 60 mg Lasix.  We then cut back to 100/40 mg due to low blood pressure.  She will be discharged with this regimen. -Stop home  potassium supplement -Assess volume status.  Please adjust dose if appropriate -Repeat BMP  2.  Labs / imaging needed at time of follow-up: CBC. BMP  3.  Pending labs/ test needing follow-up: NA  Follow-up Appointments:  Follow-up Information     [facility]. Schedule an appointment as soon as possible for a visit in 2 week(s).   Contact information: [address]  [place] [phone]        Moshe Cipro, MD Follow up.   Specialty: Internal Medicine Contact  information: [place] [address] [phone]         Donato Heinz, MD .   Specialties: Cardiology, Radiology Contact information: [address] [place] [address] [phone]         Thompson Grayer, MD .   Specialty: Cardiology Contact information: [place] [address] [place] Hospital Course by problem list: Patient Summary  Jannatul Wojdyla is a 75 y.o. with a history of PAF, hiatal hernia, cirrhosis, and myeloproliferative neoplasm who was admitted for treatment of abdominal wall cellulitis and strangulated umbilical hernia. Hospital course is outlined below.    #Abdominal Wall Cellulitis #Strangulated Periumbilical Hernia #Dehydration Patient presented to the ED on 10/28 with mild elevated temperature of 100.9 F, leukocytosis, lactic acid of 1.8, and strangulated omental hernia confirmed on CT imaging, with no bowel involvement. General surgery consulted in ED recommending outpatient follow-up, no indications for surgery. She was also noted to have nonpurulent abdominal wall cellulitis.  She was originally started on IV Zosyn for intra-abdominal infection.  However her white blood count and abdominal wall erythema continue to worsen. ID was consulted and recommended addition of IV daptomycin.  Repeat CT showed 6.2 cm loculated fluid collection in the left paraumbilical region. Surgery had an extensive discussion with patient and family, who decided not to proceed with surgical intervention due to high risk of mortality.  The abscess was drained by IR which grew pansensitive E. coli.  Patient was clinically stable on the day of discharge.  Her white blood count has normalized to her normal baseline. She was transitioned to a 14-day course of oral Augmentin.  She will follow-up with infectious disease physician in 3 weeks.  #Cirrhosis with Portal  Hypertension #Thrombocytopenia Patient appeared volume overloaded on exam, raising the concern of decompensated cirrhosis in the setting of abdominal wall infection. Patient's Lasix was increased to 60mg  and spironolactone 150mg  daily was added.  The dose was reduced to 100 mg spironolactone and 40 mg Lasix due to low blood pressure.  Patient will be discharged with spironolactone.  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  Not currently on treatment, monitored by her oncologist. WBC peaked on 11/4 to 44.6, and began to down trend. On day of discharge, WBC had stabilized to 23.2.   Follow-up  Patient was instructed to follow-up with her PCP in [place] in the next week and has an appointment scheduled with Dr. West Bali from ID on 12/6 at 10:30 AM.    Discharge Exam:   BP (!) 147/46 (BP Location: Right Arm)   Pulse 87   Temp 98.3 F (36.8 C) (Oral)  Resp 16   Ht 5\' 5"  (1.651 m)   Wt 85.8 kg   SpO2 97%   BMI 31.47 kg/m  Discharge exam:  Physical Exam Constitutional:      General: She is not in acute distress.    Appearance: She is not ill-appearing or toxic-appearing.  HENT:     Head: Normocephalic.  Eyes:     General:        Right eye: No discharge.        Left eye: No discharge.     Conjunctiva/sclera: Conjunctivae normal.  Cardiovascular:     Rate and Rhythm: Normal rate and regular rhythm.     Heart sounds: Murmur (Systolic murmur) heard.     Comments: +1 edema bilateral lower extremity Pulmonary:     Effort: Pulmonary effort is normal. No respiratory distress.  Abdominal:     Comments: Marked improvement of abdominal wall erythema.  No pain to palpation.  Umbilical hernia protrusion also improved.  Skin:    General: Skin is warm.     Coloration: Skin is not jaundiced.   Neurological:     General: No focal deficit present.     Mental Status: She is alert and oriented to person, place, and time.  Psychiatric:        Mood and Affect: Mood normal.        Behavior: Behavior normal.     Pertinent Labs, Studies, and Procedures:  CT ABDOMEN PELVIS WO CONTRAST  Result Date: 01/18/2021 CLINICAL DATA:  Abdominal pain with hernia suspected EXAM: CT ABDOMEN AND PELVIS WITHOUT CONTRAST TECHNIQUE: Multidetector CT imaging of the abdomen and pelvis was performed following the standard protocol without IV contrast. COMPARISON:  None. FINDINGS: Lower chest:  Borderline septal thickening at the bases. Hepatobiliary: Cirrhotic liver morphology with recanalized umbilical vein and splenomegaly.Cholelithiasis. No evidence of cholecystitis. Pancreas: Generalized atrophy Spleen: Enlarged with 22 cm craniocaudal span. Adrenals/Urinary Tract: Negative adrenals. No hydronephrosis or ureteral calculi. Two punctate left renal calculi. Depressed left kidney due to splenomegaly. Unremarkable bladder. Stomach/Bowel:  No obstruction. No bowel wall thickening. Vascular/Lymphatic: Varices in the upper abdomen. Atheromatous calcification of the aorta and branch vessels Reproductive:Hysterectomy. Other: Midline hernia containing ascitic fluid and congested fat. Diffuse subcutaneous edema of the anterior abdominal wall. Musculoskeletal: Osteopenia and lumbar spine degeneration. No emergent finding. IMPRESSION: 1. Large periumbilical hernia containing congested fat and ascitic fluid. 2. Anterior abdominal wall edema suggesting cellulitis. 3. Cirrhosis with portal hypertension. 4. Cholelithiasis and left nephrolithiasis. Electronically Signed   By: Jorje Guild M.D.   On: 01/18/2021 08:19     CT abd/pelvis 01/24/2021 IMPRESSION: There is no evidence of intestinal obstruction or pneumoperitoneum. There is no hydronephrosis.   Cirrhosis of liver. Small ascites. Portal hypertension. Splenomegaly.  Gallbladder stone. Small left renal stones.   There is 6.2 cm loculated fluid collection in the left paraumbilical region with no significant interval change. Differential diagnostic possibilities would include entrapment of ascitic fluid in the paraumbilical hernia or resolving hematoma or an abscess. There is edema in subcutaneous plane, especially in the left lower and left mid abdomen suggesting contusion or cellulitis. There is slight worsening of this finding.  CBC Latest Ref Rng & Units 02/02/2021 02/01/2021 01/31/2021  WBC 4.0 - 10.5 K/uL 23.2(H) 22.7(H) 33.2(H)  Hemoglobin 12.0 - 15.0 g/dL 10.6(L) 10.5(L) 11.3(L)  Hematocrit 36.0 - 46.0 % 33.0(L) 33.0(L) 34.2(L)  Platelets 150 - 400 K/uL 165 170 232   CMP Latest Ref Rng & Units 02/02/2021 02/01/2021 01/31/2021  Glucose  70 - 99 mg/dL 89 90 91  BUN 8 - 23 mg/dL 8 11 7(L)  Creatinine 0.44 - 1.00 mg/dL 0.38(L) 0.45 0.41(L)  Sodium 135 - 145 mmol/L 134(L) 135 135  Potassium 3.5 - 5.1 mmol/L 3.7 3.9 3.9  Chloride 98 - 111 mmol/L 101 101 101  CO2 22 - 32 mmol/L 25 25 26   Calcium 8.9 - 10.3 mg/dL 8.2(L) 8.5(L) 8.6(L)  Total Protein 6.5 - 8.1 g/dL - - -  Total Bilirubin 0.3 - 1.2 mg/dL - - -  Alkaline Phos 38 - 126 U/L - - -  AST 15 - 41 U/L - - -  ALT 0 - 44 U/L - - -     Discharge Instructions: Discharge Instructions     Call MD for:  persistant nausea and vomiting   Complete by: As directed    Call MD for:  severe uncontrolled pain   Complete by: As directed    Call MD for:  temperature >100.4   Complete by: As directed    Diet - low sodium heart healthy   Complete by: As directed    Discharge instructions   Complete by: As directed    Ms. Kannan,  It was a pleasure taking care of you during this admission.  You were hospitalized due to an omental umbilical hernia, complicated by abdominal wall cellulitis.  No surgical intervention was implemented.  We started you on IV antibiotics and drained an abscess.  The abscess  culture grew E. coli, which is a common bacteria of your guts.  We will continue 2 more weeks of Augmentin after discharge.  You will follow-up with the infectious disease doctor in 3 weeks.  We also added a medication called spironolactone to help with diuresis in the setting of your cirrhosis.  This medication can help balance out the potassium so you no longer need to take the potassium supplement.  Please continue taking Lasix 40 mg at home.  Please follow-up with your primary care doctor.  Take care, Dr. Alfonse Spruce   Increase activity slowly   Complete by: As directed        Signed: Gaylan Gerold, DO 02/02/2021, 11:50 AM   Pager: [phone]

## 2021-02-03 LAB — AEROBIC/ANAEROBIC CULTURE W GRAM STAIN (SURGICAL/DEEP WOUND)

## 2021-02-05 ENCOUNTER — Other Ambulatory Visit: Payer: Self-pay | Admitting: Cardiology

## 2021-02-05 NOTE — Telephone Encounter (Signed)
This is Dr. Schumann's pt 

## 2021-02-10 ENCOUNTER — Telehealth: Payer: Self-pay | Admitting: Cardiology

## 2021-02-10 NOTE — Telephone Encounter (Signed)
Pt would like to know if Dr. Gardiner Rhyme can complete the [place] then mail the completed paperwork to the pt for her to complete her portion

## 2021-02-10 NOTE — Telephone Encounter (Signed)
Returned call to patient-advised would complete MD portion and place in the mail.

## 2021-02-17 NOTE — Telephone Encounter (Signed)
Patient calling back to follow up.

## 2021-02-17 NOTE — Telephone Encounter (Signed)
Pt is returning call to [facility]

## 2021-02-17 NOTE — Telephone Encounter (Signed)
Returned call to patient-spoke to husband (ok per DPR) advised MD portion mailed out last week.    Advised to call back if not received.

## 2021-02-18 NOTE — Telephone Encounter (Signed)
Savannah Hobbs is calling back stating she did not receive the letter in the mail. She is requesting another form be mailed out and sent in the mail. Confirmed address is correct would also like a callback from [facility].

## 2021-02-19 NOTE — Telephone Encounter (Signed)
Left message to call back  Copy remailed Copy also faxed to [facility]

## 2021-02-19 NOTE — Telephone Encounter (Signed)
Spoke to patient, aware copy remailed.

## 2021-02-19 NOTE — Telephone Encounter (Signed)
  Pt is returning call, provide info from notes both copies remailed and faxed to [facility] but pt said she still wants a cb from [facility]

## 2021-02-21 ENCOUNTER — Ambulatory Visit (INDEPENDENT_AMBULATORY_CARE_PROVIDER_SITE_OTHER): Payer: Medicare Other | Admitting: Infectious Diseases

## 2021-02-21 ENCOUNTER — Other Ambulatory Visit: Payer: Self-pay

## 2021-02-21 ENCOUNTER — Other Ambulatory Visit: Payer: Self-pay | Admitting: Cardiology

## 2021-02-21 VITALS — BP 150/74 | HR 60 | Wt 178.0 lb

## 2021-02-21 DIAGNOSIS — Z5181 Encounter for therapeutic drug level monitoring: Secondary | ICD-10-CM

## 2021-02-21 DIAGNOSIS — L0291 Cutaneous abscess, unspecified: Secondary | ICD-10-CM | POA: Diagnosis not present

## 2021-02-21 DIAGNOSIS — L03311 Cellulitis of abdominal wall: Secondary | ICD-10-CM

## 2021-02-21 NOTE — Progress Notes (Signed)
[facility]                                                             [place], [place], [place], [address]                                                                  Phn. [phone]; Fax: [phone]                                                                             Date: 02/21/21  Reason for Referral: HFU for incarcerated abdominal hernia and abdominal wall cellulitis    Assessment Incarcerated umbilical hernia containing omentum - s/p IR guided aspiration of complex left periumbilical fluid collection yielding 10 mL of purulent fluid. 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   All questions and concerns were discussed and addressed. Patient verbalized understanding of the plan. ____________________________________________________________________________________________________________________ Subjective/Interval Events 02/21/21 Here for HFU in the setting of incarcerated umbilical hernia/abdominal wall cellulitis and abscess. She was discharged on 02/12/21 after prolonged IV abtx on Augmentin for 2 weeks. Underwent US guided aspiration of left complex periumbilical fluid collection. Umbilical abscess cultures grew E coli on 11/9. Accompanied by her husband. Finished abtx on Sunday. CBC at PCP on Monday 17. They say her WBC always run on the higher end because of MDS. Appetite is improving. Denies nausea, vomiting, abdominal pain. BMs are regular. Cancelled appt with surgery  which was around end of dec as she thinks she does not needs surgery anymore.   ROS: Constitutional: Negative for fever, chills, activity change, fatigue and unexpected weight change.  Respiratory: Negative for cough, shortness of breath Cardiovascular: Negative for chest pain, palpitations and leg swelling.  Gastrointestinal: Negative for nausea, vomiting, abdominal pain, diarrhea/constipation, .  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    Elevated cholesterol    Erosive esophagitis 1995   Fatty liver    GERD (gastroesophageal reflux disease)    Hiatal hernia    LARGE   Hx of colonic  polyps 05/27/2018   Hx of transfusion of platelets    Paroxysmal atrial fibrillation ([facility])    Thrombocytopenia ([facility])    Typical atrial flutter ([facility])    UTI (lower urinary tract infection)    Past Surgical History:  Procedure Laterality Date   ABDOMINAL HYSTERECTOMY     partial   APPENDECTOMY     ATRIAL FIBRILLATION ABLATION N/A 09/26/2020   Procedure: ATRIAL FIBRILLATION ABLATION;  Surgeon: Thompson Grayer, MD;  Location: [facility];  Service: Cardiovascular;  Laterality: N/A;   COLONOSCOPY     ESOPHAGOGASTRODUODENOSCOPY     GYNECOLOGIC CRYOSURGERY     x2   IR US GUIDE BX ASP/DRAIN  01/29/2021   REPLACEMENT TOTAL KNEE Left 2016   TONSILLECTOMY     TOTAL KNEE ARTHROPLASTY Right    TUBAL LIGATION     UPPER GASTROINTESTINAL ENDOSCOPY     Current Outpatient Medications on File Prior to Visit  Medication Sig Dispense Refill   acetaminophen (TYLENOL) 500 MG tablet Take 500 mg by mouth every 6 (six) hours as needed for moderate pain.     apixaban  (ELIQUIS) 5 MG TABS tablet Take 1 tablet (5 mg total) by mouth 2 (two) times daily. 180 tablet 1   Ascorbic Acid (VITAMIN C) 1000 MG tablet Take 1,000 mg by mouth daily.     atorvastatin (LIPITOR) 40 MG tablet Take 40 mg by mouth daily.     cetirizine (ZYRTEC) 10 MG tablet Take 10 mg by mouth in the morning.     Cholecalciferol (VITAMIN D-3) 125 MCG (5000 UT) TABS Take 5,000 Units by mouth daily.     docusate sodium (COLACE) 100 MG capsule Take 100 mg by mouth 2 (two) times daily.     esomeprazole (NEXIUM) 20 MG capsule Take 20 mg by mouth daily before breakfast.     ferrous sulfate 325 (65 FE) MG tablet Take 325 mg by mouth daily.     flecainide (TAMBOCOR) 100 MG tablet Take 2 tablets (200 mg total) by mouth as directed. Take 2 tablets as needed daily for Afib (Patient taking differently: Take 200 mg by mouth daily as needed (a-fib).) 10 tablet 1   furosemide (LASIX) 40 MG tablet TAKE 1 TABLET BY MOUTH EVERY DAY 90 tablet 3   ibuprofen (ADVIL) 200 MG tablet Take 200 mg by mouth every 6 (six) hours as needed for mild pain (or headaches).     spironolactone (ALDACTONE) 100 MG tablet Take 1 tablet (100 mg total) by mouth daily. 30 tablet 0   vitamin B-12 (CYANOCOBALAMIN) 1000 MCG tablet Take 1,000 mcg by mouth daily.     No current facility-administered medications on file prior to visit.   Allergies  Allergen Reactions   Shellfish Allergy Hives   Social History   Socioeconomic History   Marital status: Married    Spouse name: Not on file   Number of children: 3   Years of education: Not on file   Highest education level: Not on file  Occupational History   Occupation: retired Therapist, sports  Tobacco Use   Smoking status: Never   Smokeless tobacco: Never  Substance and Sexual Activity   Alcohol use: Yes    Alcohol/week: 1.0 standard drink    Types: 1 Glasses of wine per week   Drug use: No   Sexual activity: Not on file  Other Topics Concern   Not on file  Social History Narrative    Married, 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  3 sons, August 2017 1 son died in his sleep unclear cause   Updated 12/02/2015   Social Determinants of Health   Financial Resource Strain: Not on file  Food Insecurity: Not on file  Transportation Needs: Not on file  Physical Activity: Not on file  Stress: Not on file  Social Connections: Not on file  Intimate Partner Violence: Not on file   Family History  Problem Relation Age of Onset   Esophageal cancer Father    Hypertension Mother    Colon polyps Neg Hx    Colon cancer Neg Hx    Rectal cancer Neg Hx    Stomach cancer Neg Hx     Vitals BP (!) 150/74   Pulse 60   Wt 178 lb (80.7 kg)   BMI 29.62 kg/m    Examination  General - not in acute distress, comfortably sitting in chair HEENT - PEERLA, no pallor and no icterus Chest - b/l clear air entry, no additional sounds CVS- Normal s1s2, RRR Abdomen - Soft, Non tender , non distended, BS+, dark discoloration of the periumbilical area where previously it was red ( erythema has resolved)    Neuro- awake, alert and oriented, grossly non focal  Psych : calm and cooperative  Recent labs CBC Latest Ref Rng & Units 02/02/2021 02/01/2021 01/31/2021  WBC 4.0 - 10.5 K/uL 23.2(H) 22.7(H) 33.2(H)  Hemoglobin 12.0 - 15.0 g/dL 10.6(L) 10.5(L) 11.3(L)  Hematocrit 36.0 - 46.0 % 33.0(L) 33.0(L) 34.2(L)  Platelets 150 - 400 K/uL 165 170 232   CMP Latest Ref Rng & Units 02/02/2021 02/01/2021 01/31/2021  Glucose 70 - 99 mg/dL 89 90 91  BUN 8 - 23 mg/dL 8 11 7(L)  Creatinine 0.44 - 1.00 mg/dL 0.38(L) 0.45 0.41(L)  Sodium 135 - 145 mmol/L 134(L) 135 135  Potassium 3.5 - 5.1 mmol/L 3.7 3.9 3.9  Chloride 98 - 111 mmol/L 101 101 101  CO2 22 - 32 mmol/L 25 25 26   Calcium 8.9 - 10.3 mg/dL 8.2(L) 8.5(L) 8.6(L)  Total Protein 6.5 - 8.1 g/dL - - -  Total Bilirubin 0.3 - 1.2 mg/dL - - -  Alkaline Phos 38 - 126 U/L - - -  AST 15 - 41 U/L - - -  ALT 0  - 44 U/L - - -     Pertinent Microbiology Results for orders placed or performed during the hospital encounter of 01/17/21  Resp Panel by RT-PCR (Flu A&B, Covid) Nasopharyngeal Swab     Status: None   Collection Time: 01/18/21  4:40 AM   Specimen: Nasopharyngeal Swab; Nasopharyngeal(NP) swabs in vial transport medium  Result Value Ref Range Status   SARS Coronavirus 2 by RT PCR NEGATIVE NEGATIVE Final    Comment: (NOTE) SARS-CoV-2 target nucleic acids are NOT DETECTED.  The SARS-CoV-2 RNA is generally detectable in upper respiratory specimens during the acute phase of infection. The lowest concentration of SARS-CoV-2 viral copies this assay can detect is 138 copies/mL. A negative result does not preclude SARS-Cov-2 infection and should not be used as the sole basis for treatment or other patient management decisions. A negative result may occur with  improper specimen collection/handling, submission of specimen other than nasopharyngeal swab, presence of viral mutation(s) within the areas targeted by this assay, and inadequate number of viral copies(<138 copies/mL). A negative result must be combined with clinical observations, patient history, and epidemiological information. The expected result is Negative.  Fact Sheet for Patients:  EntrepreneurPulse.com.au  Fact Sheet for Healthcare Providers:  IncredibleEmployment.be  This test is no t yet approved or cleared by the [place] and  has been authorized for detection and/or diagnosis of SARS-CoV-2 by FDA under an Emergency Use Authorization (EUA). This EUA will remain  in effect (meaning this test can be used) for the duration of the COVID-19 declaration under Section 564(b)(1) of the Act, 21 U.S.C.section 360bbb-3(b)(1), unless the authorization is terminated  or revoked sooner.       Influenza A by PCR NEGATIVE NEGATIVE Final   Influenza B by PCR NEGATIVE NEGATIVE Final     Comment: (NOTE) The Xpert Xpress SARS-CoV-2/FLU/RSV plus assay is intended as an aid in the diagnosis of influenza from Nasopharyngeal swab specimens and should not be used as a sole basis for treatment. Nasal washings and aspirates are unacceptable for Xpert Xpress SARS-CoV-2/FLU/RSV testing.  Fact Sheet for Patients: EntrepreneurPulse.com.au  Fact Sheet for Healthcare Providers: IncredibleEmployment.be  This test is not yet approved or cleared by the [place] FDA and has been authorized for detection and/or diagnosis of SARS-CoV-2 by FDA under an Emergency Use Authorization (EUA). This EUA will remain in effect (meaning this test can be used) for the duration of the COVID-19 declaration under Section 564(b)(1) of the Act, 21 U.S.C. section 360bbb-3(b)(1), unless the authorization is terminated or revoked.  Performed at [facility], [place] [address], [place], [place] [address]   Culture, blood (single)     Status: None   Collection Time: 01/18/21  4:40 AM   Specimen: BLOOD LEFT HAND  Result Value Ref Range Status   Specimen Description BLOOD LEFT HAND  Final   Special Requests   Final    BOTTLES DRAWN AEROBIC AND ANAEROBIC Blood Culture adequate volume   Culture   Final    NO GROWTH 5 DAYS Performed at [facility], [place] [address] [place], [place] [address]    Report Status 01/23/2021 FINAL  Final  MRSA Next Gen by PCR, Nasal     Status: None   Collection Time: 01/18/21 10:25 AM   Specimen: Nasal Mucosa; Nasal Swab  Result Value Ref Range Status   MRSA by PCR Next Gen NOT DETECTED NOT DETECTED Final    Comment: (NOTE) The GeneXpert MRSA Assay (FDA approved for NASAL specimens only), is one component of a comprehensive MRSA colonization surveillance program. It is not intended to diagnose MRSA infection nor to guide or monitor treatment for MRSA infections. Test performance is not FDA approved in patients less than 61  years old. Performed at [facility], [place] [address] [place], [place] [address]   Aerobic/Anaerobic Culture w Gram Stain (surgical/deep wound)     Status: None   Collection Time: 01/29/21  2:45 PM   Specimen: Abscess  Result Value Ref Range Status   Specimen Description ABSCESS UMBILICUS  Final   Special Requests NONE  Final   Gram Stain   Final    MODERATE WBC PRESENT, PREDOMINANTLY PMN NO ORGANISMS SEEN    Culture   Final    RARE ESCHERICHIA COLI NO ANAEROBES ISOLATED Performed at [facility], [place] [address] [place], [place] [address]    Report Status 02/03/2021 FINAL  Final   Organism ID, Bacteria ESCHERICHIA COLI  Final      Susceptibility   Escherichia coli - MIC*    AMPICILLIN <=2 SENSITIVE Sensitive     CEFAZOLIN <=4 SENSITIVE Sensitive     CEFEPIME <=0.12 SENSITIVE Sensitive     CEFTAZIDIME <=1  SENSITIVE Sensitive     CEFTRIAXONE <=0.25 SENSITIVE Sensitive     CIPROFLOXACIN <=0.25 SENSITIVE Sensitive     GENTAMICIN <=1 SENSITIVE Sensitive     IMIPENEM <=0.25 SENSITIVE Sensitive     TRIMETH/SULFA <=20 SENSITIVE Sensitive     AMPICILLIN/SULBACTAM <=2 SENSITIVE Sensitive     PIP/TAZO <=4 SENSITIVE Sensitive     * RARE ESCHERICHIA COLI    Pertinent Imaging All pertinent labs/Imagings/notes reviewed. All pertinent plain films and CT images have been personally visualized and interpreted; radiology reports have been reviewed. Decision making incorporated into the Impression / Recommendations.  I have spent a total of 40 minutes of face-to-face and non-face-to-face time, excluding clinical staff time, preparing to see patient, ordering tests and/or medications, and provide counseling the patient    Electronically signed by:  Rosiland Oz, MD Infectious Disease Physician [facility] [address] [place], [place] [address] Phone: [phone]  Fax: [phone]

## 2021-02-22 DIAGNOSIS — Z5181 Encounter for therapeutic drug level monitoring: Secondary | ICD-10-CM | POA: Insufficient documentation

## 2021-02-24 ENCOUNTER — Telehealth: Payer: Self-pay

## 2021-02-24 ENCOUNTER — Other Ambulatory Visit: Payer: Self-pay | Admitting: Student

## 2021-02-24 NOTE — Telephone Encounter (Signed)
Called patient, she states she would just come pick up her copies. I will route to primary nurse to place up front- patient aware to come pick it up.   Thank you!

## 2021-02-24 NOTE — Telephone Encounter (Signed)
Letter has been sent to patient informing them that their sleep study has expired. Patient will need to call and schedule an office visit to re-evaluate the need for a sleep study.    

## 2021-02-24 NOTE — Telephone Encounter (Signed)
Copy placed at front desk for pick up

## 2021-02-24 NOTE — Telephone Encounter (Signed)
Patient called and she has not gotten her copy of paperwork in the mail yet. She is not sure what to do

## 2021-02-25 ENCOUNTER — Inpatient Hospital Stay: Payer: Medicare Other | Admitting: Infectious Diseases

## 2021-03-24 NOTE — Progress Notes (Signed)
Cardiology Office Note:    Date:  03/25/2021   ID:  Thu, Baggett Nov 28, 1945, MRN [id number]  PCP:  Moshe Cipro, MD  Cardiologist:  Donato Heinz, MD  Electrophysiologist:  Thompson Grayer, MD   Referring MD: Moshe Cipro, MD   Chief Complaint  Patient presents with   Atrial Fibrillation    History of Present Illness:    Savannah Hobbs is a 76 y.o. female with a hx of paroxysmal atrial fibrillation, cirrhosis, hyperlipidemia who presents for follow-up.  She was diagnosed with COVID-19 in October 2020.  Had an episode of atrial fibrillation while recovering from this.  In January 2021 she had a second episode of atrial fibrillation that occurred after she received her first dose of the Covid vaccine.  She initially followed with cardiologist in [place] and was started on metoprolol.  She was not started on anticoagulation.  She presented to [facility] on 01/22/2020 with A. fib with RVR.  Echocardiogram showed normal biventricular function, mild MR, mild AS.  She was started on diltiazem drip for rate control and converted to normal sinus rhythm.  She was switched to p.o. diltiazem.  Eliquis 5 mg twice daily was started given CHA2DS2-VASc 4.  She reported having chest pain and a Lexiscan Myoview was done on 02/02/2020, which showed normal perfusion, EF 68%.  She saw Dr. Rayann Heman, underwent ablation on 09/26/2020 for A. fib/flutter.  She was admitted from 10/28 through 02/02/2021 with abdominal wall cellulitis and a ventral wall umbilical hernia and periumbilical abscess.  She completed a course of antibiotics, was felt to be high risk for surgery due to underlying cirrhosis.  She was discharged on 100 mg of spironolactone/40 mg Lasix for her cirrhosis.  Since last clinic appointment, she reports that she has been doing okay.  Had recent episode of tachycardia up to 120s, but states was sinus tach on Kardia mobile.  She has been having intermittent palpitations where she  feels like her heart skips beats.  Did report had some chest pain when she was having sinus tachycardia, otherwise denies any recent chest pain.  Reports she had a fall in September where she tripped while sleeping.  She is taking Eliquis.  She was admitted to [facility] last week with weakness.  Reports had carotid duplex that showed 50% stenosis.  Brain MRI was unremarkable.  States that she was told she had a TIA and recommended neurology follow-up.      Wt Readings from Last 3 Encounters:  03/25/21 183 lb (83 kg)  02/21/21 178 lb (80.7 kg)  02/02/21 189 lb 1.6 oz (85.8 kg)     Past Medical History:  Diagnosis Date   Allergy    Arthritis    Asthma    Cirrhosis of liver ([place])    due to "medications for arthritis".    Elevated cholesterol    Erosive esophagitis 1995   Fatty liver    GERD (gastroesophageal reflux disease)    Hiatal hernia    LARGE   Hx of colonic polyps 05/27/2018   Hx of transfusion of platelets    Paroxysmal atrial fibrillation (HCC)    Thrombocytopenia (HCC)    Typical atrial flutter (HCC)    UTI (lower urinary tract infection)     Past Surgical History:  Procedure Laterality Date   ABDOMINAL HYSTERECTOMY     partial   APPENDECTOMY     ATRIAL FIBRILLATION ABLATION N/A 09/26/2020   Procedure: ATRIAL FIBRILLATION ABLATION;  Surgeon: Thompson Grayer, MD;  Location: [place] [facility];  Service: Cardiovascular;  Laterality: N/A;   COLONOSCOPY     ESOPHAGOGASTRODUODENOSCOPY     GYNECOLOGIC CRYOSURGERY     x2   IR US GUIDE BX ASP/DRAIN  01/29/2021   REPLACEMENT TOTAL KNEE Left 2016   TONSILLECTOMY     TOTAL KNEE ARTHROPLASTY Right    TUBAL LIGATION     UPPER GASTROINTESTINAL ENDOSCOPY      Current Medications: Current Meds  Medication Sig   acetaminophen (TYLENOL) 500 MG tablet Take 500 mg by mouth every 6 (six) hours as needed for moderate pain.   apixaban (ELIQUIS) 5 MG TABS tablet Take 1 tablet (5 mg total) by mouth 2 (two) times daily.   Ascorbic  Acid (VITAMIN C) 1000 MG tablet Take 1,000 mg by mouth daily.   atorvastatin (LIPITOR) 40 MG tablet Take 40 mg by mouth daily.   cetirizine (ZYRTEC) 10 MG tablet Take 10 mg by mouth in the morning.   Cholecalciferol (VITAMIN D-3) 125 MCG (5000 UT) TABS Take 5,000 Units by mouth daily.   diltiazem (CARDIZEM CD) 120 MG 24 hr capsule Take 1 capsule (120 mg total) by mouth daily.   docusate sodium (COLACE) 100 MG capsule Take 100 mg by mouth 2 (two) times daily.   esomeprazole (NEXIUM) 20 MG capsule Take 20 mg by mouth daily before breakfast.   ferrous sulfate 325 (65 FE) MG tablet Take 325 mg by mouth daily.   flecainide (TAMBOCOR) 100 MG tablet Take 2 tablets (200 mg total) by mouth as directed. Take 2 tablets as needed daily for Afib (Patient taking differently: Take 200 mg by mouth daily as needed (a-fib).)   furosemide (LASIX) 40 MG tablet TAKE 1 TABLET BY MOUTH EVERY DAY   ibuprofen (ADVIL) 200 MG tablet Take 200 mg by mouth every 6 (six) hours as needed for mild pain (or headaches).   vitamin B-12 (CYANOCOBALAMIN) 1000 MCG tablet Take 1,000 mcg by mouth daily.     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   3 sons, 02-Dec-2015 1 son died in his sleep unclear cause   Updated 12/02/2015   Social Determinants of Health   Financial Resource Strain: Not on file  Food  Insecurity: Not on file  Transportation Needs: Not on file  Physical Activity: Not on file  Stress: Not on file  Social Connections: Not on file     Family History: The patient's family history includes Esophageal cancer in her father; Hypertension in her mother. There is no history of Colon polyps, Colon cancer, Rectal cancer, or Stomach cancer.  ROS:   Please see the history of present illness.     All other systems reviewed and are negative.  EKGs/Labs/Other Studies Reviewed:    The following  studies were reviewed today:   EKG:  EKG is not ordered today.  The ekg ordered at prior clinic visit demonstrates normal sinus rhythm, rate 83, no ST/T abnormalities  Recent Labs: 07/23/2020: BNP 75.2; Magnesium 1.7 01/25/2021: ALT 16 02/02/2021: BUN 8; Creatinine, Ser 0.38; Hemoglobin 10.6; Platelets 165; Potassium 3.7; Sodium 134  Recent Lipid Panel    Component Value Date/Time   CHOL 125 01/23/2020 0222   TRIG 142 01/23/2020 0222   HDL 44 01/23/2020 0222   CHOLHDL 2.8 01/23/2020 0222   VLDL 28 01/23/2020 0222   LDLCALC 53 01/23/2020 0222    Physical Exam:    VS:  BP 134/60    Pulse 93    Ht 5\' 5"  (1.651 m)    Wt 183 lb (83 kg)    SpO2 99%    BMI 30.45 kg/m     Wt Readings from Last 3 Encounters:  03/25/21 183 lb (83 kg)  02/21/21 178 lb (80.7 kg)  02/02/21 189 lb 1.6 oz (85.8 kg)     GEN: in no acute distress HEENT: Normal NECK: No JVD CARDIAC: RRR, 2 out of 6 systolic murmur RESPIRATORY:  Clear to auscultation without rales, wheezing or rhonchi  ABDOMEN: Soft, non-tender, non-distended MUSCULOSKELETAL: 2+ bilateral lower extremity edema SKIN: Warm and dry NEUROLOGIC:  Alert and oriented x 3 PSYCHIATRIC:  Normal affect   ASSESSMENT:    1. Paroxysmal atrial fibrillation (HCC)   2. TIA (transient ischemic attack)   3. Hyperlipidemia, unspecified hyperlipidemia type   4. Chest pain of uncertain etiology   5. Mild aortic stenosis   6. Essential hypertension   7.  Chronic diastolic heart failure (HCC)     PLAN:    Paroxysmal atrial fibrillation: CHA2DS2-VASc score 3 (hypertension, age, female).  Lexiscan Myoview was done on 02/02/2020, which showed normal perfusion, EF 68%.  Underwent ablation on 09/26/2020 for A. fib/flutter with Dr Rayann Heman. -Continue Eliquis 5 mg twice daily -Continue diltiazem 120 mg daily -Follows with EP  Chest pain: Atypical in description. Lexiscan Myoview was done on 02/02/2020, which showed normal perfusion, EF 68%.  No further work-up recommended  Aortic stenosis: Mild on echocardiogram on 01/23/2020.  Will monitor.  Hypertension: Appears controlled, continue diltiazem  Cirrhosis/Chronic diastolic heart failure: On 100 mg spironolactone/40 mg Lasix.  Appears euvolemic.  Will check CMP  Hyperlipidemia: On atorvastatin 40 mg daily.  Calcium score 206 (78th percentile on 09/19/2020).  LDL 53 on 01/23/2020.  We will repeat lipid panel and check LFTs given on statin with known cirrhosis  Carotid stenosis: Reported was found to have carotid stenosis on duplex at [facility] last week.  Will obtain records  Possible TIA: Admitted to [facility] last week with possible TIA.  Neurology follow-up was recommended, will refer   RTC in 6 months   Medication Adjustments/Labs and Tests Ordered: Current medicines are reviewed at length with the patient today.  Concerns regarding medicines are outlined above.  Orders Placed This Encounter  Procedures   Comprehensive Metabolic Panel (CMET)   Lipid panel   Ambulatory referral to Neurology   Meds ordered this encounter  Medications   diltiazem (CARDIZEM CD) 120 MG 24 hr capsule    Sig: Take 1 capsule (120 mg total) by mouth daily.    Dispense:  90 capsule    Refill:  3    Patient Instructions  Medications:  START DILTIAZEM CD 120 MG ONCE DAILY  Lab Work:  Your physician recommends that you HAVE LAB WORK TODAY  If you  have labs (blood work) drawn today and your tests are  completely normal, you will receive your results only by: [place] (if you have MyChart) OR A paper copy in the mail If you have any lab test that is abnormal or we need to change your treatment, we will call you to review the results.   Follow-Up: At [facility], you and your health needs are our priority.  As part of our continuing mission to provide you with exceptional heart care, we have created designated Provider Care Teams.  These Care Teams include your primary Cardiologist (physician) and Advanced Practice Providers (APPs -  Physician Assistants and Nurse Practitioners) who all work together to provide you with the care you need, when you need it.  We recommend signing up for the patient portal called "MyChart".  Sign up information is provided on this After Visit Summary.  MyChart is used to connect with patients for Virtual Visits (Telemedicine).  Patients are able to view lab/test results, encounter notes, upcoming appointments, etc.  Non-urgent messages can be sent to your provider as well.   To learn more about what you can do with MyChart, go to [email or web address].    Your next appointment:   6 month(s)  The format for your next appointment:   In Person  Provider:   Donato Heinz, MD        Signed, Donato Heinz, MD  03/25/2021 11:57 PM    [place]

## 2021-03-25 ENCOUNTER — Encounter: Payer: Self-pay | Admitting: Cardiology

## 2021-03-25 ENCOUNTER — Other Ambulatory Visit: Payer: Self-pay

## 2021-03-25 ENCOUNTER — Ambulatory Visit (INDEPENDENT_AMBULATORY_CARE_PROVIDER_SITE_OTHER): Payer: Medicare Other | Admitting: Cardiology

## 2021-03-25 VITALS — BP 134/60 | HR 93 | Ht 65.0 in | Wt 183.0 lb

## 2021-03-25 DIAGNOSIS — I480 Paroxysmal atrial fibrillation: Secondary | ICD-10-CM

## 2021-03-25 DIAGNOSIS — I10 Essential (primary) hypertension: Secondary | ICD-10-CM

## 2021-03-25 DIAGNOSIS — R079 Chest pain, unspecified: Secondary | ICD-10-CM | POA: Diagnosis not present

## 2021-03-25 DIAGNOSIS — I350 Nonrheumatic aortic (valve) stenosis: Secondary | ICD-10-CM

## 2021-03-25 DIAGNOSIS — G459 Transient cerebral ischemic attack, unspecified: Secondary | ICD-10-CM | POA: Diagnosis not present

## 2021-03-25 DIAGNOSIS — E785 Hyperlipidemia, unspecified: Secondary | ICD-10-CM

## 2021-03-25 DIAGNOSIS — I5032 Chronic diastolic (congestive) heart failure: Secondary | ICD-10-CM

## 2021-03-25 MED ORDER — DILTIAZEM HCL ER COATED BEADS 120 MG PO CP24: 120.0000 mg | ORAL_CAPSULE | Freq: Every day | ORAL | 3 refills | Status: DC

## 2021-03-25 NOTE — Patient Instructions (Signed)
Medications:  START DILTIAZEM CD 120 MG ONCE DAILY  Lab Work:  Your physician recommends that you HAVE LAB WORK TODAY  If you have labs (blood work) drawn today and your tests are completely normal, you will receive your results only by: MyChart Message (if you have MyChart) OR A paper copy in the mail If you have any lab test that is abnormal or we need to change your treatment, we will call you to review the results.   Follow-Up: At [facility] you and your health needs are our priority.  As part of our continuing mission to provide you with exceptional heart care, we have created designated Provider Care Teams.  These Care Teams include your primary Cardiologist (physician) and Advanced Practice Providers (APPs -  Physician Assistants and Nurse Practitioners) who all work together to provide you with the care you need, when you need it.  We recommend signing up for the patient portal called "MyChart".  Sign up information is provided on this After Visit Summary.  MyChart is used to connect with patients for Virtual Visits (Telemedicine).  Patients are able to view lab/test results, encounter notes, upcoming appointments, etc.  Non-urgent messages can be sent to your provider as well.   To learn more about what you can do with MyChart, go to [email or web address].    Your next appointment:   6 month(s)  The format for your next appointment:   In Person  Provider:   Donato Heinz, MD

## 2021-03-26 LAB — COMPREHENSIVE METABOLIC PANEL
ALT: 20 IU/L (ref 0–32)
AST: 37 IU/L (ref 0–40)
Albumin/Globulin Ratio: 1 — ABNORMAL LOW (ref 1.2–2.2)
Albumin: 3.8 g/dL (ref 3.7–4.7)
Alkaline Phosphatase: 127 IU/L — ABNORMAL HIGH (ref 44–121)
BUN/Creatinine Ratio: 29 — ABNORMAL HIGH (ref 12–28)
BUN: 14 mg/dL (ref 8–27)
Bilirubin Total: 1.3 mg/dL — ABNORMAL HIGH (ref 0.0–1.2)
CO2: 24 mmol/L (ref 20–29)
Calcium: 9.2 mg/dL (ref 8.7–10.3)
Chloride: 101 mmol/L (ref 96–106)
Creatinine, Ser: 0.49 mg/dL — ABNORMAL LOW (ref 0.57–1.00)
Globulin, Total: 3.7 g/dL (ref 1.5–4.5)
Glucose: 66 mg/dL — ABNORMAL LOW (ref 70–99)
Potassium: 4.3 mmol/L (ref 3.5–5.2)
Sodium: 141 mmol/L (ref 134–144)
Total Protein: 7.5 g/dL (ref 6.0–8.5)
eGFR: 99 mL/min/{1.73_m2} (ref >59)

## 2021-03-26 LAB — LIPID PANEL
Chol/HDL Ratio: 2.6 ratio (ref 0.0–4.4)
Cholesterol, Total: 160 mg/dL (ref 100–199)
HDL: 61 mg/dL (ref >39)
LDL Chol Calc (NIH): 82 mg/dL (ref 0–99)
Triglycerides: 94 mg/dL (ref 0–149)
VLDL Cholesterol Cal: 17 mg/dL (ref 5–40)

## 2021-03-27 ENCOUNTER — Other Ambulatory Visit: Payer: Self-pay

## 2021-03-27 DIAGNOSIS — R17 Unspecified jaundice: Secondary | ICD-10-CM

## 2021-03-27 DIAGNOSIS — K7460 Unspecified cirrhosis of liver: Secondary | ICD-10-CM

## 2021-03-27 DIAGNOSIS — Z79899 Other long term (current) drug therapy: Secondary | ICD-10-CM

## 2021-03-27 MED ORDER — ROSUVASTATIN CALCIUM 10 MG PO TABS: 10.0000 mg | ORAL_TABLET | Freq: Every day | ORAL | 3 refills | Status: DC

## 2021-03-27 NOTE — Telephone Encounter (Signed)
Canceling order for sleep study. Letter was mailed 30 days ago. Pt has followed up with Dr. Gardiner Rhyme in office. Sleep study not discussed. Pt can call back if to complete sleep study if wanted.

## 2021-03-31 NOTE — Addendum Note (Signed)
Addended by: Rexanne Mano B on: 03/31/2021 02:13 PM   Modules accepted: Orders

## 2021-04-01 ENCOUNTER — Other Ambulatory Visit (HOSPITAL_COMMUNITY): Payer: Self-pay

## 2021-04-07 ENCOUNTER — Encounter: Payer: Self-pay | Admitting: *Deleted

## 2021-04-08 ENCOUNTER — Ambulatory Visit (INDEPENDENT_AMBULATORY_CARE_PROVIDER_SITE_OTHER): Payer: Medicare Other | Admitting: Diagnostic Neuroimaging

## 2021-04-08 ENCOUNTER — Encounter: Payer: Self-pay | Admitting: Diagnostic Neuroimaging

## 2021-04-08 ENCOUNTER — Telehealth: Payer: Self-pay | Admitting: Diagnostic Neuroimaging

## 2021-04-08 VITALS — BP 132/64 | HR 96 | Ht 65.0 in | Wt 178.0 lb

## 2021-04-08 DIAGNOSIS — G20C Parkinsonism, unspecified: Secondary | ICD-10-CM

## 2021-04-08 DIAGNOSIS — G20 Parkinson's disease: Secondary | ICD-10-CM | POA: Diagnosis not present

## 2021-04-08 NOTE — Patient Instructions (Signed)
°  SUBTLE TREMORS / BRADYKINESIA / POSTURAL INSTABILITY (? Mild parkinson's dz) - check DATscan - may consider carb/levo in future  BILATERAL TREMOR / WEAKNESS EVENT ON 03/27/21 (unlikely TIA; more likely related to parkinsonism) - continue eliquis, rosuvastatin for stroke prevention

## 2021-04-08 NOTE — Telephone Encounter (Signed)
Medicare/aetna supp no auth req-sent to nuclear medicine to be scheduled. They will reach out to the patient to schedule.

## 2021-04-08 NOTE — Progress Notes (Signed)
[facility]  PATIENT: Savannah Hobbs DOB: 1945-04-09  REFERRING CLINICIAN: Donato Heinz* HISTORY FROM: patient  REASON FOR VISIT: new consult    HISTORICAL  CHIEF COMPLAINT:  Chief Complaint  Patient presents with   New Patient (Initial Visit)    Rm 6 with husband Cecilie Lowers- Pt reports she is here to discuss TIA like syptoms on 03/27/21 when to ED in [place]. Pt reports she had similar symptoms yesterday but did not go to ED. Pt reports sx were hand shaking and weakness in her legs.    HISTORY OF PRESENT ILLNESS:   76 year old female here for evaluation of abnormal spell.  03/27/2021 patient woke up with bilateral hand tremors, left-sided weakness greater than right-sided weakness.  Symptoms lasted 5 to 6 hours.  She went to the hospital for evaluation.  She was admitted for TIA work-up.  MRI showed no acute findings.  Left ICA stenosis of 50% was noted.  Other testing was unremarkable.  She was diagnosed with possible TIA and discharged home.  On further questioning patient has had gradual onset and progressive bilateral upper extremity tremors, short shuffling steps, hoarse soft voice, slow movements for last 6 to 12 months.  No change in smell or taste.  Some mild drooling at times.  No significant anxiety.  No acting out her dreams.   REVIEW OF SYSTEMS: Full 14 system review of systems performed and negative with exception of: as per HPI.  ALLERGIES: Allergies  Allergen Reactions   Shellfish Allergy Hives    HOME MEDICATIONS: Outpatient Medications Prior to Visit  Medication Sig Dispense Refill   acetaminophen (TYLENOL) 500 MG tablet Take 500 mg by mouth every 6 (six) hours as needed for moderate pain.     Ascorbic Acid (VITAMIN C) 1000 MG tablet Take 1,000 mg by mouth daily.     cetirizine (ZYRTEC) 10 MG tablet Take 10 mg by mouth in the morning.     Cholecalciferol (VITAMIN D-3) 125 MCG (5000 UT) TABS Take 5,000 Units by mouth daily.      diltiazem (CARDIZEM CD) 120 MG 24 hr capsule Take 1 capsule (120 mg total) by mouth daily. 90 capsule 3   docusate sodium (COLACE) 100 MG capsule Take 100 mg by mouth 2 (two) times daily.     esomeprazole (NEXIUM) 20 MG capsule Take 20 mg by mouth daily before breakfast.     ferrous sulfate 325 (65 FE) MG tablet Take 325 mg by mouth daily.     flecainide (TAMBOCOR) 100 MG tablet Take 2 tablets (200 mg total) by mouth as directed. Take 2 tablets as needed daily for Afib (Patient taking differently: Take 200 mg by mouth daily as needed (a-fib).) 10 tablet 1   furosemide (LASIX) 40 MG tablet TAKE 1 TABLET BY MOUTH EVERY DAY 90 tablet 3   ibuprofen (ADVIL) 200 MG tablet Take 200 mg by mouth every 6 (six) hours as needed for mild pain (or headaches).     rosuvastatin (CRESTOR) 10 MG tablet Take 1 tablet (10 mg total) by mouth daily. 90 tablet 3   vitamin B-12 (CYANOCOBALAMIN) 1000 MCG tablet Take 1,000 mcg by mouth daily.     apixaban (ELIQUIS) 5 MG TABS tablet Take 1 tablet (5 mg total) by mouth 2 (two) times daily. 180 tablet 1   spironolactone (ALDACTONE) 100 MG tablet Take 1 tablet (100 mg total) by mouth daily. 30 tablet 0   No facility-administered medications prior to visit.    PAST MEDICAL  HISTORY: Past Medical History:  Diagnosis Date   Allergy    Arthritis    Asthma    Atrial fibrillation ([place])    Cirrhosis of liver ([place])    due to "medications for arthritis".    Elevated cholesterol    Erosive esophagitis 1995   Fatty liver    GERD (gastroesophageal reflux disease)    Hiatal hernia    LARGE   Hx of colonic polyps 05/27/2018   Hx of transfusion of platelets    Hyperlipidemia    Hypertension    Paroxysmal atrial fibrillation (HCC)    Thrombocytopenia (HCC)    Typical atrial flutter (HCC)    UTI (lower urinary tract infection)     PAST SURGICAL HISTORY: Past Surgical History:  Procedure Laterality Date   ABDOMINAL HYSTERECTOMY     partial   APPENDECTOMY     ATRIAL  FIBRILLATION ABLATION N/A 09/26/2020   Procedure: ATRIAL FIBRILLATION ABLATION;  Surgeon: Thompson Grayer, MD;  Location: [place] [facility];  Service: Cardiovascular;  Laterality: N/A;   COLONOSCOPY     ESOPHAGOGASTRODUODENOSCOPY     GYNECOLOGIC CRYOSURGERY     x2   IR US GUIDE BX ASP/DRAIN  01/29/2021   REPLACEMENT TOTAL KNEE Left 2016   TONSILLECTOMY     TOTAL KNEE ARTHROPLASTY Right    TUBAL LIGATION     UPPER GASTROINTESTINAL ENDOSCOPY      FAMILY HISTORY: Family History  Problem Relation Age of Onset   Hypertension Mother    Esophageal cancer Father    Colon polyps Neg Hx    Colon cancer Neg Hx    Rectal cancer Neg Hx    Stomach cancer Neg Hx     SOCIAL HISTORY: Social History   Socioeconomic History   Marital status: Married    Spouse name: Not on file   Number of children: 3   Years of education: Nursing degree   Highest education level: Not on file  Occupational History   Occupation: retired Therapist, sports  Tobacco Use   Smoking status: Never   Smokeless tobacco: Never  Substance and Sexual Activity   Alcohol use: Yes    Alcohol/week: 1.0 standard drink    Types: 1 Glasses of wine per week   Drug use: No   Sexual activity: Not on file  Other Topics Concern   Not on file  Social History Narrative   Married, 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   3 sons, 04-Dec-2015 1 son died in his sleep unclear cause   Updated 12/02/2015   Right handed    Some caffeine    Social Determinants of Health   Financial Resource Strain: Not on file  Food Insecurity: Not on file  Transportation Needs: Not on file  Physical Activity: Not on file  Stress: Not on file  Social Connections: Not on file  Intimate Partner Violence: Not on file     PHYSICAL EXAM  GENERAL EXAM/CONSTITUTIONAL: Vitals:  Vitals:   04/08/21 0859  BP: 132/64  Pulse: 96  SpO2: 99%  Weight: 178 lb (80.7 kg)  Height: 5\' 5"  (1.651 m)   Body mass index is 29.62  kg/m. Wt Readings from Last 3 Encounters:  04/08/21 178 lb (80.7 kg)  03/25/21 183 lb (83 kg)  02/21/21 178 lb (80.7 kg)   Patient is in no distress; well developed, nourished and groomed; neck is supple  CARDIOVASCULAR: Examination of carotid arteries is normal; no carotid bruits Regular rate and rhythm, SYSTOLIC MURMUR  LEFT PRECORDIAL REGION Examination of peripheral vascular system by observation and palpation is normal  EYES: Ophthalmoscopic exam of optic discs and posterior segments is normal; no papilledema or hemorrhages No results found.  MUSCULOSKELETAL: Gait, strength, tone, movements noted in Neurologic exam below  NEUROLOGIC: MENTAL STATUS:  No flowsheet data found. awake, alert, oriented to person, place and time recent and remote memory intact normal attention and concentration language fluent, comprehension intact, naming intact fund of knowledge appropriate  CRANIAL NERVE:  2nd - no papilledema on fundoscopic exam 2nd, 3rd, 4th, 6th - pupils equal and reactive to light, visual fields full to confrontation, extraocular muscles intact, no nystagmus 5th - facial sensation symmetric 7th - facial strength symmetric 8th - hearing intact 9th - palate elevates symmetrically, uvula midline 11th - shoulder shrug symmetric 12th - tongue protrusion midline SOFT HOARSE VOICE MILD HYPOMIMIA  MOTOR:  MINIMAL POSTURAL TREMOR MILD BRADYKINESIA IN LUE AND LLE normal bulk and tone, full strength in the BUE, BLE  SENSORY:  normal and symmetric to light touch, temperature, vibration  COORDINATION:  finger-nose-finger, fine finger movements SLOWER ON LEFT  REFLEXES:  deep tendon reflexes TRACE and symmetric  GAIT/STATION:  narrow based gait; SHORT STEPS     DIAGNOSTIC DATA (LABS, IMAGING, TESTING) - I reviewed patient records, labs, notes, testing and imaging myself where available.  Lab Results  Component Value Date   WBC 23.2 (H) 02/02/2021   HGB 10.6  (L) 02/02/2021   HCT 33.0 (L) 02/02/2021   MCV 91.9 02/02/2021   PLT 165 02/02/2021      Component Value Date/Time   NA 141 03/25/2021 1614   K 4.3 03/25/2021 1614   CL 101 03/25/2021 1614   CO2 24 03/25/2021 1614   GLUCOSE 66 (L) 03/25/2021 1614   GLUCOSE 89 02/02/2021 0353   BUN 14 03/25/2021 1614   CREATININE 0.49 (L) 03/25/2021 1614   CALCIUM 9.2 03/25/2021 1614   PROT 7.5 03/25/2021 1614   ALBUMIN 3.8 03/25/2021 1614   AST 37 03/25/2021 1614   ALT 20 03/25/2021 1614   ALKPHOS 127 (H) 03/25/2021 1614   BILITOT 1.3 (H) 03/25/2021 1614   GFRNONAA >60 02/02/2021 0353   GFRAA 118 03/05/2020 1629   Lab Results  Component Value Date   CHOL 160 03/25/2021   HDL 61 03/25/2021   LDLCALC 82 03/25/2021   TRIG 94 03/25/2021   CHOLHDL 2.6 03/25/2021   No results found for: HGBA1C No results found for: VITAMINB12 Lab Results  Component Value Date   TSH 1.912 01/22/2020      ASSESSMENT AND PLAN  76 y.o. year old female here with mild tremors, bradykinesia, postural stability for past 6 to 12 months, may represent early mild Parkinson's disease.   Dx:  1. Parkinsonism, unspecified Parkinsonism type ([place])     PLAN:  SUBTLE TREMORS / BRADYKINESIA / POSTURAL INSTABILITY (? Mild parkinson's dz) - check DATscan - may consider carb/levo in future  BILATERAL TREMOR / WEAKNESS EVENT ON 03/27/21 (unlikely TIA; more likely related to parkinsonism) - continue eliquis, rosuvastatin for stroke prevention  Orders Placed This Encounter  Procedures   NM BRAIN DATSCAN TUMOR LOC INFLAM SPECT 1 DAY   Return in about 8 months (around 12/07/2021).    Penni Bombard, MD 6/96/2952, 84:13 AM Certified in Neurology, Neurophysiology and Neuroimaging  [facility] [address], [place], [place] [address] [phone]

## 2021-04-10 ENCOUNTER — Ambulatory Visit
Admission: RE | Admit: 2021-04-10 | Discharge: 2021-04-10 | Disposition: A | Discharge status: Home / Self Care | Payer: Medicare Other | Source: Ambulatory Visit | Attending: Cardiology | Admitting: Cardiology

## 2021-04-10 DIAGNOSIS — R17 Unspecified jaundice: Secondary | ICD-10-CM

## 2021-04-11 NOTE — Addendum Note (Signed)
Addended by: Rexanne Mano B on: 04/11/2021 11:08 AM   Modules accepted: Orders

## 2021-04-24 ENCOUNTER — Encounter (HOSPITAL_COMMUNITY)
Admission: RE | Admit: 2021-04-24 | Discharge: 2021-04-24 | Disposition: A | Discharge status: Home / Self Care | Payer: Medicare Other | Source: Ambulatory Visit | Attending: Diagnostic Neuroimaging | Admitting: Diagnostic Neuroimaging

## 2021-04-24 ENCOUNTER — Other Ambulatory Visit: Payer: Self-pay

## 2021-04-24 DIAGNOSIS — G20 Parkinson's disease: Secondary | ICD-10-CM | POA: Insufficient documentation

## 2021-04-24 MED ORDER — IOFLUPANE I 123 185 MBQ/2.5ML IV SOLN
4.3000 | Freq: Once | INTRAVENOUS | Status: AC | PRN
  Administered 2021-04-24: 4.3 via INTRAVENOUS
  Filled 2021-04-24: qty 5

## 2021-04-24 MED ORDER — POTASSIUM IODIDE (ANTIDOTE) 130 MG PO TABS
ORAL_TABLET | ORAL | Status: AC
  Filled 2021-04-24: qty 1

## 2021-04-24 MED ORDER — TECHNETIUM TC 99M MEBROFENIN IV KIT: 4.3500 | PACK | Freq: Once | INTRAVENOUS | Status: DC | PRN

## 2021-04-25 ENCOUNTER — Telehealth: Payer: Self-pay

## 2021-04-25 NOTE — Telephone Encounter (Signed)
Called and spoke with patient regarding patient assistance forms. Received notification that patient was not approved for Eliquis patient assistance due to not meeting 3% out of pocket expense yet. Patient states she currently has 2 more months and will start looking to make arrangements to pay for prescription going forward until out of pocket is met and she can resubmit application for assistance. Advised patient to please let us know if there are any issues with obtaining the medication going forward, patient verbalized understanding.   Patient also wanted to let Dr. Gardiner Rhyme know that she has been diagnosed with Parkisons and recently had a brain scan for this. Advised patient I would forward her message to Dr. Gardiner Rhyme to make him aware. Patient verbalized understanding.

## 2021-04-30 ENCOUNTER — Telehealth: Payer: Self-pay | Admitting: Cardiology

## 2021-04-30 NOTE — Telephone Encounter (Signed)
° °  Patient Name: Savannah Hobbs  DOB: 1945-12-10 MRN: [id number]  Primary Cardiologist: Donato Heinz, MD  Chart reviewed as part of pre-operative protocol coverage.   Simple dental extractions (I.e. 1-2 teeth under local anesthesia) are considered low risk procedures per guidelines and generally do not require any specific cardiac clearance. It is also generally accepted that for simple extractions and dental cleanings, there is no need to interrupt blood thinner therapy. Therefore we would not stop Eliquis for this procedure.  SBE prophylaxis is not required for the patient from a cardiac standpoint.  I will route this recommendation to the requesting party via Epic fax function and remove from pre-op pool.  Please call with questions.  Charlie Pitter, PA-C 04/30/2021, 12:33 PM

## 2021-04-30 NOTE — Telephone Encounter (Signed)
° °  Pre-operative Risk Assessment    Patient Name: Savannah Hobbs  DOB: 04-18-1945 MRN: [id number]     Request for Surgical Clearance    Procedure:  Dental Extraction - Amount of Teeth to be Pulled:  one tooth  Date of Surgery:  Clearance 05/01/21                                 Surgeon:  Dr. Tobey Grim Surgeon's Group or Practice Name:  [facility] Phone number:  [phone] Fax number:  [phone]   Type of Clearance Requested:   - Pharmacy:  Hold Apixaban (Eliquis)    Type of Anesthesia: Local      Additional requests/questions:    [name]   04/30/2021, 12:08 PM

## 2021-07-09 ENCOUNTER — Telehealth: Payer: Self-pay | Admitting: Cardiology

## 2021-07-09 NOTE — Telephone Encounter (Signed)
Routed to pharmacy team for eliquis refill  ?

## 2021-07-09 NOTE — Telephone Encounter (Signed)
Pt states that she need a new prescription for Eliquis '5mg'$ . Pt states that she no longer uses [facility] and will need prescription send to  ?[id number] [place], [address] [place] ? ?Pt would also like a callback. Please advise ?

## 2021-07-10 ENCOUNTER — Other Ambulatory Visit: Payer: Self-pay

## 2021-07-10 MED ORDER — APIXABAN 5 MG PO TABS: 5.0000 mg | ORAL_TABLET | Freq: Two times a day (BID) | ORAL | 1 refills | Status: DC

## 2021-07-10 NOTE — Telephone Encounter (Signed)
Prescription refill request for Eliquis received. ?Indication:Afib ?Last office visit:1/23 ?Scr:0.4 ?Age: 76 ?Weight:80.7 kg ? ?Prescription refilled ? ?

## 2021-07-17 ENCOUNTER — Telehealth: Payer: Self-pay | Admitting: Cardiology

## 2021-07-17 NOTE — Telephone Encounter (Signed)
?*  STAT* If patient is at the pharmacy, call can be transferred to refill team. ? ? ?1. Which medications need to be refilled? (please list name of each medication and dose if known) apixaban (ELIQUIS) 5 MG TABS tablet ? ?2. Which pharmacy/location (including street and city if local pharmacy) is medication to be sent to? [facility] ?[id number] ?[address] ?[place], [place] [address] ?[phone] ?Fax: [phone] ? ?3. Do they need a 30 day or 90 day supply? 90 ? ?

## 2021-07-18 MED ORDER — APIXABAN 5 MG PO TABS: 5.0000 mg | ORAL_TABLET | Freq: Two times a day (BID) | ORAL | 1 refills | Status: DC

## 2021-07-18 NOTE — Telephone Encounter (Signed)
Explained to patient that we cannot mail prescriptions out of the country.  Will print and mail to patient, she voiced understanding ?

## 2021-07-28 ENCOUNTER — Other Ambulatory Visit: Payer: Self-pay

## 2021-08-11 ENCOUNTER — Encounter: Payer: Self-pay | Admitting: Internal Medicine

## 2021-10-07 ENCOUNTER — Ambulatory Visit (INDEPENDENT_AMBULATORY_CARE_PROVIDER_SITE_OTHER): Payer: Medicare Other | Admitting: Internal Medicine

## 2021-10-07 ENCOUNTER — Encounter: Payer: Self-pay | Admitting: Internal Medicine

## 2021-10-07 VITALS — BP 122/60 | HR 90 | Ht 65.0 in | Wt 185.0 lb

## 2021-10-07 DIAGNOSIS — Z7901 Long term (current) use of anticoagulants: Secondary | ICD-10-CM | POA: Diagnosis not present

## 2021-10-07 DIAGNOSIS — K7460 Unspecified cirrhosis of liver: Secondary | ICD-10-CM

## 2021-10-07 DIAGNOSIS — Z860100 Personal history of colon polyps, unspecified: Secondary | ICD-10-CM

## 2021-10-07 DIAGNOSIS — K7290 Hepatic failure, unspecified without coma: Secondary | ICD-10-CM

## 2021-10-07 DIAGNOSIS — K7581 Nonalcoholic steatohepatitis (NASH): Secondary | ICD-10-CM | POA: Diagnosis not present

## 2021-10-07 DIAGNOSIS — I480 Paroxysmal atrial fibrillation: Secondary | ICD-10-CM

## 2021-10-07 DIAGNOSIS — K429 Umbilical hernia without obstruction or gangrene: Secondary | ICD-10-CM

## 2021-10-07 DIAGNOSIS — I350 Nonrheumatic aortic (valve) stenosis: Secondary | ICD-10-CM

## 2021-10-07 NOTE — Progress Notes (Addendum)
Savannah Hobbs 76 y.o. 1945-09-10 [id number]  Assessment & Plan:   Encounter Diagnoses  Name Primary?   Decompensated hepatic cirrhosis (HCC) Yes   Liver cirrhosis secondary to NASH [facility])    Long term current use of anticoagulant-Eliquis-A-fib    Hx of colonic polyps    Periumbilical hernia    Paroxysmal atrial fibrillation (HCC)    Aortic valve stenosis, etiology of cardiac valve disease unspecified    We have requested recent laboratory testing from primary care.  I want to review those labs.   Prior to deep sedation with propofol and EGD colonoscopy I would like her to have follow-up with cardiology.  Question if she needs an updated echocardiogram.  Defer to cardiology but given her comorbidities I think it makes sense to have them see her before we proceed with EGD and colonoscopy.  We will need to hold Eliquis 2 days prior to these procedures in anticipation of possible polypectomy or other therapy that could lead to bleeding.  Would ask cardiology input on that as well.  I have explained the rare but real increased risk of stroke when off Eliquis.  Though ibuprofen use was incredibly rare given that she takes Eliquis chronically I asked her to avoid that.  Acetaminophen is safe to use intermittently.   I appreciate the opportunity to care for this patient. CC: Savannah Cipro, MD Savannah Hobbs  Lab review:  09/30/2021 CBC with white blood cell count 17, hemoglobin 14.2 platelets 174 (normal)  Vitamin D level 39  Ferritin 124  Creatinine 0.5 BUN 13 bilirubin 1.4 AST 44 ALT 37 albumin 3.4 glucose 116 alk phos 114   A1c 6.1 Subjective:   Chief Complaint: Cirrhosis  HPI 76 year old woman with Savannah Hobbs cirrhosis-decompensated, last seen by me for colonoscopy in 2020 (at least 3 small SSP) and was seen by my colleagues in the hospital in November 2022 when she had cellulitis around a periumbilical hernia as well as ascites.  She was treated with  antibiotics and recovered from that.  Other medical problems include long-term anticoagulation for A-fib status post RFA, myelodysplasia-myeloproliferative disease, and GERD.  She has chronic thrombocytopenia as well.  She saw Savannah Hobbs of cardiology in January bilirubin was elevated history of cirrhosis noted so was referred back for office follow-up.  She reports that she has been feeling okay.  Some mild dyspnea on exertion which she says is stable no chest pain.  Denies problems with edema or swelling.  She lost a lot of weight when she was hospitalized and was treated with diuretics.  Looking at her medication list she very rarely takes ibuprofen for a bad headache.  May have alcoholic beverages rarely for special occasions perhaps a half a glass of wine.  Has never been a big drinker.  Has a history of at least 3 sessile serrated polyps removed in March 2020 and recall colonoscopy was recommended for this year.  Not having lower GI symptoms.  She is ready to proceed with previously recommended EGD to screen for varices.  She and her husband deny confusion and sleepiness or other signs of hepatic encephalopathy.  Cardiology had recommended follow-up 6 months from last visit which has not been done.  She recalls that they did not have their schedule available at the time.  She does have a history of aortic stenosis reported as mild the last echocardiogram was in 2021.  She recently returned from a 7-week trip out [place] on vacation with a son and his  family.  She does have some balance issues and there is a question of Parkinson's disease she saw neurology earlier this year and has a follow-up scheduled for September.  Recent A1c 6.1% she says.  She believes she continues to have thrombocytopenia but she thinks her LFTs were normal at recent primary care visit.  She forgot to bring those labs.  Other review of systems is occasional trouble swallowing liquids and occasional heartburn.   Allergies  Allergen  Reactions   Shellfish Allergy Hives   Current Meds  Medication Sig   acetaminophen (TYLENOL) 500 MG tablet Take 500 mg by mouth every 6 (six) hours as needed for moderate pain.   apixaban (ELIQUIS) 5 MG TABS tablet Take 1 tablet (5 mg total) by mouth 2 (two) times daily.   Ascorbic Acid (VITAMIN C) 1000 MG tablet Take 1,000 mg by mouth daily.   atorvastatin (LIPITOR) 40 MG tablet Take 40 mg by mouth daily.   cetirizine (ZYRTEC) 10 MG tablet Take 10 mg by mouth in the morning.   Cholecalciferol (VITAMIN D-3) 125 MCG (5000 UT) TABS Take 5,000 Units by mouth daily.   docusate sodium (COLACE) 100 MG capsule Take 100 mg by mouth 2 (two) times daily.   esomeprazole (NEXIUM) 20 MG capsule Take 20 mg by mouth daily before breakfast.   ferrous sulfate 325 (65 FE) MG tablet Take 325 mg by mouth daily.   flecainide (TAMBOCOR) 100 MG tablet Take 2 tablets (200 mg total) by mouth as directed. Take 2 tablets as needed daily for Afib (Patient taking differently: Take 200 mg by mouth daily as needed (a-fib).)   furosemide (LASIX) 40 MG tablet TAKE 1 TABLET BY MOUTH EVERY DAY   ibuprofen (ADVIL) 200 MG tablet Take 200 mg by mouth every 6 (six) hours as needed for mild pain (or headaches).   vitamin B-12 (CYANOCOBALAMIN) 1000 MCG tablet Take 1,000 mcg by mouth daily.   Past Medical History:  Diagnosis Date   Abdominal wall cellulitis 01/18/2021   Allergy    Arthritis    Asthma    Atrial fibrillation ([place])    Cirrhosis of liver (HCC)    NASH   Elevated cholesterol    Erosive esophagitis 1995   Fatty liver    GERD (gastroesophageal reflux disease)    Hiatal hernia    LARGE   Hx of colonic polyps 05/27/2018   Hx of transfusion of platelets    Hyperlipidemia    Hypertension    Myelodysplasia-Myeloproliferative disease (HCC)    Paroxysmal atrial fibrillation (HCC)    Thrombocytopenia (HCC)    Typical atrial flutter (HCC)    UTI (lower urinary tract infection)    Past Surgical History:   Procedure Laterality Date   ABDOMINAL HYSTERECTOMY     partial   APPENDECTOMY     ATRIAL FIBRILLATION ABLATION N/A 09/26/2020   Procedure: ATRIAL FIBRILLATION ABLATION;  Surgeon: Thompson Grayer, MD;  Location: [facility];  Service: Cardiovascular;  Laterality: N/A;   COLONOSCOPY     ESOPHAGOGASTRODUODENOSCOPY     GYNECOLOGIC CRYOSURGERY     x2   IR US GUIDE BX ASP/DRAIN  01/29/2021   REPLACEMENT TOTAL KNEE Left 2016   TONSILLECTOMY     TOTAL KNEE ARTHROPLASTY Right    TUBAL LIGATION     UPPER GASTROINTESTINAL ENDOSCOPY     Social History   Social History Narrative   Married, 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   3 sons, August 2017 1 son  died in his sleep unclear cause   Right handed    Some caffeine    family history includes Esophageal cancer in her father; Hypertension in her mother.   Review of Systems See HPI  Objective:   Physical Exam '@BP'  122/60   Pulse 90   Ht '5\' 5"'  (1.651 m)   Wt 185 lb (83.9 kg)   BMI 30.79 kg/m @  General:  Well-developed, well-nourished and in no acute distress, overweight/obese Eyes:  anicteric. Neck:   supple w/o thyromegaly or mass.  Lungs: Clear to auscultation bilaterally. Heart:   S1S2, RRR, 2/6 muscical SEM Abdomen:  Obese and ? Some ascites, soft, non-tender, no hepatosplenomegaly felt, BS+. She has a small reducible umbilical hernia, nontender, sl firm Lymph:  no cervical or supraclavicular adenopathy. Extremities:   Tr bilateral edema, cyanosis or clubbing Skin   no rash. Neuro:  A&O x 3.  Psych:  appropriate mood and  Affect.   Data Reviewed: See HPI

## 2021-10-07 NOTE — Patient Instructions (Addendum)
Please set up an appointment with Dr Gardiner Rhyme for a recheck on your heart issues.   We will request your lab results from your PCP.   Stop Ibuprofen per Dr Carlean Purl.  I appreciate the opportunity to care for you. Silvano Rusk, MD, [facility]

## 2021-10-15 NOTE — Progress Notes (Signed)
Cardiology Clinic Note   Patient Name: Savannah Hobbs Date of Encounter: 10/17/2021  Primary Care Provider:  Moshe Cipro, MD Primary Cardiologist:  Donato Heinz, MD  Patient Profile    Savannah Hobbs 76 year old female presents to the clinic today for follow-up evaluation of her paroxysmal atrial fibrillation and preoperative cardiac evaluation  Past Medical History    Past Medical History:  Diagnosis Date   Abdominal wall cellulitis 01/18/2021   Allergy    Arthritis    Asthma    Atrial fibrillation ([place])    Cirrhosis of liver (HCC)    NASH   Elevated cholesterol    Erosive esophagitis 1995   Fatty liver    GERD (gastroesophageal reflux disease)    Hiatal hernia    LARGE   Hx of colonic polyps 05/27/2018   Hx of transfusion of platelets    Hyperlipidemia    Hypertension    Myelodysplasia-Myeloproliferative disease ([place])    Paroxysmal atrial fibrillation ([place])    Thrombocytopenia (HCC)    Typical atrial flutter ([place])    UTI (lower urinary tract infection)    Past Surgical History:  Procedure Laterality Date   ABDOMINAL HYSTERECTOMY     partial   APPENDECTOMY     ATRIAL FIBRILLATION ABLATION N/A 09/26/2020   Procedure: ATRIAL FIBRILLATION ABLATION;  Surgeon: Thompson Grayer, MD;  Location: [facility];  Service: Cardiovascular;  Laterality: N/A;   COLONOSCOPY     ESOPHAGOGASTRODUODENOSCOPY     GYNECOLOGIC CRYOSURGERY     x2   IR US GUIDE BX ASP/DRAIN  01/29/2021   REPLACEMENT TOTAL KNEE Left 2016   TONSILLECTOMY     TOTAL KNEE ARTHROPLASTY Right    TUBAL LIGATION     UPPER GASTROINTESTINAL ENDOSCOPY      Allergies  Allergies  Allergen Reactions   Shellfish Allergy Hives    History of Present Illness    Savannah Hobbs has PMH of atrial flutter with RVR, paroxysmal atrial fibrillation, nonalcoholic steatotic hepatitis, thrombocytopenia, obesity, secondary hypercoagulability state, periumbilical pain, and colon polyps.   She was diagnosed with a COVID infection 10/20.  As she was recovering from this she had an episode of atrial fibrillation.  Again in January 2021 she had a second episode of atrial fibrillation that occurred after her first dose of COVID-vaccine.  She was initially followed by a cardiologist in [place].  She was started on metoprolol at that time.  She was not started on anticoagulation.  She presented to [facility] 01/22/2020 with atrial fibrillation and rapid ventricular response.  Her echocardiogram showed normal biventricular function, mild MR and mild AAS.  She was initiated on diltiazem gtt. and converted to sinus rhythm.  She was transitioned to p.o. diltiazem.  Her CHA2DS2-VASc score was 4.  She was started on apixaban.  She reported chest discomfort and a nuclear stress test 02/02/2020 showed normal perfusion, 68% EF.  She followed up with Dr. Rayann Heman and underwent ablation on 09/26/2020 for atrial flutter.  She was admitted from 10/28 - 02/02/2021 with abdominal wall cellulitis and ventral wall umbilical hernia as well as periumbilical abscess.  She completed a course of antibiotics.  She was felt to be high risk for surgery due to her underlying cirrhosis.  She was discharged with spironolactone and furosemide.  She was seen in follow-up on 03/25/2021.  She reported that she had been doing okay.  She did note episodes of heart rate greater than 120.  This showed as  sinus tachycardia on her mobile EKG device.  She reported intermittent periods of palpitations that felt like her heart was skipping beats.  She reported that she had a fall in September which seem to be mechanical in nature.  She reported compliance with apixaban.  She had been admitted to [facility] hospital the previous week for weakness.  She had a carotid duplex that showed 50% stenosis.  Her brain MRI was unremarkable.  She reported that she was told she had a TIA and follow-up with neurology was recommended.  She presents  to the clinic today for follow-up evaluation and preoperative cardiac evaluation.  She states she feels well.  She recently was on vacation out [place] with her family and did an increased amount of walking.  She did note increased swelling with her increased physical activity.  Since she has returned home her swelling has gone down.  She denies any episodes of chest discomfort.  She is tolerating her medications well.  I recommended elevating her lower extremities when not active and low-sodium diet.  She reports that her husband does majority of the cooking and has cut back on salt.  Her EKG today shows normal sinus rhythm 95 bpm.  We will plan follow-up in 12 months.  Today she denies chest pain, shortness of breath, lower extremity edema, fatigue, palpitations, melena, hematuria, hemoptysis, diaphoresis, weakness, presyncope, syncope, orthopnea, and PND.    Home Medications    Prior to Admission medications   Medication Sig Start Date End Date Taking? Authorizing Provider  acetaminophen (TYLENOL) 500 MG tablet Take 500 mg by mouth every 6 (six) hours as needed for moderate pain.    [provider]  apixaban (ELIQUIS) 5 MG TABS tablet Take 1 tablet (5 mg total) by mouth 2 (two) times daily. 07/18/21 01/14/22  Donato Heinz, MD  Ascorbic Acid (VITAMIN C) 1000 MG tablet Take 1,000 mg by mouth daily.    [provider]  atorvastatin (LIPITOR) 40 MG tablet Take 40 mg by mouth daily.    [provider]  cetirizine (ZYRTEC) 10 MG tablet Take 10 mg by mouth in the morning.    [provider]  Cholecalciferol (VITAMIN D-3) 125 MCG (5000 UT) TABS Take 5,000 Units by mouth daily.    [provider]  diltiazem (CARDIZEM CD) 120 MG 24 hr capsule Take 1 capsule (120 mg total) by mouth daily. 03/25/21 06/23/21  Donato Heinz, MD  docusate sodium (COLACE) 100 MG capsule Take 100 mg by mouth 2 (two) times daily.    [provider]  esomeprazole  (NEXIUM) 20 MG capsule Take 20 mg by mouth daily before breakfast.    [provider]  ferrous sulfate 325 (65 FE) MG tablet Take 325 mg by mouth daily. 07/01/20   [provider]  flecainide (TAMBOCOR) 100 MG tablet Take 2 tablets (200 mg total) by mouth as directed. Take 2 tablets as needed daily for Afib Patient taking differently: Take 200 mg by mouth daily as needed (a-fib). 02/01/20   Allred, Jeneen Rinks, MD  furosemide (LASIX) 40 MG tablet TAKE 1 TABLET BY MOUTH EVERY DAY 02/05/21   Donato Heinz, MD  spironolactone (ALDACTONE) 100 MG tablet Take 1 tablet (100 mg total) by mouth daily. 02/03/21 03/05/21  Gaylan Gerold, DO  vitamin B-12 (CYANOCOBALAMIN) 1000 MCG tablet Take 1,000 mcg by mouth daily.    [provider]    Family History    Family History  Problem Relation Age of Onset  Hypertension Mother    Esophageal cancer Father    Colon polyps Neg Hx    Colon cancer Neg Hx    Rectal cancer Neg Hx    Stomach cancer Neg Hx    She indicated that her mother is deceased. She indicated that her father is deceased. She indicated that the status of her neg hx is unknown.  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   3 sons, 11-22-15 1 son died in his sleep unclear cause   Right handed    Some caffeine    Social Determinants of Health    Financial Resource Strain: Not on file  Food Insecurity: Not on file  Transportation Needs: Not on file  Physical Activity: Not on file  Stress: Not on file  Social Connections: Not on file  Intimate Partner Violence: Not on file     Review of Systems    General:  No chills, fever, night sweats or weight changes.  Cardiovascular:  No chest pain, dyspnea on exertion, edema, orthopnea, palpitations, paroxysmal nocturnal dyspnea. Dermatological: No rash, lesions/masses Respiratory: No cough, dyspnea Urologic: No hematuria, dysuria Abdominal:   No nausea, vomiting, diarrhea, bright red blood per rectum, melena, or hematemesis Neurologic:  No visual changes, wkns, changes in mental status. All other systems reviewed and are otherwise negative except as noted above.  Physical Exam    VS:  BP 118/64   Pulse 85   Ht '5\' 5"'$  (1.651 m)   Wt 183 lb 9.6 oz (83.3 kg)   SpO2 96%   BMI 30.55 kg/m  , BMI Body mass index is 30.55 kg/m. GEN: Well nourished, well developed, in no acute distress. HEENT: normal. Neck: Supple, no JVD, carotid bruits, or masses. Cardiac: RRR, 1-6/0 systolic murmur heard along right sternal border , rubs, or gallops. No clubbing, cyanosis, edema.  Radials/DP/PT 2+ and equal bilaterally.  Respiratory:  Respirations regular and unlabored, clear to auscultation bilaterally. GI: Soft, nontender, nondistended, BS + x 4. MS: no deformity or atrophy. Skin: warm and dry, no rash. Neuro:  Strength and sensation are intact. Psych: Normal affect.  Accessory Clinical Findings    Recent Labs: 02/02/2021: Hemoglobin 10.6; Platelets 165 03/25/2021: ALT 20; BUN 14; Creatinine, Ser 0.49; Potassium 4.3; Sodium 141   Recent Lipid Panel    Component Value Date/Time   CHOL 160 03/25/2021 1614   TRIG 94 03/25/2021 1614   HDL 61 03/25/2021 1614   CHOLHDL 2.6 03/25/2021 1614   CHOLHDL 2.8 01/23/2020 0222   VLDL 28 01/23/2020 0222   LDLCALC 82 03/25/2021 1614    ECG  personally reviewed by me today-normal sinus rhythm no ST or T wave deviation 85 bpm  Echocardiogram 01/23/2020  IMPRESSIONS     1. Left ventricular ejection fraction,  by estimation, is 60 to 65%. The  left ventricle has normal function. The left ventricle has no regional  wall motion abnormalities. Left ventricular diastolic function could not  be evaluated.   2. Right ventricular systolic function is normal. The right ventricular  size is normal.   3. Left atrial size was moderately dilated.   4. The trivial pericardial effusion is posterior to the left ventricle.   5. The mitral valve is abnormal. Mild mitral valve regurgitation.   6. The aortic valve is tricuspid. There is moderate calcification of the  aortic valve. Aortic valve regurgitation is not visualized. Mild aortic  valve stenosis. Aortic valve area, by VTI measures 1.59 cm. Aortic valve  mean gradient measures 11.0 mmHg.  Aortic valve Vmax measures 2.15 m/s.   7. The inferior vena cava is normal in size with <50% respiratory  variability, suggesting right atrial pressure of 8 mmHg.   Comparison(s): No prior Echocardiogram.  Assessment & Plan   1.  Paroxysmal atrial fibrillation-no recent episodes of accelerated or irregular heartbeat.  Reports compliance with apixaban and denies bleeding issues. Continue apixaban, diltiazem Heart healthy low-sodium diet-salty 6 given Increase physical activity as tolerated Avoid triggers caffeine, chocolate, EtOH,  Essential hypertension-BP today 118/64.  Well-controlled at home. Continue spironolactone, diltiazem, Heart healthy low-sodium diet-salty 6 given Increase physical activity as tolerated  Chronic diastolic CHF-euvolemic today.  Weight stable.  NYHA class I-2. Continue current medical therapy Increase physical activity as tolerated Continue daily weights  Preoperative cardiac evaluation-EGD, colonoscopy     Primary Cardiologist: Donato Heinz,  MD  Chart reviewed as part of pre-operative protocol coverage. Given past medical history and time since last visit, based on ACC/AHA guidelines, Neita Landrigan would be at acceptable risk for the planned procedure without further cardiovascular testing.   Patient was advised that if she develops new symptoms prior to surgery to contact our office to arrange a follow-up appointment.  He verbalized understanding.  Patient with diagnosis of A fib on Eliquis for anticoagulation.     Procedure: EGD, colonoscopy Date of procedure: TBD     CHA2DS2-VASc Score = 7  This indicates a 11.2% annual risk of stroke. The patient's score is based upon: CHF History: 1 HTN History: 1 Diabetes History: 0 Stroke History: 2 Vascular Disease History: 1 Age Score: 1 Gender Score: 1   Per Dr Newman Nickels note on 03/25/21, patient has hx of TIA, CHF, and HTN. Cardiac CT on 09/19/20 showed CAD.   CrCl 130 mL/min Platelet count 174K   Per office protocol, patient can hold Eliquis for 1 days prior to procedure  Disposition: Follow-up with Dr. Gardiner Rhyme in 9-12 months.   Jossie Ng. Lyman Balingit NP-C     10/17/2021, 2:50 PM [facility] [address] Office [phone] Fax [phone]  Notice: This dictation was prepared with Dragon dictation along with smaller phrase technology. Any transcriptional errors that result from this process are unintentional and may not be corrected upon review.  I spent 13 minutes examining this patient, reviewing medications, and using patient centered shared decision making involving her cardiac care.  Prior to her visit I spent greater than 20 minutes reviewing her past medical history,  medications, and prior cardiac tests.

## 2021-10-16 ENCOUNTER — Telehealth: Payer: Self-pay

## 2021-10-16 NOTE — Telephone Encounter (Signed)
   Name: Savannah Hobbs  DOB: [birth year]  MRN: [id number]  Primary Cardiologist: Donato Heinz, MD  Chart reviewed as part of pre-operative protocol coverage. The patient has an upcoming visit scheduled with Coletta Memos, NP on 10/17/2021 at which time clearance can be addressed in case there are any issues that would impact surgical recommendations.  EGD/colonoscopy is not scheduled until date TBD as below. I added preop FYI to appointment note so that provider is aware to address at time of outpatient visit.  Per office protocol the cardiology provider should forward their finalized clearance decision and recommendations regarding antiplatelet therapy to the requesting party below.    This message has also been routed to pharmacy for input on holding Eliquis as requested below so that this information is available to the clearing provider at time of patient's appointment.   I will remove this message from the preop box as separate preop APP input not needed at this time.   Please call with any questions.  Lenna Sciara, NP  10/16/2021, 3:01 PM

## 2021-10-16 NOTE — Telephone Encounter (Signed)
Please evaluate for pre-op hold for Eliquis, pt is having EGD, colonoscopy. Thank you

## 2021-10-16 NOTE — Telephone Encounter (Signed)
Did not receive formal clearance.  Request came from LPN in pod.  Patient with diagnosis of A fib on Eliquis for anticoagulation.    Procedure: EGD, colonoscopy Date of procedure: TBD   CHA2DS2-VASc Score = 7  This indicates a 11.2% annual risk of stroke. The patient's score is based upon: CHF History: 1 HTN History: 1 Diabetes History: 0 Stroke History: 2 Vascular Disease History: 1 Age Score: 1 Gender Score: 1   Per Dr Newman Nickels note on 03/25/21, patient has hx of TIA, CHF, and HTN. Cardiac CT on 09/19/20 showed CAD.  CrCl 130 mL/min Platelet count 174K  Per office protocol, patient can hold Eliquis for 1 days prior to procedure.    **This guidance is not considered finalized until pre-operative APP has relayed final recommendations.**

## 2021-10-17 ENCOUNTER — Encounter: Payer: Self-pay | Admitting: General Practice

## 2021-10-17 ENCOUNTER — Ambulatory Visit (INDEPENDENT_AMBULATORY_CARE_PROVIDER_SITE_OTHER): Payer: Medicare Other | Admitting: General Practice

## 2021-10-17 VITALS — BP 118/64 | HR 85 | Ht 65.0 in | Wt 183.6 lb

## 2021-10-17 DIAGNOSIS — Z01810 Encounter for preprocedural cardiovascular examination: Secondary | ICD-10-CM

## 2021-10-17 DIAGNOSIS — I5032 Chronic diastolic (congestive) heart failure: Secondary | ICD-10-CM

## 2021-10-17 DIAGNOSIS — I10 Essential (primary) hypertension: Secondary | ICD-10-CM

## 2021-10-17 DIAGNOSIS — I480 Paroxysmal atrial fibrillation: Secondary | ICD-10-CM | POA: Diagnosis not present

## 2021-10-17 NOTE — Patient Instructions (Signed)
Medication Instructions:  The current medical regimen is effective;  continue present plan and medications as directed. Please refer to the Current Medication list given to you today.   *If you need a refill on your cardiac medications before your next appointment, please call your pharmacy*  Lab Work:   Testing/Procedures:  NONE    NONE If you have labs (blood work) drawn today and your tests are completely normal, you will receive your results only by: [place] (if you have MyChart) OR  A paper copy in the mail If you have any lab test that is abnormal or we need to change your treatment, we will call you to review the results.  You may also go to any of these [facility] locations:  [id number],  [place] [address] ([facility])  [address]  [place] [address] [employer])  Special Instructions PLEASE MAINTAIN PHYSICAL ACTIVITY  PLEASE ELEVATE LOWER EXTREMITIES WHEN INACTIVE  CLEARED FOR EGD/COLONOSCOPY  MAY HOLD ELIQUIS 1 DAY PRIOR  Follow-Up: Your next appointment:  12 month(s) In Person with Donato Heinz, MD    Please call our office 2 months in advance to schedule this appointment   At [facility], you and your health needs are our priority.  As part of our continuing mission to provide you with exceptional heart care, we have created designated Provider Care Teams.  These Care Teams include your primary Cardiologist (physician) and Advanced Practice Providers (APPs -  Physician Assistants and Nurse Practitioners) who all work together to provide you with the care you need, when you need it.  We recommend signing up for the patient portal called "MyChart".  Sign up information  is provided on this After Visit Summary.  MyChart is used to connect with patients for Virtual Visits (Telemedicine).  Patients are able to view lab/test results, encounter notes, upcoming appointments, etc.  Non-urgent messages can be sent to your provider as well.   To learn more about what you can do with MyChart, go to [email or web address].    Important Information About Sugar     Heart-Healthy Eating Plan Heart-healthy meal planning includes: Eating less unhealthy fats. Eating more healthy fats. Making other changes in your diet. Talk with your doctor or a diet specialist (dietitian) to create an eating plan that is right for you. What is my plan?  Cooking Avoid frying your food. Try to bake, boil, grill, or broil it instead. You can also reduce fat by: Removing the skin from poultry. Removing all visible fats from meats. Steaming vegetables in water or broth. Meal planning  At meals, divide your plate into four equal parts: Fill one-half of your plate with vegetables and green salads. Fill one-fourth of your plate with whole grains. Fill one-fourth of your plate with lean protein foods. Eat 4-5 servings of vegetables per day. A serving of vegetables is: 1 cup of raw or cooked vegetables. 2 cups of raw leafy greens. Eat 4-5 servings of fruit per day. A serving of fruit is: 1 medium whole fruit.  cup of dried fruit.  cup of fresh, frozen, or canned fruit.  cup of 100% fruit juice. Eat more  foods that have soluble fiber. These are apples, broccoli, carrots, beans, peas, and barley. Try to get 20-30 g of fiber per day. Eat 4-5 servings of nuts, legumes, and seeds per week: 1 serving of dried beans or legumes equals  cup after being cooked. 1 serving of nuts is  cup. 1 serving of seeds equals 1 tablespoon. General information Eat more home-cooked food. Eat less restaurant, buffet, and fast food. Limit or avoid alcohol. Limit foods that are high in starch and  sugar. Avoid fried foods. Lose weight if you are overweight. Keep track of how much salt (sodium) you eat. This is important if you have high blood pressure. Ask your doctor to tell you more about this. Try to add vegetarian meals each week. Fats Choose healthy fats. These include olive oil and canola oil, flaxseeds, walnuts, almonds, and seeds. Eat more omega-3 fats. These include salmon, mackerel, sardines, tuna, flaxseed oil, and ground flaxseeds. Try to eat fish at least 2 times each week. Check food labels. Avoid foods with trans fats or high amounts of saturated fat. Limit saturated fats. These are often found in animal products, such as meats, butter, and cream. These are also found in plant foods, such as palm oil, palm kernel oil, and coconut oil. Avoid foods with partially hydrogenated oils in them. These have trans fats. Examples are stick margarine, some tub margarines, cookies, crackers, and other baked goods. What foods can I eat? Fruits All fresh, canned (in natural juice), or frozen fruits. Vegetables Fresh or frozen vegetables (raw, steamed, roasted, or grilled). Green salads. Grains Most grains. Choose whole wheat and whole grains most of the time. Rice and pasta, including brown rice and pastas made with whole wheat. Meats and other proteins Lean, well-trimmed beef, veal, pork, and lamb. Chicken and [place] without skin. All fish and shellfish. Wild duck, rabbit, pheasant, and venison. Egg whites or low-cholesterol egg substitutes. Dried beans, peas, lentils, and tofu. Seeds and most nuts. Dairy Low-fat or nonfat cheeses, including ricotta and mozzarella. Skim or 1% milk that is liquid, powdered, or evaporated. Buttermilk that is made with low-fat milk. Nonfat or low-fat yogurt. Fats and oils Non-hydrogenated (trans-free) margarines. Vegetable oils, including soybean, sesame, sunflower, olive, peanut, safflower, corn, canola, and cottonseed. Salad dressings or mayonnaise  made with a vegetable oil. Beverages Mineral water. Coffee and tea. Diet carbonated beverages. Sweets and desserts Sherbet, gelatin, and fruit ice. Small amounts of dark chocolate. Limit all sweets and desserts. Seasonings and condiments All seasonings and condiments. The items listed above may not be a complete list of foods and drinks you can eat. Contact a dietitian for more options. What foods should I avoid? Fruits Canned fruit in heavy syrup. Fruit in cream or butter sauce. Fried fruit. Limit coconut. Vegetables Vegetables cooked in cheese, cream, or butter sauce. Fried vegetables. Grains Breads that are made with saturated or trans fats, oils, or whole milk. Croissants. Sweet rolls. Donuts. High-fat crackers, such as cheese crackers. Meats and other proteins Fatty meats, such as hot dogs, ribs, sausage, bacon, rib-eye roast or steak. High-fat deli meats, such as salami and bologna. Caviar. Domestic duck and goose. Organ meats, such as liver. Dairy Cream, sour cream, cream cheese, and creamed cottage cheese. Whole-milk cheeses. Whole or 2% milk that is liquid, evaporated, or condensed. Whole buttermilk. Cream sauce or high-fat cheese sauce. Yogurt that is made from whole milk. Fats and oils Meat fat, or shortening. Cocoa butter, hydrogenated oils, palm oil, coconut oil, palm kernel oil. Solid  fats and shortenings, including bacon fat, salt pork, lard, and butter. Nondairy cream substitutes. Salad dressings with cheese or sour cream. Beverages Regular sodas and juice drinks with added sugar. Sweets and desserts Frosting. Pudding. Cookies. Cakes. Pies. Milk chocolate or white chocolate. Buttered syrups. Full-fat ice cream or ice cream drinks. The items listed above may not be a complete list of foods and drinks to avoid. Contact a dietitian for more information. Summary Heart-healthy meal planning includes eating less unhealthy fats, eating more healthy fats, and making other changes  in your diet. Eat a balanced diet. This includes fruits and vegetables, low-fat or nonfat dairy, lean protein, nuts and legumes, whole grains, and heart-healthy oils and fats. This information is not intended to replace advice given to you by your health care provider. Make sure you discuss any questions you have with your health care provider. Document Revised: 07/18/2020 Document Reviewed: 07/18/2020 Elsevier Patient Education  2022 [employer].

## 2021-10-21 ENCOUNTER — Telehealth: Payer: Self-pay

## 2021-10-21 DIAGNOSIS — Z86010 Personal history of colonic polyps: Secondary | ICD-10-CM

## 2021-10-21 DIAGNOSIS — K7460 Unspecified cirrhosis of liver: Secondary | ICD-10-CM

## 2021-10-21 NOTE — Telephone Encounter (Signed)
I spoke with Savannah Hobbs and told her since cardiology has cleared her we can set up her ECL per Dr Carlean Purl. She chose 11/27/2021. I confirmed her address and I am mailing her the instructions.

## 2021-11-27 ENCOUNTER — Ambulatory Visit (AMBULATORY_SURGERY_CENTER): Payer: Medicare Other | Admitting: Internal Medicine

## 2021-11-27 ENCOUNTER — Encounter: Payer: Self-pay | Admitting: Internal Medicine

## 2021-11-27 VITALS — BP 133/64 | HR 91 | Temp 98.4°F | Resp 14 | Ht 65.0 in | Wt 185.0 lb

## 2021-11-27 DIAGNOSIS — K5140 Inflammatory polyps of colon without complications: Secondary | ICD-10-CM

## 2021-11-27 DIAGNOSIS — K635 Polyp of colon: Secondary | ICD-10-CM

## 2021-11-27 DIAGNOSIS — D125 Benign neoplasm of sigmoid colon: Secondary | ICD-10-CM

## 2021-11-27 DIAGNOSIS — Z09 Encounter for follow-up examination after completed treatment for conditions other than malignant neoplasm: Secondary | ICD-10-CM

## 2021-11-27 DIAGNOSIS — I8510 Secondary esophageal varices without bleeding: Secondary | ICD-10-CM | POA: Diagnosis not present

## 2021-11-27 DIAGNOSIS — K7460 Unspecified cirrhosis of liver: Secondary | ICD-10-CM

## 2021-11-27 DIAGNOSIS — D123 Benign neoplasm of transverse colon: Secondary | ICD-10-CM

## 2021-11-27 DIAGNOSIS — D124 Benign neoplasm of descending colon: Secondary | ICD-10-CM

## 2021-11-27 DIAGNOSIS — K621 Rectal polyp: Secondary | ICD-10-CM

## 2021-11-27 DIAGNOSIS — D128 Benign neoplasm of rectum: Secondary | ICD-10-CM

## 2021-11-27 DIAGNOSIS — Z86010 Personal history of colonic polyps: Secondary | ICD-10-CM

## 2021-11-27 DIAGNOSIS — K3189 Other diseases of stomach and duodenum: Secondary | ICD-10-CM

## 2021-11-27 DIAGNOSIS — K766 Portal hypertension: Secondary | ICD-10-CM

## 2021-11-27 DIAGNOSIS — D120 Benign neoplasm of cecum: Secondary | ICD-10-CM

## 2021-11-27 HISTORY — DX: Other diseases of stomach and duodenum: K31.89

## 2021-11-27 HISTORY — DX: Secondary esophageal varices without bleeding: I85.10

## 2021-11-27 MED ORDER — SODIUM CHLORIDE 0.9 % IV SOLN: 500.0000 mL | INTRAVENOUS | Status: DC

## 2021-11-27 NOTE — Progress Notes (Signed)
Pt in recovery with monitors in place, VSS. Report given to receiving RN. Bite guard was placed with pt awake to ensure comfort. No dental or soft tissue damage noted. 

## 2021-11-27 NOTE — Progress Notes (Signed)
[facility] History and Physical   Primary Care Physician:  Moshe Cipro, MD   Reason for Procedure:   Hx colon polyps, cirrhosis r/o varices  Plan:    EGD and colonoscopy     HPI: Savannah Hobbs is a 76 y.o. female w/ hx colon polyps and decompensated cirrhosis. Saw cardiology 10/17/21 and was cl;eared to do procedures and hold Eliquis.  HPI 10/07/21 76 year old woman with Savannah Hobbs cirrhosis-decompensated, last seen by me for colonoscopy in 2020 (at least 3 small SSP) and was seen by my colleagues in the hospital in November 2022 when she had cellulitis around a periumbilical hernia as well as ascites.  She was treated with antibiotics and recovered from that.  Other medical problems include long-term anticoagulation for A-fib status post RFA, myelodysplasia-myeloproliferative disease, and GERD.  She has chronic thrombocytopenia as well.  She saw Dr. Nechama Guard of cardiology in January bilirubin was elevated history of cirrhosis noted so was referred back for office follow-up.  She reports that she has been feeling okay.  Some mild dyspnea on exertion which she says is stable no chest pain.  Denies problems with edema or swelling.  She lost a lot of weight when she was hospitalized and was treated with diuretics.  Looking at her medication list she very rarely takes ibuprofen for a bad headache.  May have alcoholic beverages rarely for special occasions perhaps a half a glass of wine.  Has never been a big drinker.  Has a history of at least 3 sessile serrated polyps removed in March 2020 and recall colonoscopy was recommended for this year.  Not having lower GI symptoms.  She is ready to proceed with previously recommended EGD to screen for varices.  She and her husband deny confusion and sleepiness or other signs of hepatic encephalopathy.  Cardiology had recommended follow-up 6 months from last visit which has not been done.  She recalls that they did not have their schedule available  at the time.  She does have a history of aortic stenosis reported as mild the last echocardiogram was in 2021.   She recently returned from a 7-week trip out [place] on vacation with a son and his family.  She does have some balance issues and there is a question of Parkinson's disease she saw neurology earlier this year and has a follow-up scheduled for September.  Recent A1c 6.1% she says.  She believes she continues to have thrombocytopenia but she thinks her LFTs were normal at recent primary care visit.  She forgot to bring those labs.  Other review of systems is occasional trouble swallowing liquids and occasional heartburn.      Past Medical History:  Diagnosis Date   Abdominal wall cellulitis 01/18/2021   Allergy    Arthritis    Asthma    Atrial fibrillation ([place])    Cirrhosis of liver (HCC)    NASH   Elevated cholesterol    Erosive esophagitis 1995   Fatty liver    GERD (gastroesophageal reflux disease)    Hiatal hernia    LARGE   Hx of colonic polyps 05/27/2018   Hx of transfusion of platelets    Hyperlipidemia    Hypertension    Myelodysplasia-Myeloproliferative disease (HCC)    Paroxysmal atrial fibrillation (HCC)    Thrombocytopenia (HCC)    Typical atrial flutter (HCC)    UTI (lower urinary tract infection)     Past Surgical History:  Procedure Laterality Date   ABDOMINAL HYSTERECTOMY  partial   APPENDECTOMY     ATRIAL FIBRILLATION ABLATION N/A 09/26/2020   Procedure: ATRIAL FIBRILLATION ABLATION;  Surgeon: Thompson Grayer, MD;  Location: [facility];  Service: Cardiovascular;  Laterality: N/A;   COLONOSCOPY     ESOPHAGOGASTRODUODENOSCOPY     GYNECOLOGIC CRYOSURGERY     x2   IR US GUIDE BX ASP/DRAIN  01/29/2021   REPLACEMENT TOTAL KNEE Left 2016   TONSILLECTOMY     TOTAL KNEE ARTHROPLASTY Right    TUBAL LIGATION     UPPER GASTROINTESTINAL ENDOSCOPY      Prior to Admission medications   Medication Sig Start Date End Date Taking? Authorizing Provider   Ascorbic Acid (VITAMIN C) 1000 MG tablet Take 1,000 mg by mouth daily.   Yes [provider]  cetirizine (ZYRTEC) 10 MG tablet Take 10 mg by mouth in the morning.   Yes [provider]  diltiazem (CARDIZEM CD) 120 MG 24 hr capsule Take 1 capsule (120 mg total) by mouth daily. 03/25/21 11/27/21 Yes Donato Heinz, MD  docusate sodium (COLACE) 100 MG capsule Take 100 mg by mouth 2 (two) times daily.   Yes [provider]  esomeprazole (NEXIUM) 20 MG capsule Take 20 mg by mouth daily before breakfast.   Yes [provider]  furosemide (LASIX) 40 MG tablet TAKE 1 TABLET BY MOUTH EVERY DAY 02/05/21  Yes Donato Heinz, MD  simvastatin (ZOCOR) 20 MG tablet Take 20 mg by mouth daily.   Yes [provider]  spironolactone (ALDACTONE) 100 MG tablet Take 1 tablet (100 mg total) by mouth daily. 02/03/21 11/27/21 Yes Gaylan Gerold, DO  vitamin B-12 (CYANOCOBALAMIN) 1000 MCG tablet Take 1,000 mcg by mouth daily.   Yes [provider]  acetaminophen (TYLENOL) 500 MG tablet Take 500 mg by mouth every 6 (six) hours as needed for moderate pain. Patient not taking: Reported on 11/27/2021    [provider]  apixaban (ELIQUIS) 5 MG TABS tablet Take 1 tablet (5 mg total) by mouth 2 (two) times daily. 07/18/21 01/14/22  Donato Heinz, MD  flecainide (TAMBOCOR) 100 MG tablet Take 2 tablets (200 mg total) by mouth as directed. Take 2 tablets as needed daily for Afib Patient not taking: Reported on 10/17/2021 02/01/20   Thompson Grayer, MD    Current Outpatient Medications  Medication Sig Dispense Refill   Ascorbic Acid (VITAMIN C) 1000 MG tablet Take 1,000 mg by mouth daily.     cetirizine (ZYRTEC) 10 MG tablet Take 10 mg by mouth in the morning.     diltiazem (CARDIZEM CD) 120 MG 24 hr capsule Take 1 capsule (120 mg total) by mouth daily. 90 capsule 3   docusate sodium (COLACE) 100 MG capsule Take 100 mg by mouth 2 (two) times daily.      esomeprazole (NEXIUM) 20 MG capsule Take 20 mg by mouth daily before breakfast.     furosemide (LASIX) 40 MG tablet TAKE 1 TABLET BY MOUTH EVERY DAY 90 tablet 3   simvastatin (ZOCOR) 20 MG tablet Take 20 mg by mouth daily.     spironolactone (ALDACTONE) 100 MG tablet Take 1 tablet (100 mg total) by mouth daily. 30 tablet 0   vitamin B-12 (CYANOCOBALAMIN) 1000 MCG tablet Take 1,000 mcg by mouth daily.     acetaminophen (TYLENOL) 500 MG tablet Take 500 mg by mouth every 6 (six) hours as needed for moderate pain. (Patient not taking: Reported on 11/27/2021)     apixaban (ELIQUIS) 5 MG  TABS tablet Take 1 tablet (5 mg total) by mouth 2 (two) times daily. 180 tablet 1   flecainide (TAMBOCOR) 100 MG tablet Take 2 tablets (200 mg total) by mouth as directed. Take 2 tablets as needed daily for Afib (Patient not taking: Reported on 10/17/2021) 10 tablet 1   Current Facility-Administered Medications  Medication Dose Route Frequency Provider Last Rate Last Admin   0.9 %  sodium chloride infusion  500 mL Intravenous Continuous Gatha Mayer, MD        Allergies as of 11/27/2021 - Review Complete 11/27/2021  Allergen Reaction Noted   Shellfish allergy Hives 12/02/2015    Family History  Problem Relation Age of Onset   Hypertension Mother    Esophageal cancer Father    Colon polyps Neg Hx    Colon cancer Neg Hx    Rectal cancer Neg Hx    Stomach cancer Neg Hx     Social History   Socioeconomic History   Marital status: Married    Spouse name: Not on file   Number of children: 3   Years of education: Nursing degree   Highest education level: Not on file  Occupational History   Occupation: retired Therapist, sports  Tobacco Use   Smoking status: Never   Smokeless tobacco: Never  Substance and Sexual Activity   Alcohol use: Yes    Alcohol/week: 1.0 standard drink of alcohol    Types: 1 Glasses of wine per week   Drug use: No   Sexual activity: Not on file  Other Topics Concern   Not on file  Social  History Narrative   Married, 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   3 sons, November 06, 2015 1 son died in his sleep unclear cause   Right handed    Some caffeine    Social Determinants of Health   Financial Resource Strain: Not on file  Food Insecurity: Not on file  Transportation Needs: Not on file  Physical Activity: Not on file  Stress: Not on file  Social Connections: Not on file  Intimate Partner Violence: Not on file    Review of Systems:  All other review of systems negative except as mentioned in the HPI.  Physical Exam: Vital signs BP (!) 142/66   Pulse 88   Temp 98.4 F (36.9 C)   Ht '5\' 5"'$  (1.651 m)   Wt 185 lb (83.9 kg)   SpO2 97%   BMI 30.79 kg/m   General:   Alert,  Well-developed, well-nourished, pleasant and cooperative in NAD Lungs:  Clear throughout to auscultation.   Heart:  Regular rate and rhythm; no  clicks, rubs,  or gallops. +2/6 SEM Abdomen:  Soft, nontender and nondistended. Normal bowel sounds small soft umbilical hernia  Neuro/Psych:  Alert and cooperative. Normal mood and affect. A and O x 3   '@Savannah Hobbs'$  Savannah Maffucci, MD, [facility] [phone] (pager) 11/27/2021 1:31 PM@

## 2021-11-27 NOTE — Op Note (Signed)
[facility] Patient Name: Savannah Hobbs Procedure Date: 11/27/2021 1:33 PM MRN: [id number] Endoscopist: Gatha Mayer , MD Age: 76 Referring MD:  Date of Birth: July 03, 1945 Gender: Female Account #: [id number] Procedure:                Colonoscopy Indications:              High risk colon cancer surveillance: Personal                            history of sessile serrated colon polyp (less than                            10 mm in size) with no dysplasia, Last colonoscopy:                            March 2020 Medicines:                Monitored Anesthesia Care Procedure:                Pre-Anesthesia Assessment:                           - Prior to the procedure, a History and Physical                            was performed, and patient medications and                            allergies were reviewed. The patient's tolerance of                            previous anesthesia was also reviewed. The risks                            and benefits of the procedure and the sedation                            options and risks were discussed with the patient.                            All questions were answered, and informed consent                            was obtained. Prior Anticoagulants: The patient has                            taken no previous anticoagulant or antiplatelet                            agents. ASA Grade Assessment: III - A patient with                            severe systemic disease. After reviewing the risks  and benefits, the patient was deemed in                            satisfactory condition to undergo the procedure.                           After obtaining informed consent, the colonoscope                            was passed under direct vision. Throughout the                            procedure, the patient's blood pressure, pulse, and                            oxygen saturations were monitored continuously.  The                            Olympus PCF-H190DL (#[id number]) Colonoscope was                            introduced through the anus and advanced to the the                            cecum, identified by appendiceal orifice and                            ileocecal valve. The colonoscopy was performed                            without difficulty. The patient tolerated the                            procedure well. The quality of the bowel                            preparation was good. The bowel preparation used                            was Miralax via split dose instruction. The                            ileocecal valve, appendiceal orifice, and rectum                            were photographed. Scope In: 1:48:41 PM Scope Out: 2:14:02 PM Scope Withdrawal Time: 0 hours 21 minutes 12 seconds  Total Procedure Duration: 0 hours 25 minutes 21 seconds  Findings:                 The perianal and digital rectal examinations were                            normal.  Eleven (11) sessile polyps were found in the                            rectum, sigmoid colon, descending colon, transverse                            colon and cecum. The polyps were diminutive in                            size. These polyps were removed with a cold snare.                            Resection and retrieval were complete. Verification                            of patient identification for the specimen was                            done. Estimated blood loss was minimal.                           Multiple diverticula were found in the sigmoid                            colon.                           External and internal hemorrhoids were found.                           A diffuse area of moderately altered vascular,                            congested and erythematous mucosa was found in the                            entire colon.                           The exam was otherwise  without abnormality on                            direct and retroflexion views. Complications:            No immediate complications. Estimated Blood Loss:     Estimated blood loss was minimal. Impression:               - Eleven (11) diminutive polyps in the rectum, in                            the sigmoid colon, in the descending colon, in the                            transverse colon and in the cecum, removed with a  cold snare. Resected and retrieved.                           - Diverticulosis in the sigmoid colon.                           - External and internal hemorrhoids.                           - Altered vascular, congested and erythematous                            mucosa in the entire examined colon. 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                           - The examination was otherwise normal on direct                            and retroflexion views.                           - Personal history of colonic polyps. Recommendation:           - Patient has a contact number available for                            emergencies. The signs and symptoms of potential                            delayed complications were discussed with the                            patient. Return to normal activities tomorrow.                            Written discharge instructions were provided to the                            patient.                           - Resume previous diet.                           - Continue present medications.                           - Resume Eliquis (apixaban) at prior dose tomorrow.                           - Await pathology results.                           - No recommendation at this time regarding  repeat                            colonoscopy. Gatha Mayer, MD 11/27/2021 2:33:25 PM This report has been signed electronically.

## 2021-11-27 NOTE — Patient Instructions (Addendum)
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 I checked for a possible stomach infection also.  I found and removed eleven tiny colon polyps. All look benign. Also saw diverticulosis, hemorrhoids and suspect blood flow changes in colon lining related to cirrhosis (portal colopathy).  I will check polyp pathology and let you know my recommendations.  I appreciate the opportunity to care for you. Gatha Mayer, MD, [facility]  Information on polyps, diverticulosis and hemorrhoids given to you today.  Await pathology results.  Resume Eliquis at prior dose tomorrow.  Resume previous diet and medications.   YOU HAD AN ENDOSCOPIC PROCEDURE TODAY AT [facility]:   Refer to the procedure report that was given to you for any specific questions about what was found during the examination.  If the procedure report does not answer your questions, please call your gastroenterologist to clarify.  If you requested that your care partner not be given the details of your procedure findings, then the procedure report has been included in a sealed envelope for you to review at your convenience later.  YOU SHOULD EXPECT: Some feelings of bloating in the abdomen. Passage of more gas than usual.  Walking can help get rid of the air that was put into your GI tract during the procedure and reduce the bloating. If you had a lower endoscopy (such as a colonoscopy or flexible sigmoidoscopy) you may notice spotting of blood in your stool or on the toilet paper. If you underwent a bowel prep for your procedure, you may not have a normal bowel movement for a few days.  Please Note:  You might notice some irritation and congestion in your nose or some drainage.  This is from the oxygen used during your procedure.  There is no need for concern and it should clear up in a day or  so.  SYMPTOMS TO REPORT IMMEDIATELY:  Following lower endoscopy (colonoscopy or flexible sigmoidoscopy):  Excessive amounts of blood in the stool  Significant tenderness or worsening of abdominal pains  Swelling of the abdomen that is new, acute  Fever of 100F or higher  Following upper endoscopy (EGD)  Vomiting of blood or coffee ground material  New chest pain or pain under the shoulder blades  Painful or persistently difficult swallowing  New shortness of breath  Fever of 100F or higher  Black, tarry-looking stools  For urgent or emergent issues, a gastroenterologist can be reached at any hour by calling [phone]. Do not use MyChart messaging for urgent concerns.    DIET:  We do recommend a small meal at first, but then you may proceed to your regular diet.  Drink plenty of fluids but you should avoid alcoholic beverages for 24 hours.  ACTIVITY:  You should plan to take it easy for the rest of today and you should NOT DRIVE or use heavy machinery until tomorrow (because of the sedation medicines used during the test).    FOLLOW UP: Our staff will call the number listed on your records the next business day following your procedure.  We will call around 7:15- 8:00 am to check on you and address any questions or concerns that you may have regarding the information given to you following your procedure. If we do not reach you, we will leave a message.  If you develop any symptoms (ie: fever, flu-like symptoms, shortness of breath,  cough etc.) before then, please call [phone].  If you test positive for Covid 19 in the 2 weeks post procedure, please call and report this information to [place].    If any biopsies were taken you will be contacted by phone or by letter within the next 1-3 weeks.  Please call us at [phone] if you have not heard about the biopsies in 3 weeks.    SIGNATURES/CONFIDENTIALITY: You and/or your care partner have signed paperwork which will be  entered into your electronic medical record.  These signatures attest to the fact that that the information above on your After Visit Summary has been reviewed and is understood.  Full responsibility of the confidentiality of this discharge information lies with you and/or your care-partner.

## 2021-11-27 NOTE — Progress Notes (Signed)
Called to room to assist during endoscopic procedure.  Patient ID and intended procedure confirmed with present staff. Received instructions for my participation in the procedure from the performing physician.  

## 2021-11-27 NOTE — Op Note (Signed)
[facility] Patient Name: Savannah Hobbs Procedure Date: 11/27/2021 1:34 PM MRN: [id number] Endoscopist: Gatha Mayer , MD Age: 76 Referring MD:  Date of Birth: 1946-02-14 Gender: Female Account #: [id number] Procedure:                Upper GI endoscopy Indications:              Cirrhosis rule out esophageal varices Medicines:                Monitored Anesthesia Care Procedure:                Pre-Anesthesia Assessment:                           - Prior to the procedure, a History and Physical                            was performed, and patient medications and                            allergies were reviewed. The patient's tolerance of                            previous anesthesia was also reviewed. The risks                            and benefits of the procedure and the sedation                            options and risks were discussed with the patient.                            All questions were answered, and informed consent                            was obtained. Prior Anticoagulants: The patient                            last took Eliquis (apixaban) 2 days prior to the                            procedure. ASA Grade Assessment: III - A patient                            with severe systemic disease. After reviewing the                            risks and benefits, the patient was deemed in                            satisfactory condition to undergo the procedure.                           After obtaining informed consent, the endoscope was  passed under direct vision. Throughout the                            procedure, the patient's blood pressure, pulse, and                            oxygen saturations were monitored continuously. The                            GIF [id number] was introduced through the                            mouth, and advanced to the second part of duodenum.                            The upper GI  endoscopy was accomplished without                            difficulty. The patient tolerated the procedure                            well. Scope In: Scope Out: Findings:                 Grade I, small (< 5 mm) varices were found in the                            distal esophagus. They were 4 mm in largest                            diameter. Estimated blood loss: none.                           Moderate portal hypertensive gastropathy was found                            in the cardia, in the gastric fundus and in the                            gastric body.                           Diffuse moderately erythematous mucosa without                            bleeding was found in the gastric antrum. Biopsies                            were taken with a cold forceps for histology.                            Verification of patient identification for the                            specimen was done.  Estimated blood loss was minimal.                           The examined duodenum was normal.                           The cardia and gastric fundus were normal on                            retroflexion. Complications:            No immediate complications. Estimated Blood Loss:     Estimated blood loss was minimal. Impression:               - Grade I and small (< 5 mm) esophageal varices.                           - Portal hypertensive gastropathy.                           - Erythematous mucosa in the antrum. Biopsied.                            Gastritis vs portal gastropathy                           - Normal examined duodenum. Recommendation:           - Patient has a contact number available for                            emergencies. The signs and symptoms of potential                            delayed complications were discussed with the                            patient. Return to normal activities tomorrow.                            Written discharge instructions were  provided to the                            patient.                           - Resume previous diet.                           - Continue present medications.                           - Resume Eliquis (apixaban) at prior dose tomorrow.                           - Await pathology results.                           -  See the other procedure note for documentation of                            additional recommendations. Colonoscopy next Gatha Mayer, MD 11/27/2021 2:27:42 PM This report has been signed electronically.

## 2021-11-28 ENCOUNTER — Telehealth: Payer: Self-pay

## 2021-11-28 NOTE — Telephone Encounter (Signed)
  Follow up Call-     11/27/2021   12:41 PM  Call back number  Post procedure Call Back phone  # [phone]  Permission to leave phone message Yes     Patient questions:  Do you have a fever, pain , or abdominal swelling? No. Pain Score  0 *  Have you tolerated food without any problems? Yes.    Have you been able to return to your normal activities? Yes.    Do you have any questions about your discharge instructions: Diet   No. Medications  No. Follow up visit  No.  Do you have questions or concerns about your Care? No Patient c/o gas pains instructed she could take over the counter gas relief medications. Instructed to call back if she doesn't get relief.        Actions: * If pain score is 4 or above: No action needed, pain <4.

## 2021-12-03 ENCOUNTER — Encounter: Payer: Self-pay | Admitting: Internal Medicine

## 2021-12-04 ENCOUNTER — Other Ambulatory Visit: Payer: Self-pay

## 2021-12-04 ENCOUNTER — Telehealth: Payer: Self-pay

## 2021-12-04 DIAGNOSIS — K7460 Unspecified cirrhosis of liver: Secondary | ICD-10-CM

## 2021-12-04 NOTE — Telephone Encounter (Signed)
-----   Message from Gatha Mayer, MD sent at 12/03/2021  9:34 PM EDT ----- Regarding: liver US reminder Please place a reminder (or call and schedule now?) for RUQ [place] - needed in 01/2022  Dx cirrhosis

## 2021-12-04 NOTE — Telephone Encounter (Signed)
Pt was made aware of Dr. Carlean Purl recommendations:  Pt was scheduled for the [place] on 01/22/2022 at 8:00 AM at [facility]: Pt to arrive at [facility] at 7:30 AM : Nothing to eat or drink past midnight: Pt made aware: Pt verbalized understanding with all questions answered.  Reminder placed in Epic For Oct 31 to contact and remind pt:

## 2021-12-08 ENCOUNTER — Telehealth: Payer: Self-pay | Admitting: Diagnostic Neuroimaging

## 2021-12-08 ENCOUNTER — Ambulatory Visit: Payer: Medicare Other | Admitting: Diagnostic Neuroimaging

## 2021-12-08 NOTE — Telephone Encounter (Signed)
Contacted spouse and patient back, she stated this is the only episode she has had that bad since last visit. She had sever tremors, could hardly walk and couldn't properly use the restroom during the night. Advised pt during last visit Dr Leta Baptist stated he may consider carbo/levo, pt agreed to try medication and will FU with Dr Leta Baptist on 10/3. As work in are you willing to get pt started on carbo/levo? Or have any other recommendations for pt? Please advise

## 2021-12-08 NOTE — Telephone Encounter (Signed)
Spouse is asking for a call to discuss pt waking up to a bad tremor episode.  Spouse agreed to wait list.  Spouse would like a call to address this concern

## 2021-12-09 NOTE — Telephone Encounter (Signed)
Contact pt and spouse back, spouse stated patient returned back to baseline around 1 PM yesterday.  I gave him Dr. Cathren Laine recommendations, since patient has a visit coming up following week of Dr Leta Baptist return, they will keep the follow-up and see him on October 3. Pt was appreciative

## 2021-12-16 ENCOUNTER — Other Ambulatory Visit: Payer: Self-pay | Admitting: Cardiology

## 2021-12-23 ENCOUNTER — Encounter: Payer: Self-pay | Admitting: Diagnostic Neuroimaging

## 2021-12-23 ENCOUNTER — Ambulatory Visit (INDEPENDENT_AMBULATORY_CARE_PROVIDER_SITE_OTHER): Payer: Medicare Other | Admitting: Diagnostic Neuroimaging

## 2021-12-23 VITALS — BP 144/67 | HR 89 | Ht 65.0 in | Wt 182.0 lb

## 2021-12-23 DIAGNOSIS — R251 Tremor, unspecified: Secondary | ICD-10-CM | POA: Diagnosis not present

## 2021-12-23 NOTE — Progress Notes (Signed)
[facility]  PATIENT: Savannah Hobbs DOB: 1945/06/27  REFERRING CLINICIAN: Moshe Cipro, MD HISTORY FROM: patient  REASON FOR VISIT: follow up   HISTORICAL  CHIEF COMPLAINT:  Chief Complaint  Patient presents with   Follow-up    Rm 7 with spouse craig  Pt is well, wants to discuss recent call 9/18 regarding tremor episode. No sever episodes since then.     HISTORY OF PRESENT ILLNESS:   UPDATE (12/23/21, VRP): Since last visit, doing well until 12/08/21. Had another similar event as 03/27/21. Tremors lasting 5 hours. Both events occurred after visiting with family, but no major stress. Otherwise doing well.  PRIOR HPI (04/08/21): 76 year old female here for evaluation of abnormal spell.  03/27/2021 patient woke up with bilateral hand tremors, left-sided weakness greater than right-sided weakness.  Symptoms lasted 5 to 6 hours.  She went to the hospital for evaluation.  She was admitted for TIA work-up.  MRI showed no acute findings.  Left ICA stenosis of 50% was noted.  Other testing was unremarkable.  She was diagnosed with possible TIA and discharged home.  On further questioning patient has had gradual onset and progressive bilateral upper extremity tremors, short shuffling steps, hoarse soft voice, slow movements for last 6 to 12 months.  No change in smell or taste.  Some mild drooling at times.  No significant anxiety.  No acting out her dreams.   REVIEW OF SYSTEMS: Full 14 system review of systems performed and negative with exception of: as per HPI.  ALLERGIES: Allergies  Allergen Reactions   Shellfish Allergy Hives    HOME MEDICATIONS: Outpatient Medications Prior to Visit  Medication Sig Dispense Refill   acetaminophen (TYLENOL) 500 MG tablet Take 500 mg by mouth every 6 (six) hours as needed for moderate pain.     apixaban (ELIQUIS) 5 MG TABS tablet Take 1 tablet (5 mg total) by mouth 2 (two) times daily. 180 tablet 1   Ascorbic Acid  (VITAMIN C) 1000 MG tablet Take 1,000 mg by mouth daily.     cetirizine (ZYRTEC) 10 MG tablet Take 10 mg by mouth in the morning.     diltiazem (CARDIZEM CD) 120 MG 24 hr capsule Take 1 capsule (120 mg total) by mouth daily. 90 capsule 3   docusate sodium (COLACE) 100 MG capsule Take 100 mg by mouth 2 (two) times daily.     esomeprazole (NEXIUM) 20 MG capsule Take 20 mg by mouth daily before breakfast.     flecainide (TAMBOCOR) 100 MG tablet Take 2 tablets (200 mg total) by mouth as directed. Take 2 tablets as needed daily for Afib 10 tablet 1   furosemide (LASIX) 40 MG tablet TAKE 1 TABLET BY MOUTH EVERY DAY 90 tablet 1   simvastatin (ZOCOR) 20 MG tablet Take 20 mg by mouth daily.     spironolactone (ALDACTONE) 100 MG tablet Take 1 tablet (100 mg total) by mouth daily. 30 tablet 0   vitamin B-12 (CYANOCOBALAMIN) 1000 MCG tablet Take 1,000 mcg by mouth daily.     No facility-administered medications prior to visit.    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[place]) 11/27/2021   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[place]) 11/27/2021   Thrombocytopenia (HCC)    Typical atrial flutter (HCC)    UTI (lower urinary tract infection)     PAST SURGICAL HISTORY: Past Surgical History:  Procedure Laterality Date   ABDOMINAL HYSTERECTOMY     partial   APPENDECTOMY     ATRIAL FIBRILLATION ABLATION N/A 09/26/2020   Procedure: ATRIAL FIBRILLATION ABLATION;   Surgeon: Thompson Grayer, MD;  Location: [facility];  Service: Cardiovascular;  Laterality: N/A;   COLONOSCOPY     ESOPHAGOGASTRODUODENOSCOPY     GYNECOLOGIC CRYOSURGERY     x2   IR US GUIDE BX ASP/DRAIN  01/29/2021   REPLACEMENT TOTAL KNEE Left 2016   TONSILLECTOMY     TOTAL KNEE ARTHROPLASTY Right    TUBAL LIGATION     UPPER GASTROINTESTINAL ENDOSCOPY      FAMILY HISTORY: Family History  Problem Relation Age of Onset   Hypertension Mother    Esophageal cancer Father    Colon polyps Neg Hx    Colon cancer Neg Hx    Rectal cancer Neg Hx    Stomach cancer Neg Hx     SOCIAL HISTORY: Social History   Socioeconomic History   Marital status: Married    Spouse name: Not on file   Number of children: 3   Years of education: Nursing degree   Highest education level: Not on file  Occupational History   Occupation: retired Therapist, sports  Tobacco Use   Smoking status: Never   Smokeless tobacco: Never  Substance and Sexual Activity   Alcohol use: Yes    Alcohol/week: 1.0 standard drink of alcohol    Types: 1 Glasses of wine per week   Drug use: No   Sexual activity: Not on file  Other Topics Concern   Not on file  Social History Narrative   Married, 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   3 sons, 11-Nov-2015 1 son died in his sleep unclear cause   Right handed    Some caffeine    Social Determinants of Health   Financial Resource Strain: Not on file  Food Insecurity: Not on file  Transportation Needs: Not on file  Physical Activity: Not on file  Stress: Not on file  Social Connections: Not on file  Intimate Partner Violence: Not on file     PHYSICAL EXAM  GENERAL EXAM/CONSTITUTIONAL: Vitals:  Vitals:   12/23/21 1427  BP: (!) 144/67  Pulse: 89  Weight: 182 lb (82.6 kg)  Height: '5\' 5"'$  (1.651 m)   Body mass index is 30.29 kg/m. Wt Readings from Last 3 Encounters:  12/23/21 182 lb (82.6 kg)  11/27/21 185 lb (83.9 kg)   10/17/21 183 lb 9.6 oz (83.3 kg)   Patient is in no distress; well developed, nourished and groomed; neck is supple  CARDIOVASCULAR: Examination of carotid arteries is normal; no carotid bruits Regular rate and rhythm, SYSTOLIC MURMUR LEFT PRECORDIAL REGION Examination of peripheral vascular system by observation and palpation is normal  EYES: Ophthalmoscopic exam of optic discs and posterior segments is normal; no papilledema or hemorrhages No results found.  MUSCULOSKELETAL: Gait, strength, tone, movements noted in Neurologic  exam below  NEUROLOGIC: MENTAL STATUS:      No data to display         awake, alert, oriented to person, place and time recent and remote memory intact normal attention and concentration language fluent, comprehension intact, naming intact fund of knowledge appropriate  CRANIAL NERVE:  2nd - no papilledema on fundoscopic exam 2nd, 3rd, 4th, 6th - pupils equal and reactive to light, visual fields full to confrontation, extraocular muscles intact, no nystagmus 5th - facial sensation symmetric 7th - facial strength symmetric 8th - hearing intact 9th - palate elevates symmetrically, uvula midline 11th - shoulder shrug symmetric 12th - tongue protrusion midline SOFT HOARSE VOICE MILD HYPOMIMIA  MOTOR:  MINIMAL POSTURAL TREMOR NO RESTING TREMOR; NO RIGIDITY; NO BRADYKINESIA normal bulk and tone, full strength in the BUE, BLE  SENSORY:  normal and symmetric to light touch, temperature, vibration  COORDINATION:  finger-nose-finger, fine finger movements SLOWER ON LEFT  REFLEXES:  deep tendon reflexes TRACE and symmetric  GAIT/STATION:  narrow based gait; SHORT STEPS     DIAGNOSTIC DATA (LABS, IMAGING, TESTING) - I reviewed patient records, labs, notes, testing and imaging myself where available.  Lab Results  Component Value Date   WBC 23.2 (H) 02/02/2021   HGB 10.6 (L) 02/02/2021   HCT 33.0 (L) 02/02/2021   MCV 91.9 02/02/2021    PLT 165 02/02/2021      Component Value Date/Time   NA 141 03/25/2021 1614   K 4.3 03/25/2021 1614   CL 101 03/25/2021 1614   CO2 24 03/25/2021 1614   GLUCOSE 66 (L) 03/25/2021 1614   GLUCOSE 89 02/02/2021 0353   BUN 14 03/25/2021 1614   CREATININE 0.49 (L) 03/25/2021 1614   CALCIUM 9.2 03/25/2021 1614   PROT 7.5 03/25/2021 1614   ALBUMIN 3.8 03/25/2021 1614   AST 37 03/25/2021 1614   ALT 20 03/25/2021 1614   ALKPHOS 127 (H) 03/25/2021 1614   BILITOT 1.3 (H) 03/25/2021 1614   GFRNONAA >60 02/02/2021 0353   GFRAA 118 03/05/2020 1629   Lab Results  Component Value Date   CHOL 160 03/25/2021   HDL 61 03/25/2021   LDLCALC 82 03/25/2021   TRIG 94 03/25/2021   CHOLHDL 2.6 03/25/2021   No results found for: "HGBA1C" No results found for: "VITAMINB12" Lab Results  Component Value Date   TSH 1.912 01/22/2020    04/24/21 DATscan -Normal Ioflupane scan. No reduced radiotracer activity in basal ganglia to suggest Parkinson's syndrome pathology. -Of note, DaTSCAN is not diagnostic of Parkinsonian syndromes, which remains a clinical diagnosis. DaTscan is an adjuvant test to aid in the clinical diagnosis of Parkinsonian syndromes.    ASSESSMENT AND PLAN  76 y.o. year old female here with mild tremor events in Jan and Sept 2023.   Dx:  1. Tremor      PLAN:  BILATERAL TREMOR / WEAKNESS EVENTS ON 03/27/21 and 12/08/21 (unlikely TIA; ? Stress reaction) - continue to optimize sleep, stress mgmt, nutrition and exercise - continue eliquis, rosuvastatin for stroke prevention  Return for return to PCP, pending if symptoms worsen or fail to improve.    Penni Bombard, MD 18/10/4164, 0:63 PM Certified in Neurology, Neurophysiology and Neuroimaging  [facility] [address], [place],  [address] [phone]

## 2022-01-22 ENCOUNTER — Encounter (HOSPITAL_COMMUNITY): Payer: Self-pay | Admitting: Emergency Medicine

## 2022-01-22 ENCOUNTER — Other Ambulatory Visit: Payer: Self-pay

## 2022-01-22 ENCOUNTER — Inpatient Hospital Stay (HOSPITAL_COMMUNITY)
Admission: EM | Admit: 2022-01-22 | Discharge: 2022-01-26 | DRG: 602 | Disposition: A | Discharge status: Home Health | Payer: Medicare Other | Attending: Family Medicine | Admitting: Family Medicine

## 2022-01-22 ENCOUNTER — Ambulatory Visit (HOSPITAL_COMMUNITY): Payer: Medicare Other

## 2022-01-22 DIAGNOSIS — J9601 Acute respiratory failure with hypoxia: Secondary | ICD-10-CM | POA: Diagnosis present

## 2022-01-22 DIAGNOSIS — I10 Essential (primary) hypertension: Secondary | ICD-10-CM | POA: Diagnosis present

## 2022-01-22 DIAGNOSIS — E871 Hypo-osmolality and hyponatremia: Secondary | ICD-10-CM | POA: Diagnosis present

## 2022-01-22 DIAGNOSIS — E669 Obesity, unspecified: Secondary | ICD-10-CM | POA: Diagnosis present

## 2022-01-22 DIAGNOSIS — Z96653 Presence of artificial knee joint, bilateral: Secondary | ICD-10-CM | POA: Diagnosis present

## 2022-01-22 DIAGNOSIS — E785 Hyperlipidemia, unspecified: Secondary | ICD-10-CM | POA: Diagnosis present

## 2022-01-22 DIAGNOSIS — L02415 Cutaneous abscess of right lower limb: Secondary | ICD-10-CM | POA: Diagnosis present

## 2022-01-22 DIAGNOSIS — L03119 Cellulitis of unspecified part of limb: Secondary | ICD-10-CM | POA: Diagnosis present

## 2022-01-22 DIAGNOSIS — K7460 Unspecified cirrhosis of liver: Secondary | ICD-10-CM | POA: Diagnosis present

## 2022-01-22 DIAGNOSIS — Z90711 Acquired absence of uterus with remaining cervical stump: Secondary | ICD-10-CM

## 2022-01-22 DIAGNOSIS — Z79899 Other long term (current) drug therapy: Secondary | ICD-10-CM

## 2022-01-22 DIAGNOSIS — D6869 Other thrombophilia: Secondary | ICD-10-CM | POA: Diagnosis present

## 2022-01-22 DIAGNOSIS — K7581 Nonalcoholic steatohepatitis (NASH): Secondary | ICD-10-CM | POA: Diagnosis present

## 2022-01-22 DIAGNOSIS — D471 Chronic myeloproliferative disease: Secondary | ICD-10-CM | POA: Diagnosis present

## 2022-01-22 DIAGNOSIS — M109 Gout, unspecified: Secondary | ICD-10-CM | POA: Diagnosis present

## 2022-01-22 DIAGNOSIS — D469 Myelodysplastic syndrome, unspecified: Secondary | ICD-10-CM | POA: Diagnosis present

## 2022-01-22 DIAGNOSIS — L03115 Cellulitis of right lower limb: Principal | ICD-10-CM | POA: Diagnosis present

## 2022-01-22 DIAGNOSIS — Z6830 Body mass index (BMI) 30.0-30.9, adult: Secondary | ICD-10-CM

## 2022-01-22 DIAGNOSIS — Z91013 Allergy to seafood: Secondary | ICD-10-CM

## 2022-01-22 DIAGNOSIS — I480 Paroxysmal atrial fibrillation: Secondary | ICD-10-CM | POA: Diagnosis present

## 2022-01-22 DIAGNOSIS — Z7901 Long term (current) use of anticoagulants: Secondary | ICD-10-CM

## 2022-01-22 DIAGNOSIS — Z8616 Personal history of COVID-19: Secondary | ICD-10-CM

## 2022-01-22 DIAGNOSIS — N179 Acute kidney failure, unspecified: Secondary | ICD-10-CM | POA: Diagnosis present

## 2022-01-22 DIAGNOSIS — Z9851 Tubal ligation status: Secondary | ICD-10-CM

## 2022-01-22 DIAGNOSIS — Z8249 Family history of ischemic heart disease and other diseases of the circulatory system: Secondary | ICD-10-CM

## 2022-01-22 LAB — BASIC METABOLIC PANEL
Anion gap: 13 (ref 5–15)
BUN: 23 mg/dL (ref 8–23)
CO2: 22 mmol/L (ref 22–32)
Calcium: 8.5 mg/dL — ABNORMAL LOW (ref 8.9–10.3)
Chloride: 99 mmol/L (ref 98–111)
Creatinine, Ser: 1 mg/dL (ref 0.44–1.00)
GFR, Estimated: 59 mL/min — ABNORMAL LOW (ref >60)
Glucose, Bld: 92 mg/dL (ref 70–99)
Potassium: 4.1 mmol/L (ref 3.5–5.1)
Sodium: 134 mmol/L — ABNORMAL LOW (ref 135–145)

## 2022-01-22 LAB — CBC WITH DIFFERENTIAL/PLATELET
Abs Immature Granulocytes: 5.1 10*3/uL — ABNORMAL HIGH (ref 0.00–0.07)
Basophils Absolute: 0.4 10*3/uL — ABNORMAL HIGH (ref 0.0–0.1)
Basophils Relative: 1 %
Eosinophils Absolute: 0 10*3/uL (ref 0.0–0.5)
Eosinophils Relative: 0 %
HCT: 40.3 % (ref 36.0–46.0)
Hemoglobin: 13.7 g/dL (ref 12.0–15.0)
Lymphocytes Relative: 4 %
Lymphs Abs: 1.7 10*3/uL (ref 0.7–4.0)
MCH: 31.6 pg (ref 26.0–34.0)
MCHC: 34 g/dL (ref 30.0–36.0)
MCV: 93.1 fL (ref 80.0–100.0)
Metamyelocytes Relative: 4 %
Monocytes Absolute: 0.8 10*3/uL (ref 0.1–1.0)
Monocytes Relative: 2 %
Myelocytes: 4 %
Neutro Abs: 34.3 10*3/uL — ABNORMAL HIGH (ref 1.7–7.7)
Neutrophils Relative %: 81 %
Platelets: 222 10*3/uL (ref 150–400)
Promyelocytes Relative: 4 %
RBC: 4.33 MIL/uL (ref 3.87–5.11)
RDW: 15 % (ref 11.5–15.5)
WBC: 42.3 10*3/uL — ABNORMAL HIGH (ref 4.0–10.5)
nRBC: 0 % (ref 0.0–0.2)

## 2022-01-22 LAB — LACTIC ACID, PLASMA: Lactic Acid, Venous: 1.6 mmol/L (ref 0.5–1.9)

## 2022-01-22 NOTE — ED Triage Notes (Signed)
Patient reports persistent swelling at right lower leg for several days , prescribed with oral antibiotics with no improvement , no fever or chills .

## 2022-01-22 NOTE — ED Provider Triage Note (Signed)
Emergency Medicine Provider Triage Evaluation Note  Savannah Hobbs , a 76 y.o. female  was evaluated in triage.  Pt complains of right lower extremity swelling and redness.  Symptoms started about 5 to 6 days ago.  No injury.  Initially thought that it could be gout and treated with colchicine.  She also had a ultrasound performed which she reports is negative for DVT.  She is on Eliquis for paroxysmal atrial fibrillation.  No fevers.  Redness has expanded.  She developed some wounds on the outside of her right ankle in the past day or 2.  Review of Systems  Positive: Redness, warmth, lower extremity swelling Negative: Fever  Physical Exam  BP (!) 144/45 (BP Location: Right Arm)   Pulse 93   Temp 98 F (36.7 C) (Oral)   Resp 18   SpO2 97%  Gen:   Awake, no distress   Resp:  Normal effort  MSK:   Moves extremities without difficulty  Other:  Edema to the right lower extremity with erythema extending from the foot up to the knee, appears cellulitic in nature  Medical Decision Making  Medically screening exam initiated at 7:03 PM.  Appropriate orders placed.  [name] was informed that the remainder of the evaluation will be completed by another provider, this initial triage assessment does not replace that evaluation, and the importance of remaining in the ED until their evaluation is complete.     Carlisle Cater, PA-C 01/22/22 1905

## 2022-01-23 ENCOUNTER — Inpatient Hospital Stay (HOSPITAL_COMMUNITY): Payer: Medicare Other

## 2022-01-23 ENCOUNTER — Encounter (HOSPITAL_COMMUNITY): Payer: Self-pay | Admitting: Internal Medicine

## 2022-01-23 ENCOUNTER — Emergency Department (HOSPITAL_COMMUNITY): Payer: Medicare Other

## 2022-01-23 ENCOUNTER — Observation Stay (HOSPITAL_COMMUNITY): Payer: Medicare Other

## 2022-01-23 DIAGNOSIS — N179 Acute kidney failure, unspecified: Secondary | ICD-10-CM | POA: Diagnosis present

## 2022-01-23 DIAGNOSIS — I10 Essential (primary) hypertension: Secondary | ICD-10-CM | POA: Diagnosis present

## 2022-01-23 DIAGNOSIS — Z9851 Tubal ligation status: Secondary | ICD-10-CM | POA: Diagnosis not present

## 2022-01-23 DIAGNOSIS — E782 Mixed hyperlipidemia: Secondary | ICD-10-CM | POA: Diagnosis not present

## 2022-01-23 DIAGNOSIS — I480 Paroxysmal atrial fibrillation: Secondary | ICD-10-CM | POA: Diagnosis present

## 2022-01-23 DIAGNOSIS — Z6830 Body mass index (BMI) 30.0-30.9, adult: Secondary | ICD-10-CM | POA: Diagnosis not present

## 2022-01-23 DIAGNOSIS — D6869 Other thrombophilia: Secondary | ICD-10-CM | POA: Diagnosis present

## 2022-01-23 DIAGNOSIS — L03119 Cellulitis of unspecified part of limb: Secondary | ICD-10-CM | POA: Diagnosis present

## 2022-01-23 DIAGNOSIS — E785 Hyperlipidemia, unspecified: Secondary | ICD-10-CM | POA: Diagnosis present

## 2022-01-23 DIAGNOSIS — E669 Obesity, unspecified: Secondary | ICD-10-CM | POA: Diagnosis present

## 2022-01-23 DIAGNOSIS — M109 Gout, unspecified: Secondary | ICD-10-CM | POA: Diagnosis present

## 2022-01-23 DIAGNOSIS — D471 Chronic myeloproliferative disease: Secondary | ICD-10-CM | POA: Diagnosis present

## 2022-01-23 DIAGNOSIS — D469 Myelodysplastic syndrome, unspecified: Secondary | ICD-10-CM | POA: Diagnosis present

## 2022-01-23 DIAGNOSIS — J9601 Acute respiratory failure with hypoxia: Secondary | ICD-10-CM | POA: Diagnosis present

## 2022-01-23 DIAGNOSIS — Z90711 Acquired absence of uterus with remaining cervical stump: Secondary | ICD-10-CM | POA: Diagnosis not present

## 2022-01-23 DIAGNOSIS — Z79899 Other long term (current) drug therapy: Secondary | ICD-10-CM | POA: Diagnosis not present

## 2022-01-23 DIAGNOSIS — L02415 Cutaneous abscess of right lower limb: Secondary | ICD-10-CM | POA: Diagnosis present

## 2022-01-23 DIAGNOSIS — K7460 Unspecified cirrhosis of liver: Secondary | ICD-10-CM | POA: Diagnosis present

## 2022-01-23 DIAGNOSIS — Z7901 Long term (current) use of anticoagulants: Secondary | ICD-10-CM | POA: Diagnosis not present

## 2022-01-23 DIAGNOSIS — E871 Hypo-osmolality and hyponatremia: Secondary | ICD-10-CM | POA: Diagnosis present

## 2022-01-23 DIAGNOSIS — L03115 Cellulitis of right lower limb: Secondary | ICD-10-CM | POA: Diagnosis present

## 2022-01-23 DIAGNOSIS — Z96653 Presence of artificial knee joint, bilateral: Secondary | ICD-10-CM | POA: Diagnosis present

## 2022-01-23 DIAGNOSIS — Z8249 Family history of ischemic heart disease and other diseases of the circulatory system: Secondary | ICD-10-CM | POA: Diagnosis not present

## 2022-01-23 DIAGNOSIS — Z8616 Personal history of COVID-19: Secondary | ICD-10-CM | POA: Diagnosis not present

## 2022-01-23 DIAGNOSIS — K7581 Nonalcoholic steatohepatitis (NASH): Secondary | ICD-10-CM | POA: Diagnosis present

## 2022-01-23 DIAGNOSIS — Z91013 Allergy to seafood: Secondary | ICD-10-CM | POA: Diagnosis not present

## 2022-01-23 LAB — BASIC METABOLIC PANEL
Anion gap: 9 (ref 5–15)
BUN: 16 mg/dL (ref 8–23)
CO2: 22 mmol/L (ref 22–32)
Calcium: 8.2 mg/dL — ABNORMAL LOW (ref 8.9–10.3)
Chloride: 103 mmol/L (ref 98–111)
Creatinine, Ser: 0.6 mg/dL (ref 0.44–1.00)
GFR, Estimated: >60 mL/min (ref >60)
Glucose, Bld: 106 mg/dL — ABNORMAL HIGH (ref 70–99)
Potassium: 4.1 mmol/L (ref 3.5–5.1)
Sodium: 134 mmol/L — ABNORMAL LOW (ref 135–145)

## 2022-01-23 LAB — CBC WITH DIFFERENTIAL/PLATELET
Abs Immature Granulocytes: 0 10*3/uL (ref 0.00–0.07)
Basophils Absolute: 0.7 10*3/uL — ABNORMAL HIGH (ref 0.0–0.1)
Basophils Relative: 2 %
Eosinophils Absolute: 0 10*3/uL (ref 0.0–0.5)
Eosinophils Relative: 0 %
HCT: 35.8 % — ABNORMAL LOW (ref 36.0–46.0)
Hemoglobin: 12 g/dL (ref 12.0–15.0)
Lymphocytes Relative: 4 %
Lymphs Abs: 1.4 10*3/uL (ref 0.7–4.0)
MCH: 31.2 pg (ref 26.0–34.0)
MCHC: 33.5 g/dL (ref 30.0–36.0)
MCV: 93 fL (ref 80.0–100.0)
Monocytes Absolute: 0.7 10*3/uL (ref 0.1–1.0)
Monocytes Relative: 2 %
Neutro Abs: 32.6 10*3/uL — ABNORMAL HIGH (ref 1.7–7.7)
Neutrophils Relative %: 92 %
Platelets: 212 10*3/uL (ref 150–400)
RBC: 3.85 MIL/uL — ABNORMAL LOW (ref 3.87–5.11)
RDW: 15 % (ref 11.5–15.5)
WBC: 35.4 10*3/uL — ABNORMAL HIGH (ref 4.0–10.5)
nRBC: 0 % (ref 0.0–0.2)
nRBC: 0 /100 WBC

## 2022-01-23 LAB — PATHOLOGIST SMEAR REVIEW

## 2022-01-23 MED ORDER — BISACODYL 5 MG PO TBEC: 5.0000 mg | DELAYED_RELEASE_TABLET | Freq: Every day | ORAL | Status: DC | PRN

## 2022-01-23 MED ORDER — MORPHINE SULFATE (PF) 4 MG/ML IV SOLN
4.0000 mg | Freq: Once | INTRAVENOUS | Status: AC
  Administered 2022-01-23: 4 mg via INTRAVENOUS
  Filled 2022-01-23: qty 1

## 2022-01-23 MED ORDER — PANTOPRAZOLE SODIUM 40 MG PO TBEC
40.0000 mg | DELAYED_RELEASE_TABLET | Freq: Every day | ORAL | Status: DC
  Administered 2022-01-23 – 2022-01-26 (×4): 40 mg via ORAL
  Filled 2022-01-23 (×4): qty 1

## 2022-01-23 MED ORDER — DILTIAZEM HCL ER COATED BEADS 120 MG PO CP24
120.0000 mg | ORAL_CAPSULE | Freq: Every day | ORAL | Status: DC
  Administered 2022-01-23 – 2022-01-26 (×4): 120 mg via ORAL
  Filled 2022-01-23 (×4): qty 1

## 2022-01-23 MED ORDER — SODIUM CHLORIDE 0.9% FLUSH
3.0000 mL | Freq: Two times a day (BID) | INTRAVENOUS | Status: DC
  Administered 2022-01-23 – 2022-01-26 (×7): 3 mL via INTRAVENOUS

## 2022-01-23 MED ORDER — ACETAMINOPHEN 650 MG RE SUPP: 650.0000 mg | Freq: Four times a day (QID) | RECTAL | Status: DC | PRN

## 2022-01-23 MED ORDER — VANCOMYCIN HCL 1250 MG/250ML IV SOLN
1250.0000 mg | INTRAVENOUS | Status: DC
  Administered 2022-01-24 – 2022-01-25 (×2): 1250 mg via INTRAVENOUS
  Filled 2022-01-23 (×3): qty 250

## 2022-01-23 MED ORDER — ONDANSETRON HCL 4 MG PO TABS
4.0000 mg | ORAL_TABLET | Freq: Four times a day (QID) | ORAL | Status: DC | PRN
  Administered 2022-01-23: 4 mg via ORAL
  Filled 2022-01-23: qty 1

## 2022-01-23 MED ORDER — SPIRONOLACTONE 25 MG PO TABS
100.0000 mg | ORAL_TABLET | Freq: Every day | ORAL | Status: DC
  Administered 2022-01-23 – 2022-01-26 (×4): 100 mg via ORAL
  Filled 2022-01-23 (×4): qty 4

## 2022-01-23 MED ORDER — POLYETHYLENE GLYCOL 3350 17 G PO PACK: 17.0000 g | PACK | Freq: Every day | ORAL | Status: DC | PRN

## 2022-01-23 MED ORDER — APIXABAN 5 MG PO TABS
5.0000 mg | ORAL_TABLET | Freq: Two times a day (BID) | ORAL | Status: DC
  Administered 2022-01-23 – 2022-01-26 (×6): 5 mg via ORAL
  Filled 2022-01-23 (×6): qty 1

## 2022-01-23 MED ORDER — SODIUM CHLORIDE 0.9 % IV SOLN: Freq: Once | INTRAVENOUS | Status: AC

## 2022-01-23 MED ORDER — ATORVASTATIN CALCIUM 10 MG PO TABS
10.0000 mg | ORAL_TABLET | Freq: Every day | ORAL | Status: DC
  Administered 2022-01-25: 10 mg via ORAL
  Filled 2022-01-23 (×3): qty 1

## 2022-01-23 MED ORDER — LACTATED RINGERS IV SOLN: INTRAVENOUS | Status: DC

## 2022-01-23 MED ORDER — APIXABAN 5 MG PO TABS: 5.0000 mg | ORAL_TABLET | Freq: Two times a day (BID) | ORAL | Status: DC

## 2022-01-23 MED ORDER — HYDRALAZINE HCL 20 MG/ML IJ SOLN: 5.0000 mg | INTRAMUSCULAR | Status: DC | PRN

## 2022-01-23 MED ORDER — IOHEXOL 350 MG/ML SOLN
75.0000 mL | Freq: Once | INTRAVENOUS | Status: AC | PRN
  Administered 2022-01-23: 75 mL via INTRAVENOUS

## 2022-01-23 MED ORDER — FENTANYL CITRATE PF 50 MCG/ML IJ SOSY
25.0000 ug | PREFILLED_SYRINGE | Freq: Once | INTRAMUSCULAR | Status: AC
  Administered 2022-01-23: 25 ug via INTRAVENOUS
  Filled 2022-01-23: qty 1

## 2022-01-23 MED ORDER — SODIUM CHLORIDE 0.9 % IV SOLN
2.0000 g | Freq: Once | INTRAVENOUS | Status: AC
  Administered 2022-01-23: 2 g via INTRAVENOUS
  Filled 2022-01-23: qty 12.5

## 2022-01-23 MED ORDER — SIMVASTATIN 20 MG PO TABS
20.0000 mg | ORAL_TABLET | Freq: Every day | ORAL | Status: DC
  Administered 2022-01-23: 20 mg via ORAL
  Filled 2022-01-23: qty 1

## 2022-01-23 MED ORDER — DOCUSATE SODIUM 100 MG PO CAPS
100.0000 mg | ORAL_CAPSULE | Freq: Two times a day (BID) | ORAL | Status: DC
  Administered 2022-01-24 – 2022-01-26 (×5): 100 mg via ORAL
  Filled 2022-01-23 (×7): qty 1

## 2022-01-23 MED ORDER — ONDANSETRON HCL 4 MG/2ML IJ SOLN: 4.0000 mg | Freq: Four times a day (QID) | INTRAMUSCULAR | Status: DC | PRN

## 2022-01-23 MED ORDER — MORPHINE SULFATE (PF) 2 MG/ML IV SOLN
2.0000 mg | Freq: Once | INTRAVENOUS | Status: AC
  Administered 2022-01-23: 2 mg via INTRAVENOUS
  Filled 2022-01-23: qty 1

## 2022-01-23 MED ORDER — ACETAMINOPHEN 325 MG PO TABS
650.0000 mg | ORAL_TABLET | Freq: Four times a day (QID) | ORAL | Status: DC | PRN
  Administered 2022-01-24 – 2022-01-26 (×6): 650 mg via ORAL
  Filled 2022-01-23 (×6): qty 2

## 2022-01-23 MED ORDER — LORATADINE 10 MG PO TABS
10.0000 mg | ORAL_TABLET | Freq: Every day | ORAL | Status: DC
  Administered 2022-01-23 – 2022-01-26 (×4): 10 mg via ORAL
  Filled 2022-01-23 (×4): qty 1

## 2022-01-23 MED ORDER — OXYCODONE HCL 5 MG PO TABS
5.0000 mg | ORAL_TABLET | ORAL | Status: DC | PRN
  Administered 2022-01-23 – 2022-01-25 (×7): 5 mg via ORAL
  Filled 2022-01-23 (×7): qty 1

## 2022-01-23 MED ORDER — FUROSEMIDE 40 MG PO TABS
40.0000 mg | ORAL_TABLET | Freq: Every day | ORAL | Status: DC
  Administered 2022-01-23 – 2022-01-26 (×4): 40 mg via ORAL
  Filled 2022-01-23 (×4): qty 1

## 2022-01-23 MED ORDER — MORPHINE SULFATE (PF) 2 MG/ML IV SOLN: 2.0000 mg | INTRAVENOUS | Status: DC | PRN

## 2022-01-23 MED ORDER — VANCOMYCIN HCL IN DEXTROSE 1-5 GM/200ML-% IV SOLN
1000.0000 mg | Freq: Once | INTRAVENOUS | Status: AC
  Administered 2022-01-23: 1000 mg via INTRAVENOUS
  Filled 2022-01-23: qty 200

## 2022-01-23 MED ORDER — LACTATED RINGERS IV BOLUS
500.0000 mL | Freq: Once | INTRAVENOUS | Status: AC
  Administered 2022-01-23: 500 mL via INTRAVENOUS

## 2022-01-23 MED ORDER — SODIUM CHLORIDE 0.9 % IV SOLN
2.0000 g | Freq: Two times a day (BID) | INTRAVENOUS | Status: DC
  Administered 2022-01-23 – 2022-01-25 (×4): 2 g via INTRAVENOUS
  Filled 2022-01-23 (×4): qty 12.5

## 2022-01-23 NOTE — H&P (Signed)
History and Physical    Patient: Savannah Hobbs BSJ:[id number] DOB: Apr 12, 1945 DOA: 01/22/2022 DOS: the patient was seen and examined on 01/23/2022 PCP: Moshe Cipro, MD  Patient coming from: Home - lives with husband; NOK:  Bennie Pierini, [phone]   Chief Complaint: RLE edema  HPI: Savannah Hobbs is a 76 y.o. female with medical history significant of afib; NASH cirrhosis with esophageal varcies; HLD; HTN; and MDS present with RLE edema.  She reports prior h/o abdominal cellulitis.  She developed R ankle edema and pain with erythema Monday and went to UC; they diagnosed gout and prescribed colchicine and Bactrim.  It worsened Wednesday and she went back to the doctor yesterday, where her WBC was 45000 (baseline 15-17000).  She was diagnosed with cellulitis and sent to the ER.  No fever.  Significant pain with ambulation.    ER Course:  Carryover, per Dr Hal Hope:   76 year old female with history of atrial fibrillation has been having increasing swelling of the right lower extremity was taking Bactrim as outpatient for the last 48 hours despite which not much improvement presents to the ER with worsening swelling x-ray shows subcutaneous swelling but no air.  Per ER physician no signs of any necrotizing fasciitis.     Review of Systems: As mentioned in the history of present illness. All other systems reviewed and are negative. Past Medical History:  Diagnosis Date   Abdominal wall cellulitis 01/18/2021   Allergy    Arthritis    Asthma    Cirrhosis of liver (HCC)    NASH   Erosive esophagitis 1995   Fatty liver    GERD (gastroesophageal reflux disease)    Hiatal hernia    LARGE   Hx of colonic polyps 05/27/2018   Hx of transfusion of platelets    Hyperlipidemia    Hypertension    Myelodysplasia-Myeloproliferative disease (HCC)    Paroxysmal atrial fibrillation (HCC)    Portal hypertensive gastropathy and coloapthy ([place]) 11/27/2021   Secondary  esophageal varices without bleeding ([place]) 11/27/2021   Thrombocytopenia ([place])    Past Surgical History:  Procedure Laterality Date   ABDOMINAL HYSTERECTOMY     partial   APPENDECTOMY     ATRIAL FIBRILLATION ABLATION N/A 09/26/2020   Procedure: ATRIAL FIBRILLATION ABLATION;  Surgeon: Thompson Grayer, MD;  Location: [facility];  Service: Cardiovascular;  Laterality: N/A;   COLONOSCOPY     ESOPHAGOGASTRODUODENOSCOPY     GYNECOLOGIC CRYOSURGERY     x2   IR US GUIDE BX ASP/DRAIN  01/29/2021   REPLACEMENT TOTAL KNEE Left 2016   TONSILLECTOMY     TOTAL KNEE ARTHROPLASTY Right    TUBAL LIGATION     UPPER GASTROINTESTINAL ENDOSCOPY     Social History:  reports that she has never smoked. She has never used smokeless tobacco. She reports current alcohol use of about 1.0 standard drink of alcohol per week. She reports that she does not use drugs.  No Known Allergies   Family History  Problem Relation Age of Onset   Hypertension Mother    Esophageal cancer Father    Colon polyps Neg Hx    Colon cancer Neg Hx    Rectal cancer Neg Hx    Stomach cancer Neg Hx     Prior to Admission medications   Medication Sig Start Date End Date Taking? Authorizing Provider  acetaminophen (TYLENOL) 500 MG tablet Take 500 mg by mouth every 6 (six) hours as needed for moderate pain.  [provider]  apixaban (ELIQUIS) 5 MG TABS tablet Take 1 tablet (5 mg total) by mouth 2 (two) times daily. 07/18/21 01/14/22  Donato Heinz, MD  Ascorbic Acid (VITAMIN C) 1000 MG tablet Take 1,000 mg by mouth daily.    [provider]  cetirizine (ZYRTEC) 10 MG tablet Take 10 mg by mouth in the morning.    [provider]  diltiazem (CARDIZEM CD) 120 MG 24 hr capsule Take 1 capsule (120 mg total) by mouth daily. 03/25/21 12/23/21  Donato Heinz, MD  docusate sodium (COLACE) 100 MG capsule Take 100 mg by mouth 2 (two) times daily.    [provider]  esomeprazole  (NEXIUM) 20 MG capsule Take 20 mg by mouth daily before breakfast.    [provider]  flecainide (TAMBOCOR) 100 MG tablet Take 2 tablets (200 mg total) by mouth as directed. Take 2 tablets as needed daily for Afib 02/01/20   Allred, Jeneen Rinks, MD  furosemide (LASIX) 40 MG tablet TAKE 1 TABLET BY MOUTH EVERY DAY 12/16/21   Donato Heinz, MD  simvastatin (ZOCOR) 20 MG tablet Take 20 mg by mouth daily.    [provider]  spironolactone (ALDACTONE) 100 MG tablet Take 1 tablet (100 mg total) by mouth daily. 02/03/21 12/23/21  Gaylan Gerold, DO  vitamin B-12 (CYANOCOBALAMIN) 1000 MCG tablet Take 1,000 mcg by mouth daily.    [provider]    Physical Exam: Vitals:   01/23/22 1253 01/23/22 1254 01/23/22 1334 01/23/22 1740  BP: (!) 146/61  (!) 135/56 126/68  Pulse: 93  89 89  Resp: _0 Temp: 97.9 F (36.6 C)  98.9 F (37.2 C) 98.8 F (37.1 C)  TempSrc: Oral  Oral Oral  SpO2: 95% 95% 94% 90%  Weight:      Height:       General:  Appears calm and comfortable and is in NAD Eyes:  EOMI, normal lids, iris ENT:  grossly normal hearing, lips & tongue, mmm; appropriate dentition Neck:  no LAD, masses or thyromegaly Cardiovascular:  RRR, no r/g, 0-2/5 systolic murmur.  Respiratory:   CTA bilaterally with no wheezes/rales/rhonchi.  Normal respiratory effort. Abdomen:  soft, NT, ND Skin:  marked RLE edema and erythema extending to above the knee with blisters along the R ankle         Musculoskeletal:  grossly normal tone BUE/BLE, good ROM, no bony abnormality Psychiatric:  grossly normal mood and affect, speech fluent and appropriate, AOx3 Neurologic:  CN 2-12 grossly intact, moves all extremities in coordinated fashion   Radiological Exams on Admission: Independently reviewed - see discussion in A/P where applicable  CT Angio Chest Pulmonary Embolism (PE) W or WO Contrast  Result Date: 01/23/2022 CLINICAL DATA:  High probability of pulmonary  embolism suspected. EXAM: CT ANGIOGRAPHY CHEST WITH CONTRAST TECHNIQUE: Multidetector CT imaging of the chest was performed using the standard protocol during bolus administration of intravenous contrast. Multiplanar CT image reconstructions and MIPs were obtained to evaluate the vascular anatomy. RADIATION DOSE REDUCTION: This exam was performed according to the departmental dose-optimization program which includes automated exposure control, adjustment of the mA and/or kV according to patient size and/or use of iterative reconstruction technique. CONTRAST:  19m OMNIPAQUE IOHEXOL 350 MG/ML SOLN COMPARISON:  01/22/2020 FINDINGS: Cardiovascular: Heart is enlarged. Aortic valve and mitral valve calcification evident. Coronary artery calcification is evident. Mild atherosclerotic calcification is noted in the wall of the thoracic aorta. There is no filling defect  within the opacified pulmonary arteries to suggest the presence of an acute pulmonary embolus. Mediastinum/Nodes: Similar increased number of mediastinal lymph nodes some of which are borderline to mildly enlarged. 10 mm short axis right paratracheal node on 46/5 was 11 mm previously (remeasured). There is no hilar lymphadenopathy. The esophagus has normal imaging features. There is no axillary lymphadenopathy. Lungs/Pleura: Biapical pleuroparenchymal scarring is similar to prior. Diffuse interlobular septal thickening noted bilaterally scattered areas of mosaic ground-glass attenuation. Dependent atelectasis noted in the lower lobes bilaterally. No suspicious pulmonary nodule or mass. No pleural effusion. Upper Abdomen: Spleen is enlarged but incompletely visualized. Nodular hepatic contours consistent with cirrhosis Musculoskeletal: No worrisome lytic or sclerotic osseous abnormality. Degenerative changes noted right shoulder. Review of the MIP images confirms the above findings. IMPRESSION: 1. No CT evidence for acute pulmonary embolus. 2. Cardiomegaly  with diffuse interlobular septal thickening and scattered areas of mosaic ground-glass attenuation. Imaging features suggest pulmonary edema. 3. Similar increased number of mediastinal lymph nodes some of which are borderline to mildly enlarged. These are likely reactive. 4. Hepatic cirrhosis with splenomegaly. 5.  Aortic Atherosclerosis (ICD10-I70.0). Electronically Signed   By: Misty Stanley M.D.   On: 01/23/2022 12:00   MR TIBIA FIBULA RIGHT WO CONTRAST  Result Date: 01/23/2022 CLINICAL DATA:  Soft tissue infection of the lower leg. EXAM: MRI OF LOWER RIGHT EXTREMITY WITHOUT CONTRAST TECHNIQUE: Multiplanar, multisequence MR imaging of the right lower leg was performed. No intravenous contrast was administered. COMPARISON:  None Available. FINDINGS: Bones/Joint/Cartilage No fracture or dislocation. Normal alignment. No joint effusion. No marrow signal abnormality. Muscles and Tendons Muscles are normal. Soft tissue No fluid collection or hematoma. No soft tissue mass. Severe soft tissue edema in the subcutaneous fat with skin thickening throughout the right lower leg concerning for cellulitis. 7 mm fluid collection along the anterolateral aspect of the ankle. 6 mm fluid collection along the anterior aspect of the distal anterior right lower leg. Focal 10 mm nodular fluid collection in the posterior mid right lower leg. IMPRESSION: 1. Severe soft tissue edema in the subcutaneous fat with skin thickening throughout the right lower leg concerning for cellulitis. 7 mm fluid collection along the anterolateral aspect of the ankle, 6 mm fluid collection along the anterior aspect of the distal anterior right lower leg, and focal 10 mm nodular fluid collection in the posterior mid right lower leg. These are concerning for small abscesses. Electronically Signed   By: Kathreen Devoid M.D.   On: 01/23/2022 09:39   DG Tibia/Fibula Right  Result Date: 01/23/2022 CLINICAL DATA:  Cellulitis EXAM: RIGHT TIBIA AND FIBULA - 2  VIEW COMPARISON:  None Available. FINDINGS: Status post right total knee arthroplasty. Normal alignment. No acute fracture or dislocation. No erosions or abnormal periosteal reaction. Diffuse subcutaneous edema seen within the right lower extremity and marked soft tissue swelling of the visualized right midfoot in keeping with given history of cellulitis. No subcutaneous gas identified. IMPRESSION: 1. Diffuse subcutaneous edema and soft tissue swelling in keeping with given history of cellulitis. No subcutaneous gas. Electronically Signed   By: Fidela Salisbury M.D.   On: 01/23/2022 01:57    EKG: not done   Labs on Admission: I have personally reviewed the available labs and imaging studies at the time of the admission.  Pertinent labs:    BUN 23/Creatinine 1/GFR 59 -> 16/0.6/>60  Lactate 1.6 WBC 42.3 -> 35.4; 22.7-33.2 in 01/2021   Assessment and Plan: Principal Problem:   Cellulitis and abscess of right  lower extremity Active Problems:   Obesity   Paroxysmal atrial fibrillation (HCC)   NASH (nonalcoholic steatohepatitis)   Myelodysplasia-Myeloproliferative disease ([place])   Acute respiratory failure with hypoxia (HCC)    RLE Cellulitis -Patient with progressive erythema and edema to entire R lower leg -She was started on Bactrim 2 days prior but has had progressive symptoms -She does have some stasis-appearing ulcerations -MRI was performed and does not show apparent abscess; Dr. Sammuel Hines reviewed the images and agrees that no intervention is needed at this time -Markedly elevated WBC count - likely MDS + infection but needs to be followed -She was given Vanc and Cefepime based on the cellulitis order set -Admission in this patient is warranted due to:  -failure of outpatient antibiotic therapy (progression/no improvement after a minimum of 48 hours with adequate antibiotic regimen with first-generation cephalosporin or antistaph penicillin or PCN-allergic regimen or resistant organism  regimen) -high-risk comorbid condition is indicated by cirrhosis  Hypoxia -O2 sats as low as 88% -She is not in apparent respiratory distress -She is on Eliquis and so lower suspicion for PE but does have the RLE edema so CTA done and negative for PE -Possible pulmonary edema noted on CTA but she does not have other indication of volume overload; since renal function has normalized with today's labs, will stop IVF for now and resume home Lasix and spironolactone -Will continue [place] O2 and continue to monitor  NASH cirrhosis -h/o esophageal varices -Appears to be stable -Will check CMP in AM -Resume spironolactone  Afib -Rate controlled with diltiazem; also takes prn flecainide -Continue Eliquis  HTN -Continue diltiazem -Spironolactone was held due to mild AKI but this appears to have resolved so will resume  HLD -Continue simvastatin  MDS -She is currently undergoing watchful waiting but prior bone marrow biopsy in 05/2020 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  -Weight loss should be encouraged -Outpatient PCP/bariatric medicine f/u encouraged     Advance Care Planning:   Code Status: Full Code   Consults: Orthopedics by telephone only  DVT Prophylaxis: Eliquis  Family Communication: Son was present throughout evaluation  Severity of Illness: The appropriate patient status for this patient is INPATIENT. Inpatient status is judged to be reasonable and necessary in order to provide the required intensity of service to ensure the patient's safety. The patient's presenting symptoms, physical exam findings, and initial radiographic and laboratory data in the context of their chronic  comorbidities is felt to place them at high risk for further clinical deterioration. Furthermore, it is not anticipated that the patient will be medically stable for discharge from the hospital within 2 midnights of admission.   * I certify that at the point of admission it is my clinical judgment that the patient will require inpatient hospital care spanning beyond 2 midnights from the point of admission due to high intensity of service, high risk for further deterioration and high frequency of surveillance required.*  Author: Karmen Bongo, MD 01/23/2022 6:41 PM  For on call review [email or web address].

## 2022-01-23 NOTE — ED Notes (Signed)
Pt RLE DP/PT pulses dopplar at this time and present.

## 2022-01-23 NOTE — Progress Notes (Signed)
Pharmacy Antibiotic Note  Savannah Hobbs is a 76 y.o. female admitted on 01/22/2022 with R leg cellulitis.  Pharmacy has been consulted for Cefepime and Vancomycin dosing.  Cefepime and Vancomycin previously given in ED on 11/2 AM.   Plan: Continue Cefepime 2g IV q12h Continue Vancomycin '1250mg'$  IV q24h (eAUC ~500)    > Goal AUC 400-550    > Check vancomycin levels at steady state Monitor daily CBC, temp, SCr, and for clinical signs of improvement  F/u cultures and de-escalate antibiotics as able  F/u surgical plan and abx LOT per orthopedic surgery  Height: '5\' 5"'$  (165.1 cm) Weight: 83 kg (183 lb) IBW/kg (Calculated) : 57  Temp (24hrs), Avg:98.6 F (37 C), Min:98 F (36.7 C), Max:99 F (37.2 C)  Recent Labs  Lab 01/22/22 1910  WBC 42.3*  CREATININE 1.00  LATICACIDVEN 1.6    Estimated Creatinine Clearance: 53.3 mL/min (by C-G formula based on SCr of 1 mg/dL).    Allergies  Allergen Reactions   Shellfish Allergy Hives    Antimicrobials this admission: Cefepime 11/2 >>  Vancomycin 11/2 >>   Dose adjustments this admission: N/A  Microbiology results: 11/2 BCx: pending  Thank you for allowing pharmacy to be a part of this patient's care.  Luisa Hart, PharmD, BCPS Clinical Pharmacist 01/23/2022 10:38 AM   Please refer to [facility] for pharmacy phone number

## 2022-01-23 NOTE — ED Notes (Signed)
Patient transported to X-ray 

## 2022-01-23 NOTE — ED Notes (Signed)
Requested secretary to page admit provider due to no response via EPIC at this time

## 2022-01-23 NOTE — ED Notes (Signed)
Pt states that the pain medicine isn't working as well as the initial pain medication. Message sent to Dr. Hal Hope at this time reference same

## 2022-01-23 NOTE — Plan of Care (Signed)
  Problem: Clinical Measurements: Goal: Ability to avoid or minimize complications of infection will improve Outcome: Progressing   Problem: Education: Goal: Knowledge of General Education information will improve Description: Including pain rating scale, medication(s)/side effects and non-pharmacologic comfort measures Outcome: Progressing   Problem: Activity: Goal: Risk for activity intolerance will decrease Outcome: Progressing   Problem: Nutrition: Goal: Adequate nutrition will be maintained Outcome: Progressing   Problem: Coping: Goal: Level of anxiety will decrease Outcome: Progressing   Problem: Elimination: Goal: Will not experience complications related to bowel motility Outcome: Progressing   Problem: Safety: Goal: Ability to remain free from injury will improve Outcome: Progressing

## 2022-01-23 NOTE — ED Notes (Signed)
Patient transported to MRI 

## 2022-01-23 NOTE — ED Notes (Signed)
Pt is requesting pain meds. Dr. Hal Hope sent message via EPIC reference same

## 2022-01-23 NOTE — ED Notes (Signed)
Medication finished

## 2022-01-23 NOTE — ED Provider Notes (Signed)
[facility] EMERGENCY DEPARTMENT Provider Note  CSN: [id number] Arrival date & time: 01/22/22 1833  Chief Complaint(s) Cellulitis  HPI Savannah Hobbs is a 76 y.o. female with a past medical history listed below including A-fib on Eliquis who presents to the emergency department for right leg pain and redness.  Onset was 6 days ago beginning in the right lateral ankle.  Pain and redness since then has gradually worsened.  She was evaluated at urgent care on Monday and treated for possible gout versus cellulitis.  She started taking Bactrim on Tuesday and has not missed any doses.  Presented due to the severity of the redness which now extends from the foot up to the knee.  No fevers or chills.   She reports that she had a negative DVT study recently.  The history is provided by the patient.    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 Authorizing Provider  acetaminophen (TYLENOL) 500 MG tablet Take 500 mg by mouth every 6 (six) hours as needed for moderate pain.    [provider]  apixaban (ELIQUIS) 5 MG TABS tablet Take 1 tablet (5 mg total) by mouth 2 (two) times daily. 07/18/21 01/14/22  Donato Heinz, MD  Ascorbic Acid (VITAMIN C) 1000 MG tablet Take 1,000 mg by mouth daily.    [provider]  cetirizine (ZYRTEC) 10 MG tablet Take 10 mg by mouth in the morning.    [provider]  diltiazem (CARDIZEM CD) 120 MG 24 hr capsule Take 1 capsule (120 mg total) by mouth daily. 03/25/21 12/23/21  Donato Heinz, MD  docusate sodium (COLACE) 100 MG capsule Take 100 mg by mouth 2 (two) times daily.    [provider]  esomeprazole (NEXIUM) 20 MG capsule Take 20 mg by mouth daily before breakfast.    [provider]  flecainide (TAMBOCOR) 100 MG  tablet Take 2 tablets (200 mg total) by mouth as directed. Take 2 tablets as needed daily for Afib 02/01/20   Allred, Jeneen Rinks, MD  furosemide (LASIX) 40 MG tablet TAKE 1 TABLET BY MOUTH EVERY DAY 12/16/21   Donato Heinz, MD  simvastatin (ZOCOR) 20 MG tablet Take 20 mg by mouth daily.    [provider]  spironolactone (ALDACTONE) 100 MG tablet Take 1 tablet (100 mg total) by mouth daily. 02/03/21 12/23/21  Gaylan Gerold, DO  vitamin B-12 (CYANOCOBALAMIN) 1000 MCG tablet Take 1,000 mcg by mouth daily.     [provider]                                                                                                                                    Allergies Shellfish allergy  Review of Systems Review of Systems As noted in HPI  Physical Exam Vital Signs  I have reviewed the triage vital signs BP (!) 127/52   Pulse 91   Temp 98.5 F (36.9 C) (Oral)   Resp 16   Ht '5\' 5"'$  (1.651 m)   Wt 83 kg   SpO2 91%   BMI 30.45 kg/m   Physical Exam Vitals reviewed.  Constitutional:      General: She is not in acute distress.    Appearance: She is well-developed. She is not diaphoretic.  HENT:     Head: Normocephalic and atraumatic.     Right Ear: External ear normal.     Left Ear: External ear normal.     Nose: Nose normal.  Eyes:     General: No scleral icterus.    Conjunctiva/sclera: Conjunctivae normal.  Neck:     Trachea: Phonation normal.  Cardiovascular:     Rate and Rhythm: Normal rate and regular rhythm.  Pulmonary:     Effort: Pulmonary effort is normal. No respiratory distress.     Breath sounds: No stridor.  Abdominal:     General: There is no distension.  Musculoskeletal:        General: Normal range of motion.     Cervical back: Normal range of motion.     Right lower leg: Tenderness present. Edema present.     Left lower leg: Edema present.       Legs:  Neurological:     Mental Status: She is alert and oriented to person, place, and time.  Psychiatric:        Behavior: Behavior normal.     ED Results and Treatments Labs (all labs ordered are listed, but only abnormal results are displayed) Labs Reviewed  CBC WITH DIFFERENTIAL/PLATELET - Abnormal; Notable for the following components:      Result Value   WBC 42.3 (*)    Neutro Abs 34.3 (*)    Basophils Absolute 0.4 (*)    Abs Immature Granulocytes 5.10 (*)    All other components within normal limits  BASIC METABOLIC  PANEL - Abnormal; Notable for the following components:   Sodium 134 (*)     Calcium 8.5 (*)    GFR, Estimated 59 (*)    All other components within normal limits  LACTIC ACID, PLASMA  LACTIC ACID, PLASMA  PATHOLOGIST SMEAR REVIEW                                                                                                                         EKG  EKG Interpretation  Date/Time:    Ventricular Rate:    PR Interval:    QRS Duration:   QT Interval:    QTC Calculation:   R Axis:     Text Interpretation:         Radiology DG Tibia/Fibula Right  Result Date: 01/23/2022 CLINICAL DATA:  Cellulitis EXAM: RIGHT TIBIA AND FIBULA - 2 VIEW COMPARISON:  None Available. FINDINGS: Status post right total knee arthroplasty. Normal alignment. No acute fracture or dislocation. No erosions or abnormal periosteal reaction. Diffuse subcutaneous edema seen within the right lower extremity and marked soft tissue swelling of the visualized right midfoot in keeping with given history of cellulitis. No subcutaneous gas identified. IMPRESSION: 1. Diffuse subcutaneous edema and soft tissue swelling in keeping with given history of cellulitis. No subcutaneous gas. Electronically Signed   By: Fidela Salisbury M.D.   On: 01/23/2022 01:57    Medications Ordered in ED Medications  morphine (PF) 4 MG/ML injection 4 mg (4 mg Intravenous Given 01/23/22 0119)  vancomycin (VANCOCIN) IVPB 1000 mg/200 mL premix (0 mg Intravenous Stopped 01/23/22 0228)  0.9 %  sodium chloride infusion (0 mLs Intravenous Stopped 01/23/22 0332)  fentaNYL (SUBLIMAZE) injection 25 mcg (25 mcg Intravenous Given 01/23/22 0353)  morphine (PF) 2 MG/ML injection 2 mg (2 mg Intravenous Given 01/23/22 0517)                                                                                                                                     Procedures Procedures  (including critical care time)  Medical Decision Making / ED Course   Medical Decision Making Amount and/or Complexity of Data Reviewed Labs: ordered.  Decision-making details documented in ED Course. Radiology: ordered and independent interpretation performed. Decision-making details documented in ED Course.  Risk Prescription drug management. Parenteral controlled substances. Decision regarding hospitalization.     Complexity of Problem:  Patient's presenting problem/concern, DDX, and  MDM listed below: Right leg pain and redness Consistent with cellulitis, purulent. Lower concern for necrotizing infection Doubt septic joint.  Hospitalization Considered:  yes    Complexity of Data:   Laboratory Tests ordered listed below with my independent interpretation: CBC leukocytosis.  No anemia. Metabolic panel with mild hyponatremia.  No other electrolyte derangements or renal sufficiency. Lactic acid normal.    Imaging Studies ordered listed below with my independent interpretation: X-ray of the right leg negative for any subcutaneous gas concerning for necrotizing infection.     ED Course:    Assessment, Add'l Intervention, and Reassessment: Right leg cellulitis, severe acute, We will need IV antibiotics. Vancomycin started Patient given IV fluids and IV pain medicine Admitted to medicine for further management     Final Clinical Impression(s) / ED Diagnoses Final diagnoses:  Cellulitis of right leg           This chart was dictated using voice recognition software.  Despite best efforts to proofread,  errors can occur which can change the documentation meaning.    Fatima Blank, MD 01/23/22 [phone]

## 2022-01-23 NOTE — ED Notes (Signed)
Patient transported to CT 

## 2022-01-23 NOTE — ED Notes (Signed)
Waiting for pharmacy to verify home medications.

## 2022-01-23 NOTE — ED Notes (Signed)
Pt transported via stretcher with belongings and son to 2W

## 2022-01-23 NOTE — ED Notes (Signed)
Patient has been transported to MRI at this time.

## 2022-01-23 NOTE — ED Notes (Signed)
Patient returned from MRI.

## 2022-01-23 NOTE — ED Notes (Signed)
Received call from MRI at this time

## 2022-01-23 NOTE — Progress Notes (Signed)
ANTICOAGULATION CONSULT NOTE - Initial Consult  Pharmacy Consult for apixaban Indication: atrial fibrillation  No Known Allergies  Patient Measurements: Height: '5\' 5"'$  (165.1 cm) Weight: 83 kg (183 lb) IBW/kg (Calculated) : 57 Heparin Dosing Weight:   Vital Signs: Temp: 98.9 F (37.2 C) (11/03 1334) Temp Source: Oral (11/03 1334) BP: 135/56 (11/03 1334) Pulse Rate: 89 (11/03 1334)  Labs: Recent Labs    01/22/22 1910 01/23/22 1053  HGB 13.7 12.0  HCT 40.3 35.8*  PLT 222 212  CREATININE 1.00 0.60    Estimated Creatinine Clearance: 66.6 mL/min (by C-G formula based on SCr of 0.6 mg/dL).   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   01/22/2022   apixaban (ELIQUIS) 5 MG TABS tablet Take 1 tablet (5 mg total) by mouth 2 (two) times daily. 180 tablet 1 01/22/2022 at 1100   Ascorbic Acid (VITAMIN C) 1000 MG tablet Take 1,000 mg by mouth daily.   01/22/2022   cetirizine (ZYRTEC) 10 MG tablet Take 10 mg by mouth in the morning.   Past Week   colchicine 0.6 MG tablet Take 0.6 mg by mouth daily.   01/22/2022   diltiazem (CARDIZEM CD) 120 MG 24 hr capsule Take 1 capsule (120 mg total) by mouth daily. 90  capsule 3 01/22/2022   docusate sodium (COLACE) 100 MG capsule Take 100 mg by mouth 2 (two) times daily.   Past Week   esomeprazole (NEXIUM) 20 MG capsule Take 20 mg by mouth daily before breakfast.   01/22/2022   flecainide (TAMBOCOR) 100 MG tablet Take 2 tablets (200 mg total) by mouth as directed. Take 2 tablets as needed daily for Afib 10 tablet 1 01/22/2022   furosemide (LASIX) 40 MG tablet TAKE 1 TABLET BY MOUTH EVERY DAY (Patient taking differently: Take 40 mg by mouth daily.) 90 tablet 1 01/22/2022   simvastatin (ZOCOR) 20 MG tablet Take 20 mg by mouth daily.   01/22/2022   spironolactone (ALDACTONE) 100 MG tablet Take 1 tablet (100 mg total) by mouth daily. 30 tablet 0 01/21/2022   sulfamethoxazole-trimethoprim (BACTRIM DS) 800-160 MG tablet Take 1 tablet by mouth every 12 (twelve) hours.   01/22/2022   vitamin B-12 (CYANOCOBALAMIN) 1000 MCG tablet Take 1,000 mcg by mouth daily.   01/22/2022   cephALEXin (KEFLEX) 500 MG capsule Take 500 mg by mouth 3 (three) times daily. (Patient not taking: Reported on 01/23/2022)   Completed Course   Scheduled:   apixaban  5 mg Oral BID   [START ON 01/24/2022] atorvastatin  10 mg Oral Daily   diltiazem  120 mg Oral Daily   docusate sodium  100 mg Oral BID   loratadine  10 mg Oral Daily   pantoprazole  40 mg Oral Daily   sodium chloride flush  3 mL Intravenous Q12H    Assessment: Pt was admitted for RLE edema. She has been on apixaban for here AF. Apixaban has been ordered to resumed. Her last dose was yesterday.   Age<80 Wt>60kg Scr<1.5  Goal of Therapy:  Monitor platelets by anticoagulation protocol: Yes   Plan:  Apixaban '5mg'$  PO BID Rx will follow peripherally  Onnie Boer, PharmD, BCIDP, AAHIVP, CPP Infectious Disease Pharmacist 01/23/2022 4:47 PM

## 2022-01-24 DIAGNOSIS — I10 Essential (primary) hypertension: Secondary | ICD-10-CM | POA: Insufficient documentation

## 2022-01-24 DIAGNOSIS — K7460 Unspecified cirrhosis of liver: Secondary | ICD-10-CM | POA: Insufficient documentation

## 2022-01-24 DIAGNOSIS — L03115 Cellulitis of right lower limb: Secondary | ICD-10-CM | POA: Diagnosis not present

## 2022-01-24 DIAGNOSIS — L02415 Cutaneous abscess of right lower limb: Secondary | ICD-10-CM | POA: Diagnosis not present

## 2022-01-24 DIAGNOSIS — E871 Hypo-osmolality and hyponatremia: Secondary | ICD-10-CM | POA: Insufficient documentation

## 2022-01-24 DIAGNOSIS — E785 Hyperlipidemia, unspecified: Secondary | ICD-10-CM | POA: Insufficient documentation

## 2022-01-24 LAB — COMPREHENSIVE METABOLIC PANEL
ALT: 15 U/L (ref 0–44)
AST: 30 U/L (ref 15–41)
Albumin: 2.6 g/dL — ABNORMAL LOW (ref 3.5–5.0)
Alkaline Phosphatase: 63 U/L (ref 38–126)
Anion gap: 9 (ref 5–15)
BUN: 11 mg/dL (ref 8–23)
CO2: 22 mmol/L (ref 22–32)
Calcium: 8.3 mg/dL — ABNORMAL LOW (ref 8.9–10.3)
Chloride: 102 mmol/L (ref 98–111)
Creatinine, Ser: 0.62 mg/dL (ref 0.44–1.00)
GFR, Estimated: >60 mL/min (ref >60)
Glucose, Bld: 87 mg/dL (ref 70–99)
Potassium: 4.3 mmol/L (ref 3.5–5.1)
Sodium: 133 mmol/L — ABNORMAL LOW (ref 135–145)
Total Bilirubin: 1.8 mg/dL — ABNORMAL HIGH (ref 0.3–1.2)
Total Protein: 6.4 g/dL — ABNORMAL LOW (ref 6.5–8.1)

## 2022-01-24 LAB — CBC
HCT: 35 % — ABNORMAL LOW (ref 36.0–46.0)
Hemoglobin: 11.7 g/dL — ABNORMAL LOW (ref 12.0–15.0)
MCH: 31.1 pg (ref 26.0–34.0)
MCHC: 33.4 g/dL (ref 30.0–36.0)
MCV: 93.1 fL (ref 80.0–100.0)
Platelets: 224 10*3/uL (ref 150–400)
RBC: 3.76 MIL/uL — ABNORMAL LOW (ref 3.87–5.11)
RDW: 15 % (ref 11.5–15.5)
WBC: 30.5 10*3/uL — ABNORMAL HIGH (ref 4.0–10.5)
nRBC: 0 % (ref 0.0–0.2)

## 2022-01-24 NOTE — Assessment & Plan Note (Signed)
Patient has a history of myelodysplastic syndrome/MPN overlap with baseline WBC ~20-25K, she follows with an outside health system by hematology. Slightly up WBC due to infection, no clinical concern.   - Follow up with hematology

## 2022-01-24 NOTE — Assessment & Plan Note (Signed)
Blood pressure controlled - Continue diltiazem, furosemide, spironolactone

## 2022-01-24 NOTE — Assessment & Plan Note (Signed)
Continue atorvastatin

## 2022-01-24 NOTE — Assessment & Plan Note (Signed)
Rate controlled - Continue diltiazem - Continue apixaban

## 2022-01-24 NOTE — Assessment & Plan Note (Addendum)
This appears compensated at present - Continue spironolactone and furosemide

## 2022-01-24 NOTE — Assessment & Plan Note (Signed)
Presenting with redness, pain, and swelling of the lower extremity.  MRI showed extensive edema, and some very very small fluid pockets, no gas  Stable to improving, still too painful to walk on more than a few steps, WBC still elevated.  Ortho evaluated, thankfully no role for surgery. - Continue vancomycin and cefepime - Follow-up blood cultures

## 2022-01-24 NOTE — Assessment & Plan Note (Signed)
BMI 30.4

## 2022-01-24 NOTE — Hospital Course (Addendum)
Savannah Hobbs is a 76 y.o. 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  In the ER, MRI showed edema and small fluid pockets.  Orthopedics were consulted.

## 2022-01-24 NOTE — Progress Notes (Signed)
  Progress Note   Patient: Savannah Hobbs FHQ:[id number] DOB: 10-09-1945 DOA: 01/22/2022     1 DOS: the patient was seen and examined on 01/24/2022 at 11:31 AM      Brief hospital course: Savannah Hobbs is a 76 y.o. 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  In the ER, MRI showed edema and small fluid pockets.  Orthopedics were consulted.     Assessment and Plan: * Cellulitis and abscess of right lower extremity Presenting with redness, pain, and swelling of the lower extremity.  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  Here her white blood counts are elevated, but the white blood cell count is improving  Secondary hypercoagulable state ([place]) Due to atrial fibrillation  Paroxysmal atrial fibrillation (HCC) Rate controlled - Continue diltiazem - Continue apixaban  Obesity BMI 30.4          Subjective: Patient is doing well, she is having some shooting pains in the leg periodically but overall is improving a lot, swelling is better, the pain is better, she had no fever, no vomiting, no swelling of her belly, no respiratory distress, no palpitations or chest  pain     Physical Exam: BP (!) 122/48 (BP Location: Right Arm)   Pulse 79   Temp 98.5 F (36.9 C) (Oral)   Resp 18   Ht '5\' 5"'$  (1.651 m)   Wt 83 kg   SpO2 94%   BMI 30.45 kg/m   Overweight adult female, lying in bed, no acute distress Heart rate normal, no murmurs, no peripheral edema Respiratory rate normal, lungs clear without rales or wheezes Abdomen soft no tenderness palpation or guarding The right leg is elevated, there is a few small blisters which have not ruptured, and generalized redness, but swelling is resolved, the redness is clearly receding from the line drawn yesterday Attention normal, affect appropriate, judgment and insight appear normal    Data Reviewed: Discussed with orthopedic surgery, Dr. Sharol Given CT angiogram of the chest shows no pulmonary embolism, but some mild interstitial thickening, consistent with cirrhosis MRI of the legs shows edema, tiny fluid collections CBC shows elevated white blood cell count but improving Creatinine and potassium normal on the basic metabolic panel, sodium somewhat low    Family Communication: son by phone    Disposition: Status is: Inpatient         Author: Edwin Dada, MD 01/24/2022 1:55 PM  For on call review [email or web address].

## 2022-01-24 NOTE — Assessment & Plan Note (Signed)
Mild, due to cirrhosis - Continue diuretics

## 2022-01-24 NOTE — Consult Note (Signed)
ORTHOPAEDIC CONSULTATION  REQUESTING PHYSICIAN: Edwin Dada, *  Chief Complaint: Infection right leg.  HPI: Savannah Hobbs is a 76 y.o. female who presents with acute cellulitis swelling and infection right leg.  Patient denies any trauma.  Past Medical History:  Diagnosis Date   Abdominal wall cellulitis 01/18/2021   Allergy    Arthritis    Asthma    Cirrhosis of liver (HCC)    NASH   Erosive esophagitis 1995   Fatty liver    GERD (gastroesophageal reflux disease)    Hiatal hernia    LARGE   Hx of colonic polyps 05/27/2018   Hx of transfusion of platelets    Hyperlipidemia    Hypertension    Myelodysplasia-Myeloproliferative disease (HCC)    Paroxysmal atrial fibrillation (HCC)    Portal hypertensive gastropathy and coloapthy ([place]) 11/27/2021   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[place]) 11/27/2021   Thrombocytopenia ([place])    Past Surgical History:  Procedure Laterality Date   ABDOMINAL HYSTERECTOMY     partial   APPENDECTOMY     ATRIAL FIBRILLATION ABLATION N/A 09/26/2020   Procedure: ATRIAL FIBRILLATION ABLATION;  Surgeon: Thompson Grayer, MD;  Location: [facility];  Service: Cardiovascular;  Laterality: N/A;   COLONOSCOPY     ESOPHAGOGASTRODUODENOSCOPY     GYNECOLOGIC CRYOSURGERY     x2   IR US GUIDE BX ASP/DRAIN  01/29/2021   REPLACEMENT TOTAL KNEE Left 2016   TONSILLECTOMY     TOTAL KNEE ARTHROPLASTY Right    TUBAL LIGATION     UPPER GASTROINTESTINAL ENDOSCOPY     Social History   Socioeconomic History   Marital status: Married    Spouse name: Not on file   Number of children: 3   Years of education: Nursing degree   Highest education level: Not on file  Occupational History   Occupation: retired Therapist, sports  Tobacco Use   Smoking status: Never   Smokeless tobacco: Never  Substance and Sexual Activity   Alcohol use: Yes    Alcohol/week: 1.0 standard drink of alcohol    Types: 1 Glasses of wine per week   Drug use: No    Sexual activity: Not on file  Other Topics Concern   Not on file  Social History Narrative   Married, 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   3 sons, Nov 08, 2015 1 son died in his sleep unclear cause   Right handed    Some caffeine    Social Determinants of Health   Financial Resource Strain: Not on file  Food Insecurity: Not on file  Transportation Needs: Not on file  Physical Activity: Not on file  Stress: Not on file  Social Connections: Not on file   Family History  Problem Relation Age of Onset   Hypertension Mother    Esophageal cancer Father    Colon polyps Neg Hx    Colon cancer Neg Hx    Rectal cancer Neg Hx    Stomach cancer Neg Hx    - negative except otherwise stated in the family history section No Known Allergies Prior to Admission medications   Medication Sig Start Date End Date Taking? Authorizing Provider  acetaminophen (TYLENOL) 500 MG tablet Take 500 mg by mouth every 6 (six) hours as needed for moderate pain.   Yes [provider]  apixaban (ELIQUIS) 5 MG TABS tablet Take 1 tablet (5 mg total) by mouth 2 (two) times daily. 07/18/21 01/23/22 Yes Oswaldo Milian  L, MD  Ascorbic Acid (VITAMIN C) 1000 MG tablet Take 1,000 mg by mouth daily.   Yes [provider]  cetirizine (ZYRTEC) 10 MG tablet Take 10 mg by mouth in the morning.   Yes [provider]  colchicine 0.6 MG tablet Take 0.6 mg by mouth daily. 01/20/22  Yes [provider]  diltiazem (CARDIZEM CD) 120 MG 24 hr capsule Take 1 capsule (120 mg total) by mouth daily. 03/25/21 01/23/22 Yes Donato Heinz, MD  docusate sodium (COLACE) 100 MG capsule Take 100 mg by mouth 2 (two) times daily.   Yes [provider]  esomeprazole (NEXIUM) 20 MG capsule Take 20 mg by mouth daily before breakfast.   Yes [provider]  flecainide (TAMBOCOR) 100 MG tablet Take 2 tablets (200 mg total) by mouth as directed. Take  2 tablets as needed daily for Afib 02/01/20  Yes Allred, Jeneen Rinks, MD  furosemide (LASIX) 40 MG tablet TAKE 1 TABLET BY MOUTH EVERY DAY Patient taking differently: Take 40 mg by mouth daily. 12/16/21  Yes Donato Heinz, MD  simvastatin (ZOCOR) 20 MG tablet Take 20 mg by mouth daily.   Yes [provider]  spironolactone (ALDACTONE) 100 MG tablet Take 1 tablet (100 mg total) by mouth daily. 02/03/21 01/23/22 Yes Gaylan Gerold, DO  sulfamethoxazole-trimethoprim (BACTRIM DS) 800-160 MG tablet Take 1 tablet by mouth every 12 (twelve) hours. 01/20/22  Yes [provider]  vitamin B-12 (CYANOCOBALAMIN) 1000 MCG tablet Take 1,000 mcg by mouth daily.   Yes [provider]   CT Angio Chest Pulmonary Embolism (PE) W or WO Contrast  Result Date: 01/23/2022 CLINICAL DATA:  High probability of pulmonary embolism suspected. EXAM: CT ANGIOGRAPHY CHEST WITH CONTRAST TECHNIQUE: Multidetector CT imaging of the chest was performed using the standard protocol during bolus administration of intravenous contrast. Multiplanar CT image reconstructions and MIPs were obtained to evaluate the vascular anatomy. RADIATION DOSE REDUCTION: This exam was performed according to the departmental dose-optimization program which includes automated exposure control, adjustment of the mA and/or kV according to patient size and/or use of iterative reconstruction technique. CONTRAST:  65m OMNIPAQUE IOHEXOL 350 MG/ML SOLN COMPARISON:  01/22/2020 FINDINGS: Cardiovascular: Heart is enlarged. Aortic valve and mitral valve calcification evident. Coronary artery calcification is evident. Mild atherosclerotic calcification is noted in the wall of the thoracic aorta. There is no filling defect within the opacified pulmonary arteries to suggest the presence of an acute pulmonary embolus. Mediastinum/Nodes: Similar increased number of mediastinal lymph nodes some of which are borderline to mildly enlarged. 10 mm short axis  right paratracheal node on 46/5 was 11 mm previously (remeasured). There is no hilar lymphadenopathy. The esophagus has normal imaging features. There is no axillary lymphadenopathy. Lungs/Pleura: Biapical pleuroparenchymal scarring is similar to prior. Diffuse interlobular septal thickening noted bilaterally scattered areas of mosaic ground-glass attenuation. Dependent atelectasis noted in the lower lobes bilaterally. No suspicious pulmonary nodule or mass. No pleural effusion. Upper Abdomen: Spleen is enlarged but incompletely visualized. Nodular hepatic contours consistent with cirrhosis Musculoskeletal: No worrisome lytic or sclerotic osseous abnormality. Degenerative changes noted right shoulder. Review of the MIP images confirms the above findings. IMPRESSION: 1. No CT evidence for acute pulmonary embolus. 2. Cardiomegaly with diffuse interlobular septal thickening and scattered areas of mosaic ground-glass attenuation. Imaging features suggest pulmonary edema. 3. Similar increased number of mediastinal lymph nodes some of which are borderline to mildly enlarged. These are likely reactive. 4. Hepatic cirrhosis with splenomegaly. 5.  Aortic Atherosclerosis (ICD10-I70.0).  Electronically Signed   By: Misty Stanley M.D.   On: 01/23/2022 12:00   MR TIBIA FIBULA RIGHT WO CONTRAST  Result Date: 01/23/2022 CLINICAL DATA:  Soft tissue infection of the lower leg. EXAM: MRI OF LOWER RIGHT EXTREMITY WITHOUT CONTRAST TECHNIQUE: Multiplanar, multisequence MR imaging of the right lower leg was performed. No intravenous contrast was administered. COMPARISON:  None Available. FINDINGS: Bones/Joint/Cartilage No fracture or dislocation. Normal alignment. No joint effusion. No marrow signal abnormality. Muscles and Tendons Muscles are normal. Soft tissue No fluid collection or hematoma. No soft tissue mass. Severe soft tissue edema in the subcutaneous fat with skin thickening throughout the right lower leg concerning for  cellulitis. 7 mm fluid collection along the anterolateral aspect of the ankle. 6 mm fluid collection along the anterior aspect of the distal anterior right lower leg. Focal 10 mm nodular fluid collection in the posterior mid right lower leg. IMPRESSION: 1. Severe soft tissue edema in the subcutaneous fat with skin thickening throughout the right lower leg concerning for cellulitis. 7 mm fluid collection along the anterolateral aspect of the ankle, 6 mm fluid collection along the anterior aspect of the distal anterior right lower leg, and focal 10 mm nodular fluid collection in the posterior mid right lower leg. These are concerning for small abscesses. Electronically Signed   By: Kathreen Devoid M.D.   On: 01/23/2022 09:39   DG Tibia/Fibula Right  Result Date: 01/23/2022 CLINICAL DATA:  Cellulitis EXAM: RIGHT TIBIA AND FIBULA - 2 VIEW COMPARISON:  None Available. FINDINGS: Status post right total knee arthroplasty. Normal alignment. No acute fracture or dislocation. No erosions or abnormal periosteal reaction. Diffuse subcutaneous edema seen within the right lower extremity and marked soft tissue swelling of the visualized right midfoot in keeping with given history of cellulitis. No subcutaneous gas identified. IMPRESSION: 1. Diffuse subcutaneous edema and soft tissue swelling in keeping with given history of cellulitis. No subcutaneous gas. Electronically Signed   By: Fidela Salisbury M.D.   On: 01/23/2022 01:57   - pertinent xrays, CT, MRI studies were reviewed and independently interpreted  Positive ROS: All other systems have been reviewed and were otherwise negative with the exception of those mentioned in the HPI and as above.  Physical Exam: General: Alert, no acute distress Psychiatric: Patient is competent for consent with normal mood and affect Lymphatic: No axillary or cervical lymphadenopathy Cardiovascular: No pedal edema Respiratory: No cyanosis, no use of accessory musculature GI: No  organomegaly, abdomen is soft and non-tender    Images:  '@ENCIMAGES'$ @  Labs:  Lab Results  Component Value Date   ESRSEDRATE 55 (H) 01/31/2021   ESRSEDRATE 57 (H) 01/28/2021   CRP 2.3 (H) 01/31/2021   CRP 4.5 (H) 01/28/2021   REPTSTATUS PENDING 01/23/2022   GRAMSTAIN  01/29/2021    MODERATE WBC PRESENT, PREDOMINANTLY PMN NO ORGANISMS SEEN    CULT  01/23/2022    NO GROWTH < 24 HOURS Performed at [facility] [address], [place], [place] [address]    LABORGA ESCHERICHIA COLI 01/29/2021    Lab Results  Component Value Date   ALBUMIN 2.6 (L) 01/24/2022   ALBUMIN 3.8 03/25/2021   ALBUMIN 2.4 (L) 01/25/2021        Latest Ref Rng & Units 01/24/2022    3:36 AM 01/23/2022   10:53 AM 01/22/2022    7:10 PM  CBC EXTENDED  WBC 4.0 - 10.5 K/uL 30.5  35.4  42.3   RBC 3.87 - 5.11 MIL/uL 3.76  3.85  4.33   Hemoglobin 12.0 - 15.0 g/dL 11.7  12.0  13.7   HCT 36.0 - 46.0 % 35.0  35.8  40.3   Platelets 150 - 400 K/uL 224  212  222   NEUT# 1.7 - 7.7 K/uL  32.6  34.3   Lymph# 0.7 - 4.0 K/uL  1.4  1.7     Neurologic: Patient does not have protective sensation bilateral lower extremities.   MUSCULOSKELETAL:   Skin: Examination the swelling and cellulitis has resolved significantly from her initial presentation.  There is good wrinkling of the skin.  There is no crepitation with palpation of the thigh or calf.  Patient has good active range of motion of her ankle and toes without pain with active or passive range of motion.  Patient's white cell count has decreased from 35-30.  Hemoglobin stable at 11.7.  Albumin 2.6.  Radiograph shows no air in the soft tissue.  Review of the MRI scan shows mostly fluid in the subcutaneous tissue there is no inflammation of the fascia.  No large abscess.  Assessment: Assessment: Resolving cellulitis right leg.  Plan: I will follow-up again tomorrow.  With the significant improvement in her leg I anticipate this should resolve without  surgical debridement.  Thank you for the consult and the opportunity to see Savannah Hobbs, [place] [phone] 1:44 PM

## 2022-01-24 NOTE — Assessment & Plan Note (Signed)
Due to atrial fibrillation

## 2022-01-25 DIAGNOSIS — E782 Mixed hyperlipidemia: Secondary | ICD-10-CM | POA: Diagnosis not present

## 2022-01-25 DIAGNOSIS — L02415 Cutaneous abscess of right lower limb: Secondary | ICD-10-CM | POA: Diagnosis not present

## 2022-01-25 DIAGNOSIS — I10 Essential (primary) hypertension: Secondary | ICD-10-CM | POA: Diagnosis not present

## 2022-01-25 DIAGNOSIS — K7460 Unspecified cirrhosis of liver: Secondary | ICD-10-CM

## 2022-01-25 DIAGNOSIS — L03115 Cellulitis of right lower limb: Secondary | ICD-10-CM | POA: Diagnosis not present

## 2022-01-25 DIAGNOSIS — E871 Hypo-osmolality and hyponatremia: Secondary | ICD-10-CM

## 2022-01-25 LAB — CBC
HCT: 34.5 % — ABNORMAL LOW (ref 36.0–46.0)
Hemoglobin: 11.7 g/dL — ABNORMAL LOW (ref 12.0–15.0)
MCH: 31.2 pg (ref 26.0–34.0)
MCHC: 33.9 g/dL (ref 30.0–36.0)
MCV: 92 fL (ref 80.0–100.0)
Platelets: 211 10*3/uL (ref 150–400)
RBC: 3.75 MIL/uL — ABNORMAL LOW (ref 3.87–5.11)
RDW: 14.9 % (ref 11.5–15.5)
WBC: 30.7 10*3/uL — ABNORMAL HIGH (ref 4.0–10.5)
nRBC: 0 % (ref 0.0–0.2)

## 2022-01-25 LAB — BASIC METABOLIC PANEL
Anion gap: 9 (ref 5–15)
BUN: 10 mg/dL (ref 8–23)
CO2: 24 mmol/L (ref 22–32)
Calcium: 8.3 mg/dL — ABNORMAL LOW (ref 8.9–10.3)
Chloride: 99 mmol/L (ref 98–111)
Creatinine, Ser: 0.49 mg/dL (ref 0.44–1.00)
GFR, Estimated: >60 mL/min (ref >60)
Glucose, Bld: 97 mg/dL (ref 70–99)
Potassium: 4 mmol/L (ref 3.5–5.1)
Sodium: 132 mmol/L — ABNORMAL LOW (ref 135–145)

## 2022-01-25 MED ORDER — SODIUM CHLORIDE 0.9 % IV SOLN
2.0000 g | Freq: Three times a day (TID) | INTRAVENOUS | Status: DC
  Administered 2022-01-25 – 2022-01-26 (×3): 2 g via INTRAVENOUS
  Filled 2022-01-25 (×3): qty 12.5

## 2022-01-25 MED ORDER — VANCOMYCIN HCL IN DEXTROSE 1-5 GM/200ML-% IV SOLN
1000.0000 mg | INTRAVENOUS | Status: DC
  Administered 2022-01-25: 1000 mg via INTRAVENOUS
  Filled 2022-01-25: qty 200

## 2022-01-25 NOTE — Evaluation (Signed)
Physical Therapy Evaluation Patient Details Name: Savannah Hobbs MRN: [id number] DOB: [birth year] Today's Date: 01/25/2022  History of Present Illness  76 y.o. 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 Admitted 03/24/21 for Cellulitis and abscess of right lower extremity.  Clinical Impression  Pt admitted with above diagnosis. Tolerated gait training for short distance up to 30 feet, very slow, minimal tolerance WB through RLE. Reviewed exercises to preserve strength and ROM while less mobile. Reports Ind prior to admission but has RW and wheelchair at home. Husband available to assist 24/7 as needed. May benefit from HHPT if still having difficulty ambulating due to pain at time of d/c. Pt currently with functional limitations due to the deficits listed below (see PT Problem List). Pt will benefit from skilled PT to increase their independence and safety with mobility to allow discharge to the venue listed below.          Recommendations for follow up therapy are one component of a multi-disciplinary discharge planning process, led by the attending physician.  Recommendations may be updated based on patient status, additional functional criteria and insurance authorization.  Follow Up Recommendations Home health PT ((May progress to OPPT if pain improves and she can tolerate more weight through RLE to ambulate.))      Assistance Recommended at Discharge Intermittent Supervision/Assistance  Patient can return home with the following  A little help with walking and/or transfers;A little help with bathing/dressing/bathroom;Help with stairs or ramp for entrance;Assist for transportation;Assistance with cooking/housework    Equipment Recommendations None recommended by PT  Recommendations for Other Services       Functional Status Assessment Patient has had a recent  decline in their functional status and demonstrates the ability to make significant improvements in function in a reasonable and predictable amount of time.     Precautions / Restrictions Precautions Precautions: None Restrictions Weight Bearing Restrictions: No      Mobility  Bed Mobility Overal bed mobility: Modified Independent             General bed mobility comments: extra time`    Transfers Overall transfer level: Needs assistance Equipment used: Rolling walker (2 wheels) Transfers: Sit to/from Stand Sit to Stand: Min guard, From elevated surface           General transfer comment: min guard for safety, limited tolerance to WB through RLE to push up. Cues for hand placement    Ambulation/Gait Ambulation/Gait assistance: Min guard Gait Distance (Feet): 30 Feet Assistive device: Rolling walker (2 wheels) Gait Pattern/deviations: Step-to pattern, Decreased stance time - right, Decreased stride length, Decreased weight shift to right, Antalgic Gait velocity: slow Gait velocity interpretation: <1.31 ft/sec, indicative of household ambulator   General Gait Details: Educated on safe AD use with RW and techniques to gently load RLE. Minimal tolerance with RLE on floor, pain mostly in anterior ankle and a/p RLE below the knee. Very slow but with good RW control and no evidence of buckling.  Stairs            Wheelchair Mobility    Modified Rankin (Stroke Patients Only)       Balance Overall balance assessment: Needs assistance Sitting-balance support: No upper extremity supported, Feet supported Sitting balance-Leahy Scale: Good     Standing balance support: Single extremity supported, Reliant on assistive device for balance Standing balance-Leahy Scale: Poor Standing balance comment: Requires  support to unload RLE                             Pertinent Vitals/Pain Pain Assessment Pain Assessment: 0-10 Pain Score: 5  Pain Location:  RLE Pain Descriptors / Indicators: Aching Pain Intervention(s): Limited activity within patient's tolerance, Monitored during session, Repositioned    Home Living Family/patient expects to be discharged to:: Private residence Living Arrangements: Spouse/significant other Available Help at Discharge: Family;Available 24 hours/day Type of Home: House Home Access: Stairs to enter Entrance Stairs-Rails: None Entrance Stairs-Number of Steps: 2   Home Layout: Able to live on main level with bedroom/bathroom;Laundry or work area in [place]: [place] (2 wheels);Toilet riser;BSC/3in1;Wheelchair - manual      Prior Function Prior Level of Function : Independent/Modified Independent             Mobility Comments: ind, does not work ADLs Comments: ind     Engineer, manufacturing Dominance   Dominant Hand: Right    Extremity/Trunk Assessment   Upper Extremity Assessment Upper Extremity Assessment: Defer to OT evaluation    Lower Extremity Assessment Lower Extremity Assessment: RLE deficits/detail RLE Deficits / Details: Slightly edematous, very painful to ligth touch RLE: Unable to fully assess due to pain       Communication   Communication: No difficulties  Cognition Arousal/Alertness: Awake/alert Behavior During Therapy: WFL for tasks assessed/performed Overall Cognitive Status: Within Functional Limits for tasks assessed                                          General Comments      Exercises General Exercises - Lower Extremity Ankle Circles/Pumps: AROM, Right, 10 reps, Supine Quad Sets: Strengthening, Both, Supine, 5 reps Gluteal Sets: Strengthening, Both, 5 reps, Supine Long Arc Quad: Strengthening, Right, 10 reps, Seated   Assessment/Plan    PT Assessment Patient needs continued PT services  PT Problem List Decreased strength;Decreased range of motion;Decreased balance;Decreased activity tolerance;Decreased  mobility;Decreased knowledge of use of DME;Pain;Decreased skin integrity       PT Treatment Interventions DME instruction;Gait training;Functional mobility training;Stair training;Therapeutic activities;Therapeutic exercise;Balance training;Neuromuscular re-education;Patient/family education    PT Goals (Current goals can be found in the Care Plan section)  Acute Rehab PT Goals Patient Stated Goal: Reduce pain PT Goal Formulation: With patient Time For Goal Achievement: 02/01/22 Potential to Achieve Goals: Good    Frequency Min 3X/week     Co-evaluation               AM-PAC PT "6 Clicks" Mobility  Outcome Measure Help needed turning from your back to your side while in a flat bed without using bedrails?: None Help needed moving from lying on your back to sitting on the side of a flat bed without using bedrails?: None Help needed moving to and from a bed to a chair (including a wheelchair)?: A Little Help needed standing up from a chair using your arms (e.g., wheelchair or bedside chair)?: A Little Help needed to walk in hospital room?: A Little Help needed climbing 3-5 steps with a railing? : A Little 6 Click Score: 20    End of Session Equipment Utilized During Treatment: Gait belt Activity Tolerance: Patient limited by pain Patient left: in bed;with call bell/phone within reach   PT Visit Diagnosis: Other abnormalities of gait  and mobility (R26.89);Pain;Difficulty in walking, not elsewhere classified (R26.2) Pain - Right/Left: Right Pain - part of body: Leg;Ankle and joints of foot    Time: 1025-4862 PT Time Calculation (min) (ACUTE ONLY): 23 min   Charges:   PT Evaluation $PT Eval Low Complexity: 1 Low PT Treatments $Gait Training: 8-22 mins        Candie Mile, PT, DPT Physical Therapist Acute Rehabilitation Services [place] 01/25/2022,  10:32 AM

## 2022-01-25 NOTE — Plan of Care (Signed)
  Problem: Clinical Measurements: Goal: Ability to avoid or minimize complications of infection will improve Outcome: Progressing   Problem: Skin Integrity: Goal: Skin integrity will improve Outcome: Progressing   Problem: Education: Goal: Knowledge of General Education information will improve Description: Including pain rating scale, medication(s)/side effects and non-pharmacologic comfort measures Outcome: Progressing   Problem: Health Behavior/Discharge Planning: Goal: Ability to manage health-related needs will improve Outcome: Progressing   Problem: Clinical Measurements: Goal: Ability to maintain clinical measurements within normal limits will improve Outcome: Progressing Goal: Will remain free from infection Outcome: Progressing   Problem: Activity: Goal: Risk for activity intolerance will decrease Outcome: Progressing   Problem: Nutrition: Goal: Adequate nutrition will be maintained Outcome: Progressing   Problem: Coping: Goal: Level of anxiety will decrease Outcome: Progressing   Problem: Elimination: Goal: Will not experience complications related to bowel motility Outcome: Progressing Goal: Will not experience complications related to urinary retention Outcome: Progressing   Problem: Pain Managment: Goal: General experience of comfort will improve Outcome: Progressing   Problem: Safety: Goal: Ability to remain free from injury will improve Outcome: Progressing   Problem: Skin Integrity: Goal: Risk for impaired skin integrity will decrease Outcome: Progressing

## 2022-01-25 NOTE — Progress Notes (Addendum)
Pharmacy Antibiotic Note  Savannah Hobbs is a 76 y.o. female admitted on 01/22/2022 with R leg cellulitis.  Pharmacy has been consulted for Cefepime and Vancomycin dosing.  Today Scr improved from 1 on admission to 0.49 Calculated CrCl 66.6 WBC remain elevated from baseline of 15-17, pt remains afeb and condition is improving  Plan: Adjust Cefepime to 2g Q8h Adjust Vancomycin to '1000mg'$  IV q24h (eAUC ~470.9)    > Goal AUC 400-550 Monitor daily CBC, temp, SCr, and for clinical signs of improvement  F/u cultures and de-escalate antibiotics as able   Height: '5\' 5"'$  (165.1 cm) Weight: 83 kg (183 lb) IBW/kg (Calculated) : 57  Temp (24hrs), Avg:98.6 F (37 C), Min:97.7 F (36.5 C), Max:99.3 F (37.4 C)  Recent Labs  Lab 01/22/22 1910 01/23/22 1053 01/24/22 0336 01/25/22 0224  WBC 42.3* 35.4* 30.5* 30.7*  CREATININE 1.00 0.60 0.62 0.49  LATICACIDVEN 1.6  --   --   --     Estimated Creatinine Clearance: 66.6 mL/min (by C-G formula based on SCr of 0.49 mg/dL).    No Known Allergies   Antimicrobials this admission: Cefepime 11/2 >>  Vancomycin 11/2 >>   Dose adjustments this admission: Cefepime 2g Q12h > 2g Q8h  Microbiology results: 11/3 BCx: ngtd  Thank you for allowing pharmacy to be a part of this patient's care.  Titus Dubin, PharmD PGY1 Pharmacy Resident 01/25/2022 10:08 AM

## 2022-01-25 NOTE — Progress Notes (Signed)
  Progress Note   Patient: Savannah Hobbs CVE:[id number] DOB: 1945/12/28 DOA: 01/22/2022     2 DOS: the patient was seen and examined on 01/25/2022 at 8:32AM      Brief hospital course: Savannah Hobbs is a 76 y.o. 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  In the ER, MRI showed edema and small fluid pockets.  Orthopedics were consulted.     Assessment and Plan: * Cellulitis and abscess of right lower extremity Presenting with redness, pain, and swelling of the lower extremity.  MRI showed extensive edema, and some very very small fluid pockets, no gas  Stable to improving, still too painful to walk on more than a few steps, WBC still elevated.  Ortho evaluated, thankfully no role for surgery. 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 Slightly up WBC due to infection, no clinical concern.   - Follow up with hematology  Secondary hypercoagulable state [facility]) Due to atrial fibrillation  Paroxysmal atrial fibrillation (HCC) Rate controlled - Continue diltiazem - Continue apixaban  Obesity BMI 30.4          Subjective: Patient still having considerable pain, limited walking because of the infection.  White blood cell count still significantly elevated, no fever, no vomiting.     Physical Exam: BP (!) 136/55 (BP  Location: Right Arm)   Pulse 97   Temp 97.7 F (36.5 C) (Oral)   Resp 18   Ht '5\' 5"'$  (1.651 m)   Wt 83 kg   SpO2 91%   BMI 30.45 kg/m   Elderly adult female, lying in bed, has not been out of bed yet RRR, no murmurs, no peripheral edema Respiratory rate normal, lungs clear without rales or wheezes abdomen soft no tenderness palpation or guarding, no ascites or distention The right leg is red, but there is no further swelling, there are a few small blisters, that these appear stable, there is no fluctuant areas, no new redness, no drainage Attention normal, affect appropriate, judgment insight appear normal, face symmetric, speech fluent    Data Reviewed: Orthopedic note reviewed Blood cell count 30,000, no change Hemoglobin 11, no change Sodium 132, no change, creatinine and potassium normal   Family Communication: Son by phone    Disposition: Status is: Inpatient The patient has mild dysplastic syndrome and cirrhosis, and a new right leg infection which failed outpatient therapy  She appears to be consistently improving, but still has significant pain with ambulation, so I will plan to continue 1 more day of treatment in the hospital and discharge tomorrow        Author: Edwin Dada, MD 01/25/2022 11:39 AM  For on call review [email or web address].

## 2022-01-25 NOTE — Progress Notes (Signed)
Patient ID: Savannah Hobbs, female   DOB: 06/11/1945, 76 y.o.   MRN: [id number] Examination the swelling and cellulitis of the right lower extremity continues to improve.  There is good wrinkling of the skin.  There is no draining ulcer.  Plan to continue antibiotics and I will follow-up in the office in 1 week.

## 2022-01-26 ENCOUNTER — Other Ambulatory Visit (HOSPITAL_COMMUNITY): Payer: Self-pay

## 2022-01-26 DIAGNOSIS — D471 Chronic myeloproliferative disease: Secondary | ICD-10-CM | POA: Diagnosis not present

## 2022-01-26 DIAGNOSIS — L03115 Cellulitis of right lower limb: Secondary | ICD-10-CM | POA: Diagnosis not present

## 2022-01-26 DIAGNOSIS — I10 Essential (primary) hypertension: Secondary | ICD-10-CM | POA: Diagnosis not present

## 2022-01-26 DIAGNOSIS — I480 Paroxysmal atrial fibrillation: Secondary | ICD-10-CM

## 2022-01-26 DIAGNOSIS — K7460 Unspecified cirrhosis of liver: Secondary | ICD-10-CM | POA: Diagnosis not present

## 2022-01-26 MED ORDER — CEFADROXIL 500 MG PO CAPS
500.0000 mg | ORAL_CAPSULE | Freq: Two times a day (BID) | ORAL | 0 refills | Status: AC
  Filled 2022-01-26: qty 20, 10d supply, fill #0

## 2022-01-26 MED ORDER — DOXYCYCLINE HYCLATE 100 MG PO TABS
100.0000 mg | ORAL_TABLET | Freq: Two times a day (BID) | ORAL | 0 refills | Status: AC
  Filled 2022-01-26: qty 20, 10d supply, fill #0

## 2022-01-26 MED ORDER — OXYCODONE HCL 5 MG PO TABS
5.0000 mg | ORAL_TABLET | Freq: Four times a day (QID) | ORAL | 0 refills | Status: DC | PRN
  Filled 2022-01-26: qty 16, 4d supply, fill #0

## 2022-01-26 NOTE — Care Management Important Message (Signed)
Important Message  Patient Details  Name: Savannah Hobbs MRN: [id number] Date of Birth: 03/03/1946   Medicare Important Message Given:  Yes     Orbie Pyo 01/26/2022, 3:44 PM

## 2022-01-26 NOTE — Plan of Care (Signed)
  Problem: Clinical Measurements: Goal: Ability to avoid or minimize complications of infection will improve 01/26/2022 1100 by Flossie Dibble, RN Outcome: Completed/Met 01/26/2022 0736 by Flossie Dibble, RN Outcome: Adequate for Discharge   Problem: Skin Integrity: Goal: Skin integrity will improve 01/26/2022 1100 by Flossie Dibble, RN Outcome: Completed/Met 01/26/2022 0736 by Flossie Dibble, RN Outcome: Adequate for Discharge   Problem: Education: Goal: Knowledge of General Education information will improve Description: Including pain rating scale, medication(s)/side effects and non-pharmacologic comfort measures 01/26/2022 1100 by Flossie Dibble, RN Outcome: Completed/Met 01/26/2022 0736 by Flossie Dibble, RN Outcome: Adequate for Discharge   Problem: Health Behavior/Discharge Planning: Goal: Ability to manage health-related needs will improve 01/26/2022 1100 by Flossie Dibble, RN Outcome: Completed/Met 01/26/2022 0736 by Flossie Dibble, RN Outcome: Adequate for Discharge   Problem: Clinical Measurements: Goal: Ability to maintain clinical measurements within normal limits will improve 01/26/2022 1100 by Flossie Dibble, RN Outcome: Completed/Met 01/26/2022 0736 by Flossie Dibble, RN Outcome: Adequate for Discharge Goal: Will remain free from infection 01/26/2022 1100 by Flossie Dibble, RN Outcome: Completed/Met 01/26/2022 0736 by Flossie Dibble, RN Outcome: Adequate for Discharge   Problem: Activity: Goal: Risk for activity intolerance will decrease 01/26/2022 1100 by Flossie Dibble, RN Outcome: Completed/Met 01/26/2022 0736 by Flossie Dibble, RN Outcome: Adequate for Discharge   Problem: Nutrition: Goal: Adequate nutrition will be maintained 01/26/2022 1100 by Flossie Dibble, RN Outcome: Completed/Met 01/26/2022 0736 by Flossie Dibble, RN Outcome: Adequate for Discharge   Problem: Coping: Goal: Level of anxiety will decrease 01/26/2022 1100 by Flossie Dibble, RN Outcome:  Completed/Met 01/26/2022 0736 by Flossie Dibble, RN Outcome: Adequate for Discharge   Problem: Elimination: Goal: Will not experience complications related to bowel motility 01/26/2022 1100 by Flossie Dibble, RN Outcome: Completed/Met 01/26/2022 0736 by Flossie Dibble, RN Outcome: Adequate for Discharge Goal: Will not experience complications related to urinary retention 01/26/2022 1100 by Flossie Dibble, RN Outcome: Completed/Met 01/26/2022 0736 by Flossie Dibble, RN Outcome: Adequate for Discharge   Problem: Pain Managment: Goal: General experience of comfort will improve 01/26/2022 1100 by Flossie Dibble, RN Outcome: Completed/Met 01/26/2022 0736 by Flossie Dibble, RN Outcome: Adequate for Discharge   Problem: Safety: Goal: Ability to remain free from injury will improve 01/26/2022 1100 by Flossie Dibble, RN Outcome: Completed/Met 01/26/2022 0736 by Flossie Dibble, RN Outcome: Adequate for Discharge   Problem: Skin Integrity: Goal: Risk for impaired skin integrity will decrease 01/26/2022 1100 by Flossie Dibble, RN Outcome: Completed/Met 01/26/2022 0736 by Flossie Dibble, RN Outcome: Adequate for Discharge

## 2022-01-26 NOTE — Plan of Care (Signed)
  Problem: Clinical Measurements: Goal: Ability to avoid or minimize complications of infection will improve Outcome: Adequate for Discharge   Problem: Skin Integrity: Goal: Skin integrity will improve Outcome: Adequate for Discharge   Problem: Education: Goal: Knowledge of General Education information will improve Description: Including pain rating scale, medication(s)/side effects and non-pharmacologic comfort measures Outcome: Adequate for Discharge   Problem: Health Behavior/Discharge Planning: Goal: Ability to manage health-related needs will improve Outcome: Adequate for Discharge   Problem: Clinical Measurements: Goal: Ability to maintain clinical measurements within normal limits will improve Outcome: Adequate for Discharge Goal: Will remain free from infection Outcome: Adequate for Discharge   Problem: Activity: Goal: Risk for activity intolerance will decrease Outcome: Adequate for Discharge   Problem: Nutrition: Goal: Adequate nutrition will be maintained Outcome: Adequate for Discharge   Problem: Coping: Goal: Level of anxiety will decrease Outcome: Adequate for Discharge   Problem: Elimination: Goal: Will not experience complications related to bowel motility Outcome: Adequate for Discharge Goal: Will not experience complications related to urinary retention Outcome: Adequate for Discharge   Problem: Pain Managment: Goal: General experience of comfort will improve Outcome: Adequate for Discharge   Problem: Safety: Goal: Ability to remain free from injury will improve Outcome: Adequate for Discharge   Problem: Skin Integrity: Goal: Risk for impaired skin integrity will decrease Outcome: Adequate for Discharge

## 2022-01-26 NOTE — TOC Initial Note (Signed)
Transition of Care [facility]) - Initial/Assessment Note    Patient Details  Name: Savannah Hobbs MRN: [id number] Date of Birth: 09-Oct-1945  Transition of Care [facility]) CM/SW Contact:    Curlene Labrum, RN Phone Number: 01/26/2022, 9:27 AM  Clinical Narrative:                 CM met with the patient at the bedside to discuss transitions of care needs  - Admitted with Right lower leg cellulitis.  PT recommending home health.  Choice offered to the patient and she did not have a preference.  The patient asked to reach out to [facility].  Will call and follow up with [facility] agency.  DME at home includes RW and WC.  PCP - Dr. Currie Paris.  Transportation - The patient's husband will be providing transportation to home by car once patient is medically stable for discharge to home.  Expected Discharge Plan: [place] Barriers to Discharge: Continued Medical Work up   Patient Goals and CMS Choice Patient states their goals for this hospitalization and ongoing recovery are:: To return home CMS Medicare.gov Compare Post Acute Care list provided to:: Patient Choice offered to / list presented to : Patient  Expected Discharge Plan and Services Expected Discharge Plan: [place]   Discharge Planning Services: CM Consult Post Acute Care Choice: [place] arrangements for the past 2 months: Single Family Home                           HH Arranged: PT          Prior Living Arrangements/Services Living arrangements for the past 2 months: Single Family Home Lives with:: Spouse Patient language and need for interpreter reviewed:: Yes Do you feel safe going back to the place where you live?: Yes      Need for Family Participation in Patient Care: Yes (Comment) Care giver support system in place?: Yes (comment) Current home services: DME (RW and WC at home) Criminal Activity/Legal Involvement Pertinent to Current  Situation/Hospitalization: No - Comment as needed  Activities of Daily Living      Permission Sought/Granted Permission sought to share information with : Case Manager, Family Supports Permission granted to share information with : Yes, Verbal Permission Granted     Permission granted to share info w AGENCY: [facility] in [place] area  Permission granted to share info w Relationship: spouse - Azalia Bilis - [phone]     Emotional Assessment Appearance:: Appears stated age Attitude/Demeanor/Rapport: Gracious Affect (typically observed): Accepting Orientation: : Oriented to Self, Oriented to Place, Oriented to  Time, Oriented to Situation Alcohol / Substance Use: Not Applicable Psych Involvement: No (comment)  Admission diagnosis:  Cellulitis, leg [L03.119] Cellulitis of right leg [L03.115] Cellulitis and abscess of right lower extremity [L03.115, L02.415] Patient Active Problem List   Diagnosis Date Noted   Cirrhosis, non-alcoholic ([place]) 54/98/2641   Essential hypertension 01/24/2022   Hyponatremia 01/24/2022   Hyperlipidemia 01/24/2022   Cellulitis and abscess of right lower extremity 01/23/2022   Portal hypertensive gastropathy and coloapthy ([place]) 11/27/2021   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[place]) 11/27/2021   Long term current use of anticoagulant-Eliquis-A-fib 58/30/9407   Periumbilical abdominal pain    NASH (nonalcoholic steatohepatitis)    Myelodysplasia-Myeloproliferative disease (HCC)    Thrombocytopenia (HCC)    Other ascites    Periumbilical hernia    Paroxysmal atrial fibrillation ([place]) 04/16/2020  Secondary hypercoagulable state ([place]) 04/16/2020   Obesity 01/23/2020   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

## 2022-01-26 NOTE — Discharge Summary (Signed)
Physician Discharge Summary   Patient: Savannah Hobbs MRN: [id number] DOB: Jul 15, 1945  Admit date:     01/22/2022  Discharge date: 01/26/22  Discharge Physician: Edwin Dada   PCP: Moshe Cipro, MD     Recommendations at discharge:  Follow up with PCP Dr. Currie Paris or [facility] in 1 week Dr. Currie Paris: Please obtain CBC in 1 week Please extend antibiotics as needed  Follow up with Orthopedic Surgery Dr. 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  In the ER, MRI showed edema and small fluid pockets.  Orthopedics were consulted.     * Cellulitis and abscess of right lower extremity Presented with redness, pain, and swelling of the lower extremity which had gotten worse with outpatient Bactrim.  MRI showed extensive edema, and some very very small fluid pockets, no gas  Admitted and started on vancomycin and cefepime.  With elevation, leg swelling improved markedly, WBC down to baseline 30K.   Ortho evaluated, thankfully no role for surgery.  Pain improved, taking orals well, temp < 100 F, heart rate < 100bpm, stable for discharge.  Blisters imply strep but could not rule out MRSA.  Discharged on 10 days cefadroxil and doxycycline.   Recommend close follow up, elevation.   Hyponatremia Mild, due to cirrhosis, mild stable  Cirrhosis, non-alcoholic Appeared compensated  Myelodysplasia-Myeloproliferative disease  ([place]) Known history of myelodysplastic syndrome/MPN overlap with baseline WBC ~20-25K, she follows with an outside health system by hematology.   Here, WBC up from baseline, but similar to when she had had COVID last year.   - Recommend CBC with PCP in 1 week and referral to Hematology follow up if concerns              [facility] was reviewed for this patient prior to discharge.   Consultants: Orthopedics Procedures performed: MRI leg Disposition: Home with home health   DISCHARGE MEDICATION: Allergies as of 01/26/2022   No Known Allergies      Medication List     STOP taking these medications    sulfamethoxazole-trimethoprim 800-160 MG tablet Commonly known as: BACTRIM DS       TAKE these medications    acetaminophen 500 MG tablet Commonly known as: TYLENOL Take 500 mg by mouth every 6 (six) hours as needed for moderate pain.   apixaban 5 MG Tabs tablet Commonly known as: ELIQUIS Take 1 tablet (5 mg total) by mouth 2 (two) times daily.   cefadroxil 500 MG capsule Commonly known as: DURICEF Take 1 capsule (500 mg total) by mouth 2 (two) times daily for 10 days.   cetirizine 10 MG tablet Commonly known as: ZYRTEC Take 10 mg by mouth in the morning.   colchicine 0.6 MG tablet Take 0.6 mg by mouth daily.   cyanocobalamin 1000 MCG tablet Commonly known as: VITAMIN B12 Take 1,000 mcg by mouth daily.   diltiazem 120 MG 24 hr capsule Commonly known  as: CARDIZEM CD Take 1 capsule (120 mg total) by mouth daily.   docusate sodium 100 MG capsule Commonly known as: COLACE Take 100 mg by mouth 2 (two) times daily.   doxycycline 100 MG capsule Commonly known as: VIBRAMYCIN Take 1 capsule (100 mg total) by mouth 2 (two) times daily for 10 days.   esomeprazole 20 MG capsule Commonly known as: NEXIUM Take 20 mg by mouth daily before breakfast.   flecainide 100 MG tablet Commonly known as: TAMBOCOR Take 2 tablets  (200 mg total) by mouth as directed. Take 2 tablets as needed daily for Afib   furosemide 40 MG tablet Commonly known as: LASIX TAKE 1 TABLET BY MOUTH EVERY DAY   oxyCODONE 5 MG immediate release tablet Commonly known as: Oxy IR/ROXICODONE Take 1 tablet (5 mg total) by mouth every 6 (six) hours as needed for moderate pain.   simvastatin 20 MG tablet Commonly known as: ZOCOR Take 20 mg by mouth daily.   spironolactone 100 MG tablet Commonly known as: ALDACTONE Take 1 tablet (100 mg total) by mouth daily.   vitamin C 1000 MG tablet Take 1,000 mg by mouth daily.        Follow-up Information     Newt Minion, MD Follow up in 1 week(s).   Specialty: Orthopedic Surgery Contact information: [place] [address] [phone]         Moshe Cipro, MD. Schedule an appointment as soon as possible for a visit.   Specialty: Internal Medicine Contact information: [place] [address] [phone]         [facility] [place] Follow up.   Why: [facility] will follow up with you in the next 24-48 hours to schedule therapy visits after discharge home from the hospital. Contact information: [place] [phone]                 Discharge Instructions     Discharge instructions   Complete by: As directed    **IMPORTANT DISCHARGE INSTRUCTIONS**    From Dr. Loleta Books: You were admitted for cellulitis of the right leg. You were treated with antibiotics and should continue them for the next 10 days at least  Go see Dr. Layne Benton office and Dr. Jess Barters office in the next 5-10 days Spread the appointments out so that you have frequent eyes on your leg Take pictures of the leg to show how it is improving or not  Take the two antibiotics: Take doxycycline 100 mg twice daily Take cefadroxil 500 mg twice daily  Take them for 10 days and ask Dr. Currie Paris or Dr. Sharol Given if you need  to continue longer Avoid manipulating, rubbing, or poking the blisters. If they get wet, they will be more fragile.  They may rupture (they probably will).  If or when they do, just cover them with a nonstick bandage, use some vaseline to keep them moist, and change the bandaid once daily and continue antibiotics.   Get Dr. Layne Benton office to check your Hgb and WBC in 1 week   Remember what we saw in the hospital when you weren't elevating your leg in bed. Unless you are sitting at the table to eat or walking around to get exercise, keep that leg elevated at the level of your heart for the next week.   Increase activity slowly   Complete by: As directed    No wound care   Complete  by: As directed        Discharge Exam: Filed Weights   01/23/22 0130  Weight: 83 kg    General: Pt is alert, awake, not in acute distress Cardiovascular: RRR, nl S1-S2, no murmurs appreciated.   No LE edema.   Respiratory: Normal respiratory rate and rhythm.  CTAB without rales or wheezes. Abdominal: Abdomen soft and non-tender.  No distension or HSM.   MSK: The leg has slightly more swelling again given dependent more the last 24 hours, but overall improved from admission greatly.  Several blisters, blood filled given Eliquis, none ruptures, no draining, no fluctuance.   Neuro/Psych: Strength symmetric in upper and lower extremities.  Judgment and insight appear normal.   Condition at discharge: good  The results of significant diagnostics from this hospitalization (including imaging, microbiology, ancillary and laboratory) are listed below for reference.   Imaging Studies: CT Angio Chest Pulmonary Embolism (PE) W or WO Contrast  Result Date: 01/23/2022 CLINICAL DATA:  High probability of pulmonary embolism suspected. EXAM: CT ANGIOGRAPHY CHEST WITH CONTRAST TECHNIQUE: Multidetector CT imaging of the chest was performed using the standard protocol during bolus administration of intravenous  contrast. Multiplanar CT image reconstructions and MIPs were obtained to evaluate the vascular anatomy. RADIATION DOSE REDUCTION: This exam was performed according to the departmental dose-optimization program which includes automated exposure control, adjustment of the mA and/or kV according to patient size and/or use of iterative reconstruction technique. CONTRAST:  30m OMNIPAQUE IOHEXOL 350 MG/ML SOLN COMPARISON:  01/22/2020 FINDINGS: Cardiovascular: Heart is enlarged. Aortic valve and mitral valve calcification evident. Coronary artery calcification is evident. Mild atherosclerotic calcification is noted in the wall of the thoracic aorta. There is no filling defect within the opacified pulmonary arteries to suggest the presence of an acute pulmonary embolus. Mediastinum/Nodes: Similar increased number of mediastinal lymph nodes some of which are borderline to mildly enlarged. 10 mm short axis right paratracheal node on 46/5 was 11 mm previously (remeasured). There is no hilar lymphadenopathy. The esophagus has normal imaging features. There is no axillary lymphadenopathy. Lungs/Pleura: Biapical pleuroparenchymal scarring is similar to prior. Diffuse interlobular septal thickening noted bilaterally scattered areas of mosaic ground-glass attenuation. Dependent atelectasis noted in the lower lobes bilaterally. No suspicious pulmonary nodule or mass. No pleural effusion. Upper Abdomen: Spleen is enlarged but incompletely visualized. Nodular hepatic contours consistent with cirrhosis Musculoskeletal: No worrisome lytic or sclerotic osseous abnormality. Degenerative changes noted right shoulder. Review of the MIP images confirms the above findings. IMPRESSION: 1. No CT evidence for acute pulmonary embolus. 2. Cardiomegaly with diffuse interlobular septal thickening and scattered areas of mosaic ground-glass attenuation. Imaging features suggest pulmonary edema. 3. Similar increased number of mediastinal lymph nodes  some of which are borderline to mildly enlarged. These are likely reactive. 4. Hepatic cirrhosis with splenomegaly. 5.  Aortic Atherosclerosis (ICD10-I70.0). Electronically Signed   By: EMisty StanleyM.D.   On: 01/23/2022 12:00   MR TIBIA FIBULA RIGHT WO CONTRAST  Result Date: 01/23/2022 CLINICAL DATA:  Soft tissue infection of the lower leg. EXAM: MRI OF LOWER RIGHT EXTREMITY WITHOUT CONTRAST TECHNIQUE: Multiplanar, multisequence MR imaging of the right lower leg was performed. No intravenous contrast was administered. COMPARISON:  None Available. FINDINGS: Bones/Joint/Cartilage No fracture or dislocation. Normal alignment. No joint effusion. No marrow signal abnormality. Muscles and Tendons Muscles are normal. Soft tissue No fluid collection or hematoma. No soft tissue mass. Severe soft tissue edema in the subcutaneous fat with skin thickening throughout the right lower leg concerning for  cellulitis. 7 mm fluid collection along the anterolateral aspect of the ankle. 6 mm fluid collection along the anterior aspect of the distal anterior right lower leg. Focal 10 mm nodular fluid collection in the posterior mid right lower leg. IMPRESSION: 1. Severe soft tissue edema in the subcutaneous fat with skin thickening throughout the right lower leg concerning for cellulitis. 7 mm fluid collection along the anterolateral aspect of the ankle, 6 mm fluid collection along the anterior aspect of the distal anterior right lower leg, and focal 10 mm nodular fluid collection in the posterior mid right lower leg. These are concerning for small abscesses. Electronically Signed   By: Kathreen Devoid M.D.   On: 01/23/2022 09:39   DG Tibia/Fibula Right  Result Date: 01/23/2022 CLINICAL DATA:  Cellulitis EXAM: RIGHT TIBIA AND FIBULA - 2 VIEW COMPARISON:  None Available. FINDINGS: Status post right total knee arthroplasty. Normal alignment. No acute fracture or dislocation. No erosions or abnormal periosteal reaction. Diffuse  subcutaneous edema seen within the right lower extremity and marked soft tissue swelling of the visualized right midfoot in keeping with given history of cellulitis. No subcutaneous gas identified. IMPRESSION: 1. Diffuse subcutaneous edema and soft tissue swelling in keeping with given history of cellulitis. No subcutaneous gas. Electronically Signed   By: Fidela Salisbury M.D.   On: 01/23/2022 01:57    Microbiology: Results for orders placed or performed during the hospital encounter of 01/22/22  Culture, blood (routine x 2)     Status: None (Preliminary result)   Collection Time: 01/23/22 10:53 AM   Specimen: BLOOD LEFT HAND  Result Value Ref Range Status   Specimen Description BLOOD LEFT HAND  Final   Special Requests   Final    BOTTLES DRAWN AEROBIC AND ANAEROBIC Blood Culture results may not be optimal due to an inadequate volume of blood received in culture bottles   Culture   Final    NO GROWTH 3 DAYS Performed at [facility], [place] [address], [place], [place] [address]    Report Status PENDING  Incomplete  Culture, blood (routine x 2)     Status: None (Preliminary result)   Collection Time: 01/23/22 10:57 AM   Specimen: BLOOD  Result Value Ref Range Status   Specimen Description BLOOD SITE NOT SPECIFIED  Final   Special Requests   Final    BOTTLES DRAWN AEROBIC AND ANAEROBIC Blood Culture results may not be optimal due to an inadequate volume of blood received in culture bottles   Culture   Final    NO GROWTH 3 DAYS Performed at [facility], [place] [address] [place],  [address]    Report Status PENDING  Incomplete    Labs: CBC: Recent Labs  Lab 01/22/22 1910 01/23/22 1053 01/24/22 0336 01/25/22 0224  WBC 42.3* 35.4* 30.5* 30.7*  NEUTROABS 34.3* 32.6*  --   --   HGB 13.7 12.0 11.7* 11.7*  HCT 40.3 35.8* 35.0* 34.5*  MCV 93.1 93.0 93.1 92.0  PLT 222 212 224 578   Basic Metabolic Panel: Recent Labs  Lab 01/22/22 1910 01/23/22 1053 01/24/22 0336  01/25/22 0224  NA 134* 134* 133* 132*  K 4.1 4.1 4.3 4.0  CL 99 103 102 99  CO2 '22 22 22 24  '$ GLUCOSE 92 106* 87 97  BUN '23 16 11 10  '$ CREATININE 1.00 0.60 0.62 0.49  CALCIUM 8.5* 8.2* 8.3* 8.3*   Liver Function Tests: Recent Labs  Lab 01/24/22 0336  AST 30  ALT 15  ALKPHOS 63  BILITOT 1.8*  PROT 6.4*  ALBUMIN 2.6*   CBG: No results for input(s): "GLUCAP" in the last 168 hours.  Discharge time spent: approximately 35 minutes spent on discharge counseling, evaluation of patient on day of discharge, and coordination of discharge planning with nursing, social work, pharmacy and case management  Signed: Edwin Dada, MD [facility] 01/26/2022

## 2022-01-26 NOTE — Progress Notes (Signed)
Physical Therapy Treatment Patient Details Name: Savannah Hobbs MRN: [id number] DOB: 08/23/1945 Today's Date: 01/26/2022   History of Present Illness 76 y.o. F admitted 11/2 with cellulitis and abscess of RLE. PMHx: NASH cirrhosis, portal HTN and varices, pAF on Eliquis, HTN, AS, low grade myelodysplastic syndrome and obesity    PT Comments    Pt pleasant and reports continued pain RLE limiting function and standing tolerance. Pt able to walk to and from bathroom but denied further activity or stairs due to pain. Pt educated for walking program, stairs and HEP.     Recommendations for follow up therapy are one component of a multi-disciplinary discharge planning process, led by the attending physician.  Recommendations may be updated based on patient status, additional functional criteria and insurance authorization.  Follow Up Recommendations  Home health PT     Assistance Recommended at Discharge Intermittent Supervision/Assistance  Patient can return home with the following A little help with walking and/or transfers;A little help with bathing/dressing/bathroom;Help with stairs or ramp for entrance;Assist for transportation;Assistance with cooking/housework   Equipment Recommendations  None recommended by PT    Recommendations for Other Services       Precautions / Restrictions Precautions Precautions: None     Mobility  Bed Mobility Overal bed mobility: Modified Independent             General bed mobility comments: exiting to right as she will at home    Transfers Overall transfer level: Modified independent                 General transfer comment: pt able to stand from bed and toilet with rail without physical assist    Ambulation/Gait Ambulation/Gait assistance: Supervision Gait Distance (Feet): 20 Feet Assistive device: Rolling walker (2 wheels) Gait Pattern/deviations: Step-to pattern, Decreased stance time - right, Decreased weight shift  to right, Antalgic   Gait velocity interpretation: <1.8 ft/sec, indicate of risk for recurrent falls   General Gait Details: pt with reliance on RW with initial cues for sequence and pt able to demonstrate. PT walked 20' x 2 trials with seated rest between, denied further trials or distance due to pain. Pt also denied attempting stairs with verbal education for backward with RW to ascend step into house   Stairs             Wheelchair Mobility    Modified Rankin (Stroke Patients Only)       Balance Overall balance assessment: Needs assistance Sitting-balance support: No upper extremity supported, Feet supported Sitting balance-Leahy Scale: Good     Standing balance support: Bilateral upper extremity supported, No upper extremity supported Standing balance-Leahy Scale: Fair Standing balance comment: able to stand at sink without support, RW for gait                            Cognition Arousal/Alertness: Awake/alert Behavior During Therapy: WFL for tasks assessed/performed Overall Cognitive Status: Within Functional Limits for tasks assessed                                          Exercises      General Comments        Pertinent Vitals/Pain Pain Assessment Pain Score: 7  Pain Location: RLE Pain Descriptors / Indicators: Aching, Sore Pain Intervention(s): Limited activity within patient's tolerance, Monitored during session, Repositioned  Home Living Family/patient expects to be discharged to:: Private residence Living Arrangements: Spouse/significant other                      Prior Function            PT Goals (current goals can now be found in the care plan section) Progress towards PT goals: Progressing toward goals    Frequency    Min 3X/week      PT Plan Current plan remains appropriate    Co-evaluation              AM-PAC PT "6 Clicks" Mobility   Outcome Measure  Help needed turning from  your back to your side while in a flat bed without using bedrails?: None Help needed moving from lying on your back to sitting on the side of a flat bed without using bedrails?: None Help needed moving to and from a bed to a chair (including a wheelchair)?: None Help needed standing up from a chair using your arms (e.g., wheelchair or bedside chair)?: None Help needed to walk in hospital room?: A Little Help needed climbing 3-5 steps with a railing? : A Little 6 Click Score: 22    End of Session   Activity Tolerance: Patient limited by pain Patient left: in bed;with call bell/phone within reach Nurse Communication: Mobility status PT Visit Diagnosis: Other abnormalities of gait and mobility (R26.89);Pain;Difficulty in walking, not elsewhere classified (R26.2)     Time: 1038-1050 PT Time Calculation (min) (ACUTE ONLY): 12 min  Charges:  $Gait Training: 8-22 mins                     Bayard Males, PT Acute Rehabilitation Services Office: [place] 01/26/2022, 11:34 AM

## 2022-01-28 LAB — CULTURE, BLOOD (ROUTINE X 2)
Culture: NO GROWTH
Culture: NO GROWTH

## 2022-03-03 ENCOUNTER — Other Ambulatory Visit: Payer: Self-pay

## 2022-03-03 ENCOUNTER — Telehealth: Payer: Self-pay | Admitting: Cardiology

## 2022-03-03 MED ORDER — APIXABAN 5 MG PO TABS: 5.0000 mg | ORAL_TABLET | Freq: Two times a day (BID) | ORAL | 2 refills | Status: DC

## 2022-03-03 NOTE — Telephone Encounter (Signed)
Pt c/o medication issue:  1. Name of Medication:   apixaban (ELIQUIS) 5 MG TABS tablet (    2. How are you currently taking this medication (dosage and times per day)? Take 1 tablet (5 mg total) by mouth 2 (two) times daily.   3. Are you having a reaction (difficulty breathing--STAT)? No  4. What is your medication issue? Pt states that she is currently in [place]. She would like a prescription for medication to be sent to her so she is able to get a refill. Please advise

## 2022-03-03 NOTE — Telephone Encounter (Signed)
Contacted patient, advised that she gets her prescription from [place] and they need a updated prescription. Advised it needed to be printed and signed and we could fax it.   Fax number [phone].  Will route to primary RN- hayley, can you print on prescription paper and have Dr.Schumann sign?   Thank you!

## 2022-03-04 NOTE — Telephone Encounter (Signed)
Rx printed and signed. Patient will come by office to pick up rx.

## 2022-03-09 ENCOUNTER — Ambulatory Visit (HOSPITAL_COMMUNITY): Payer: Medicare Other

## 2022-03-19 ENCOUNTER — Encounter: Payer: Self-pay | Admitting: Cardiology

## 2022-03-19 NOTE — Telephone Encounter (Signed)
Informed patient that her Rx is ready for pickup. Spouse will come for it.

## 2022-03-19 NOTE — Telephone Encounter (Signed)
Error

## 2022-03-19 NOTE — Telephone Encounter (Signed)
FYI--Patient is following up, stating that her husband plans to come by the office to pick up her Rx. She just wanted to be sure that this will still be available for her as she understands that it has been a few weeks since this was last discussed.

## 2022-04-02 ENCOUNTER — Other Ambulatory Visit: Payer: Self-pay | Admitting: Cardiology

## 2022-04-02 DIAGNOSIS — I480 Paroxysmal atrial fibrillation: Secondary | ICD-10-CM

## 2022-04-23 ENCOUNTER — Ambulatory Visit (HOSPITAL_COMMUNITY): Payer: Medicare Other

## 2022-06-19 IMAGING — CT CT ABD-PELV W/O CM
2 of 4 series · 17 of 46 positions shown, 19 images · non-contrast
Comparison: None.

CLINICAL DATA: Abdominal pain with hernia suspected

EXAM:
CT ABDOMEN AND PELVIS WITHOUT CONTRAST
TECHNIQUE: Multidetector CT imaging of the abdomen and pelvis was performed
following the standard protocol without IV contrast.

[Series 3: a/p w/o 5mm · axial · non-contrast · 0.79mm/px · z∈[-1106,-666]mm · 14 of 98 slices shown, 16 images]
[im 5/98  soft-tissue]
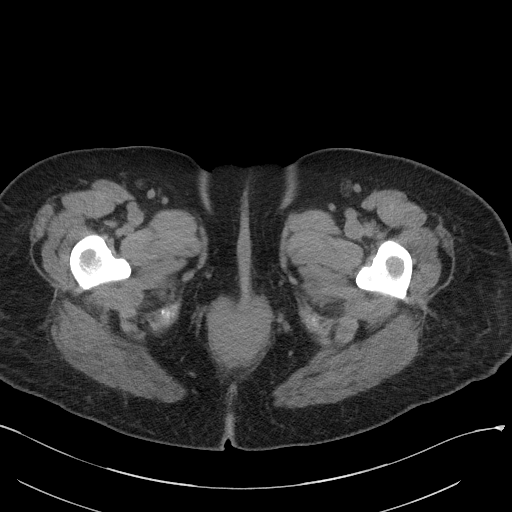
[im 5/98  bone]
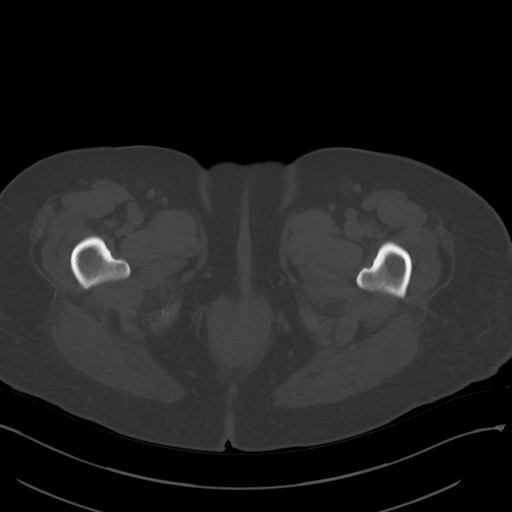
[im 13/98  soft-tissue]
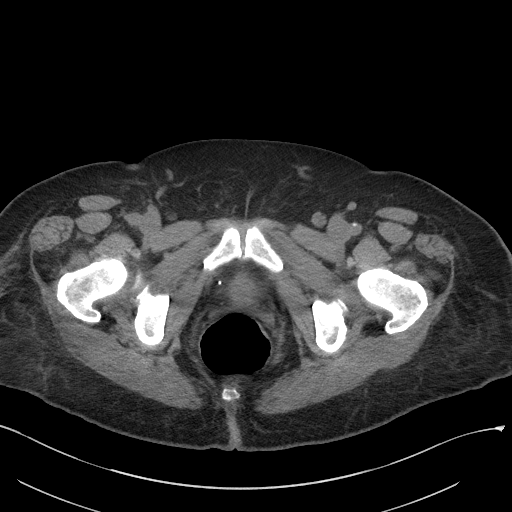
[im 21/98  soft-tissue]
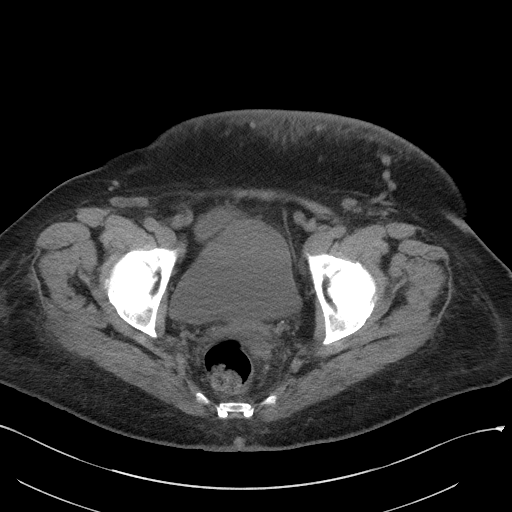
[im 25/98  soft-tissue]
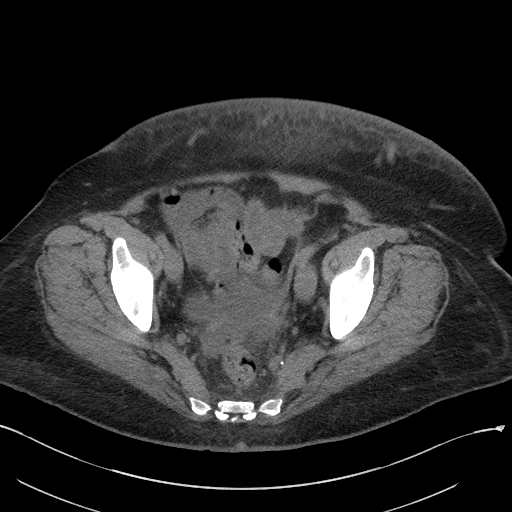
[im 33/98  soft-tissue]
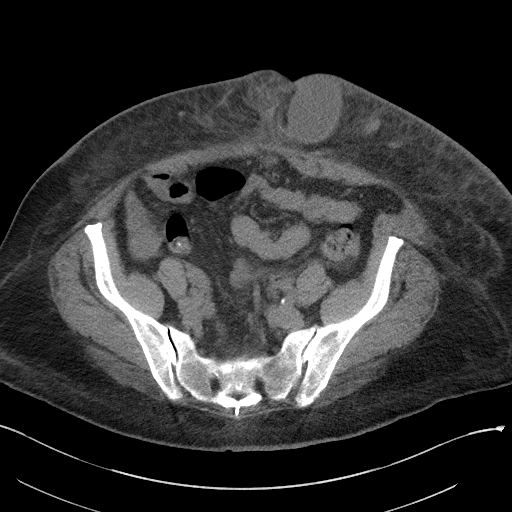
[im 41/98  soft-tissue]
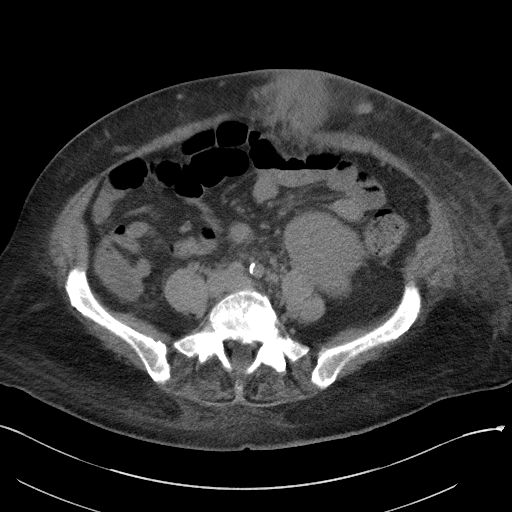
[im 45/98  soft-tissue]
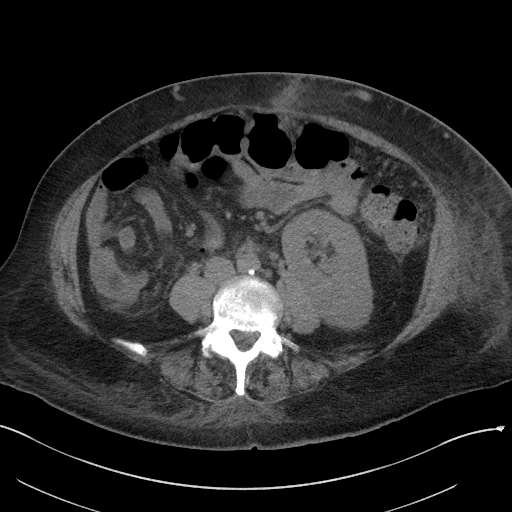
[im 53/98  soft-tissue]
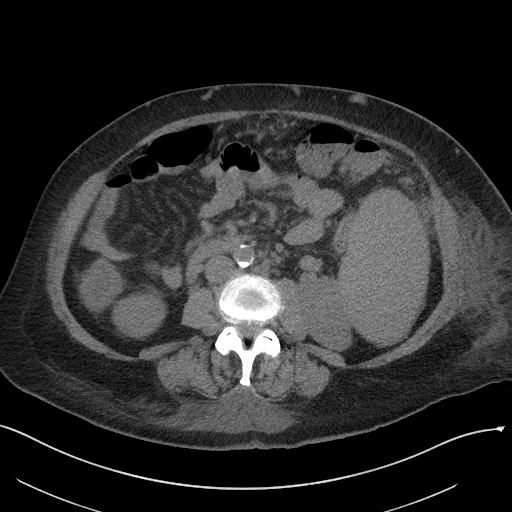
[im 57/98  soft-tissue]
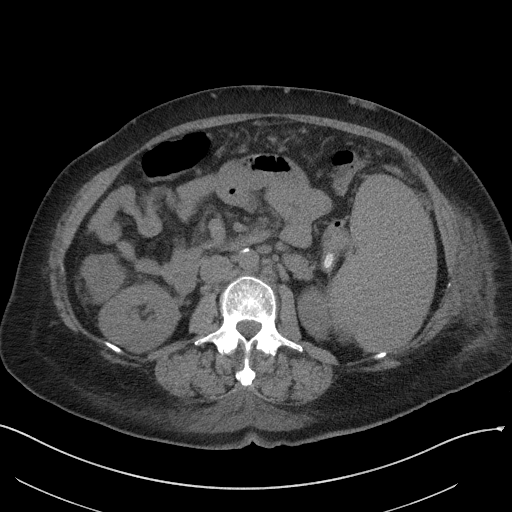
[im 57/98  bone]
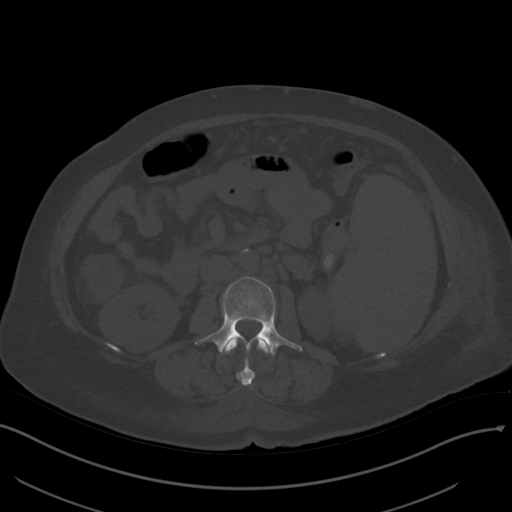
[im 65/98  soft-tissue]
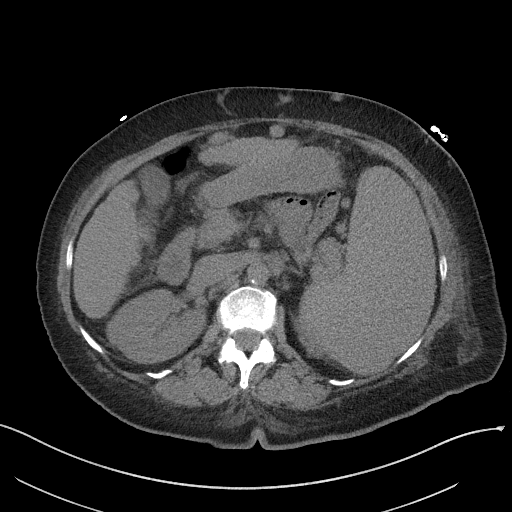
[im 73/98  soft-tissue]
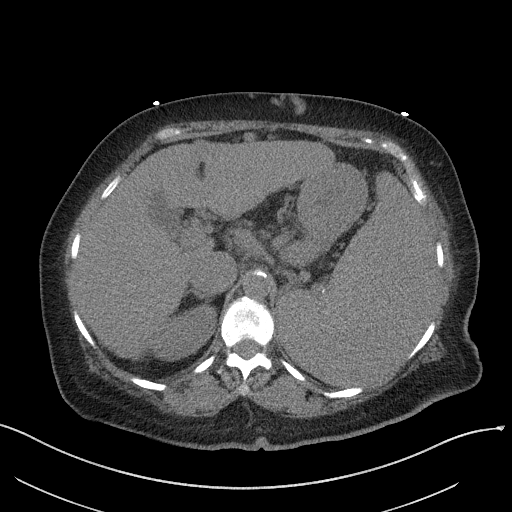
[im 77/98  soft-tissue]
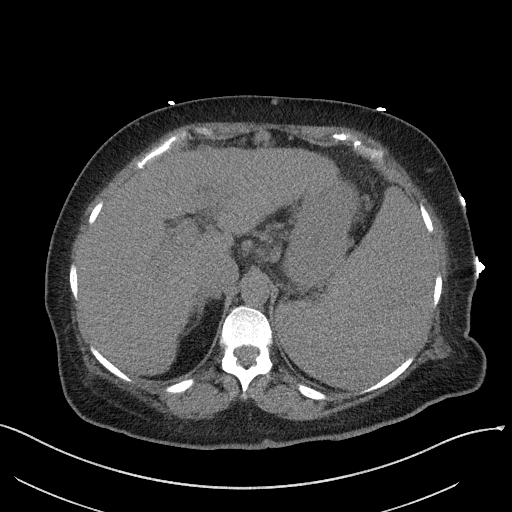
[im 85/98  soft-tissue]
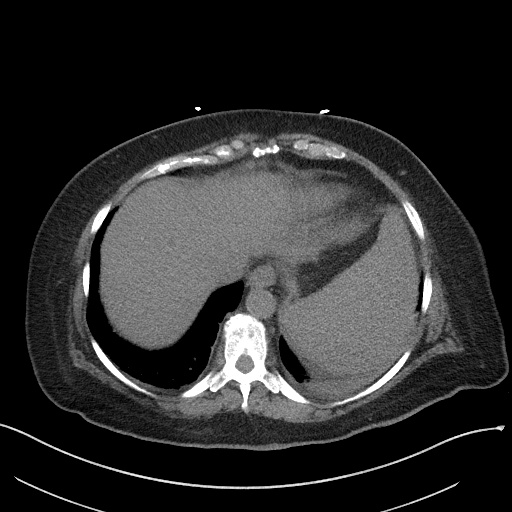
[im 93/98  soft-tissue]
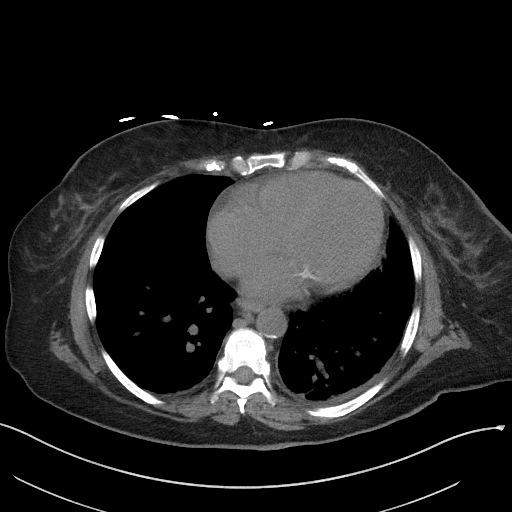

[Series 6: a/p w/o cor · coronal · non-contrast · 0.95mm/px · 3 of 146 slices shown]
[im 49/146  soft-tissue]
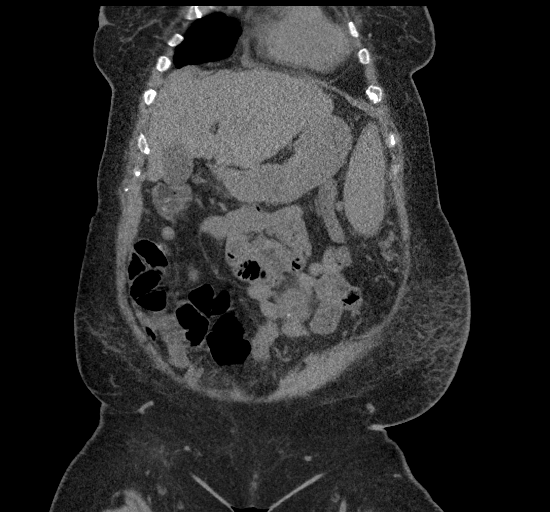
[im 65/146  soft-tissue]
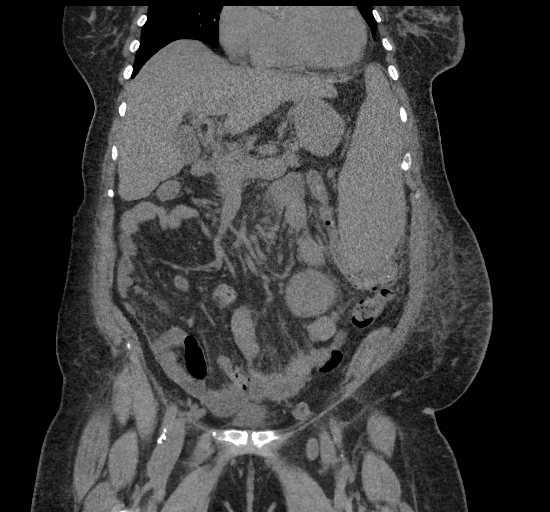
[im 81/146  soft-tissue]
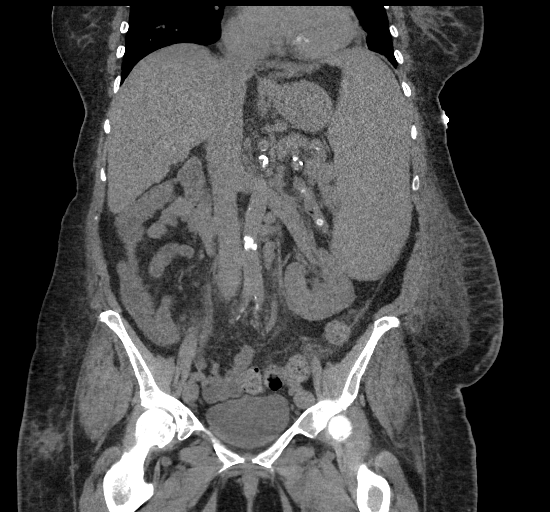

[17 of 46 positions shown; findings below may reference images not displayed]

FINDINGS: Lower chest:  Borderline septal thickening at the bases.

Hepatobiliary: Cirrhotic liver morphology with recanalized umbilical
vein and splenomegaly.Cholelithiasis. No evidence of cholecystitis.

Pancreas: Generalized atrophy

Spleen: Enlarged with 22 cm craniocaudal span.

Adrenals/Urinary Tract: Negative adrenals. No hydronephrosis or
ureteral calculi. Two punctate left renal calculi. Depressed left
kidney due to splenomegaly. Unremarkable bladder.

Stomach/Bowel:  No obstruction. No bowel wall thickening.

Vascular/Lymphatic: Varices in the upper abdomen. Atheromatous
calcification of the aorta and branch vessels

Reproductive:Hysterectomy.

Other: Midline hernia containing ascitic fluid and congested fat.
Diffuse subcutaneous edema of the anterior abdominal wall.

Musculoskeletal: Osteopenia and lumbar spine degeneration. No
emergent finding.
IMPRESSION: 1. Large periumbilical hernia containing congested fat and ascitic
fluid.
2. Anterior abdominal wall edema suggesting cellulitis.
3. Cirrhosis with portal hypertension.
4. Cholelithiasis and left nephrolithiasis.

## 2022-06-25 IMAGING — CT CT ABD-PELV W/ CM
2 of 5 series · 15 of 46 positions shown, 17 images · IV contrast (APPLIED)
Comparison: 01/18/2021

CLINICAL DATA: Abdominal pain

EXAM:
CT ABDOMEN AND PELVIS WITH CONTRAST
TECHNIQUE: Multidetector CT imaging of the abdomen and pelvis was performed
using the standard protocol following bolus administration of
intravenous contrast.
CONTRAST:  100mL OMNIPAQUE IOHEXOL 350 MG/ML SOLN

[Series 3: abd/ pelvis 5.0 i30f 2 · axial · 0.91mm/px · z∈[-602,-197]mm · 12 of 93 slices shown, 14 images]
[im 6/93  soft-tissue]
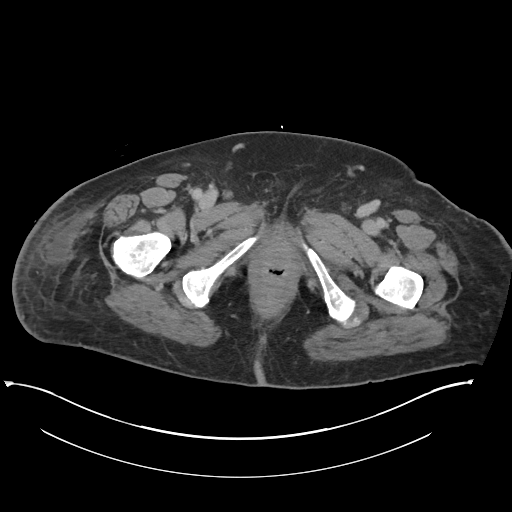
[im 6/93  bone]
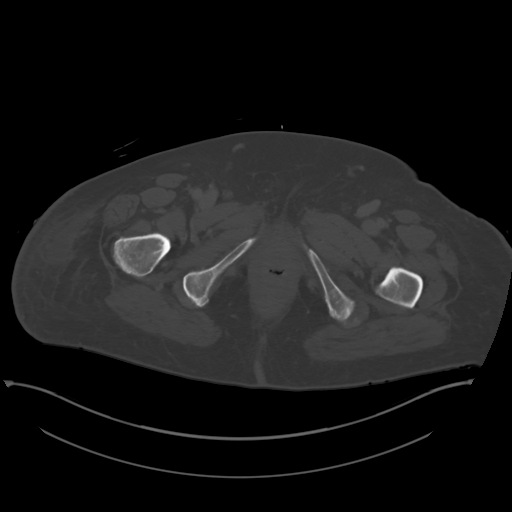
[im 16/93  soft-tissue]
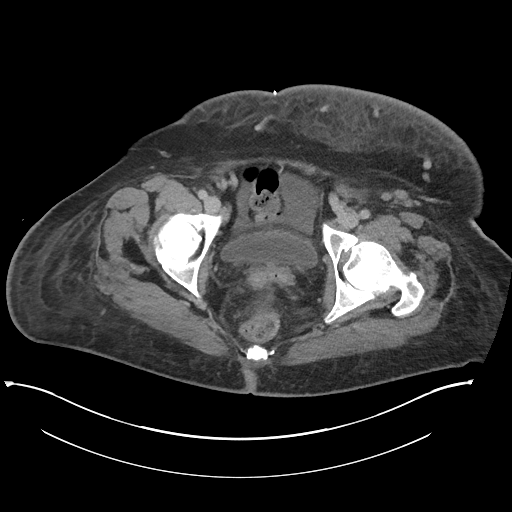
[im 21/93  soft-tissue]
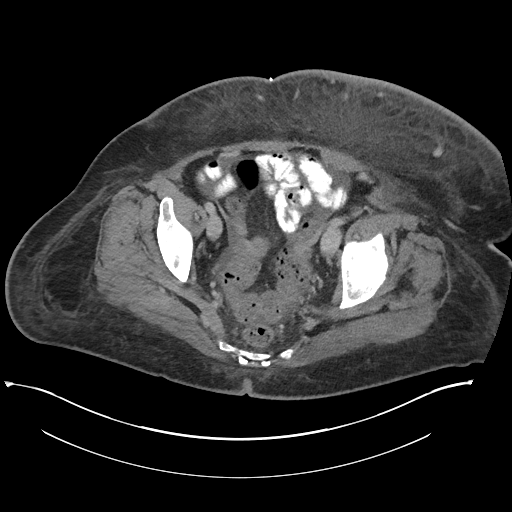
[im 26/93  soft-tissue]
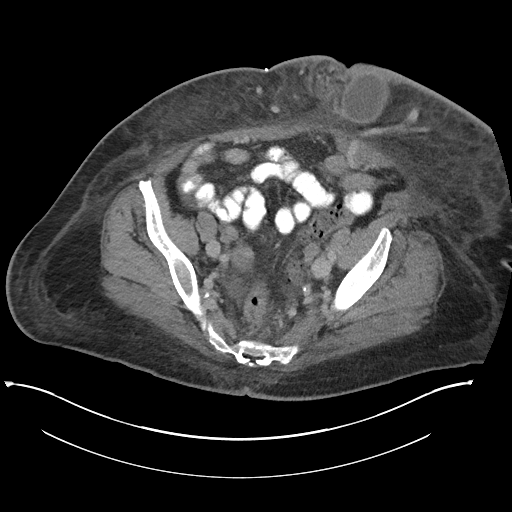
[im 36/93  soft-tissue]
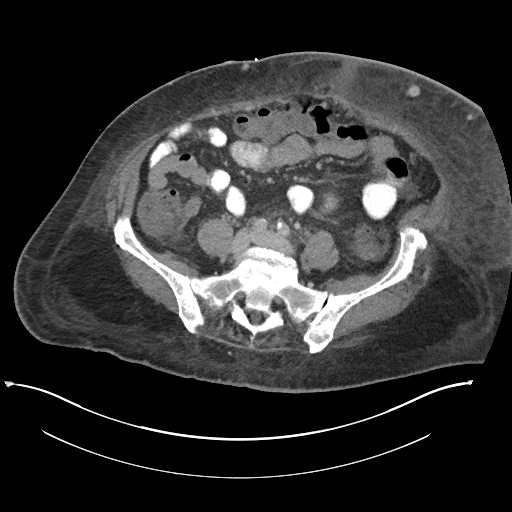
[im 41/93  soft-tissue]
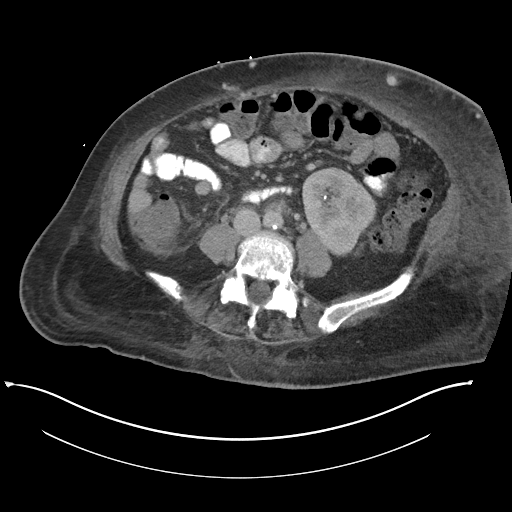
[im 52/93  soft-tissue]
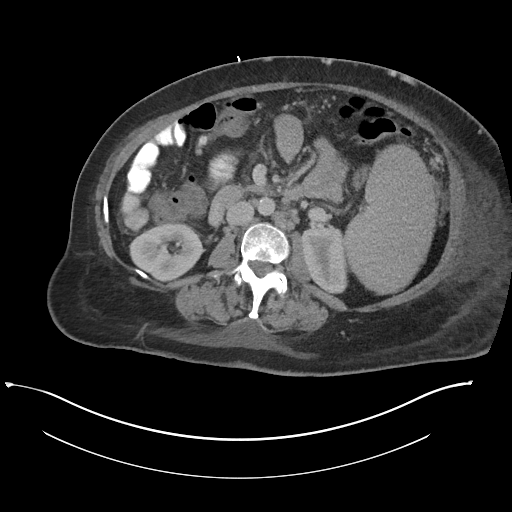
[im 57/93  soft-tissue]
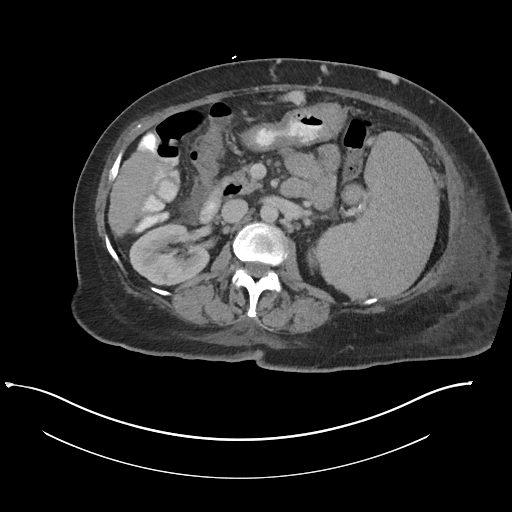
[im 67/93  soft-tissue]
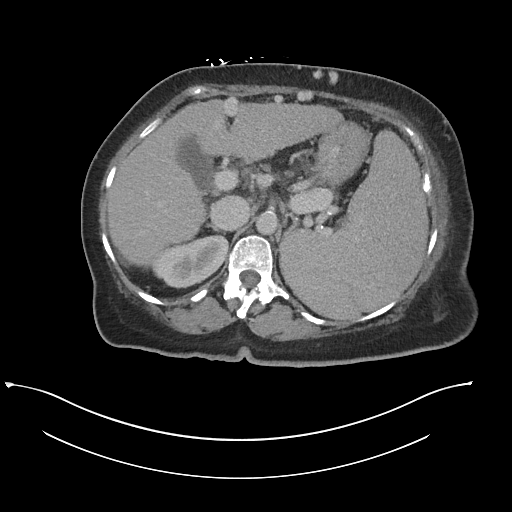
[im 67/93  bone]
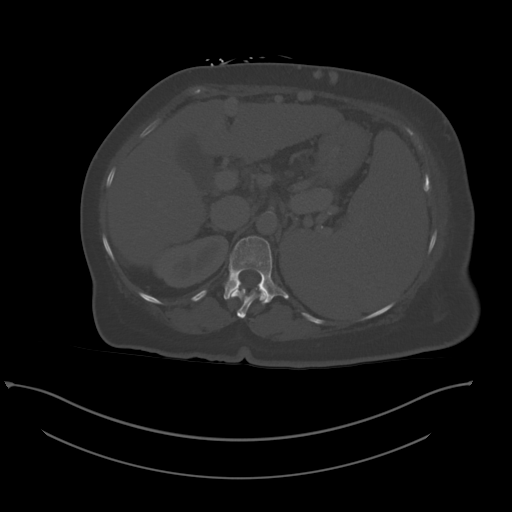
[im 72/93  soft-tissue]
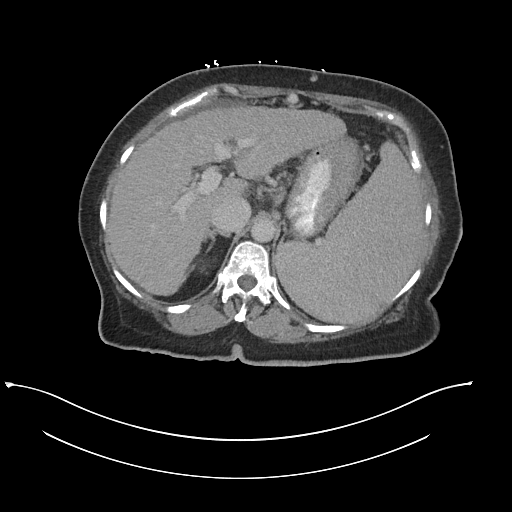
[im 77/93  soft-tissue]
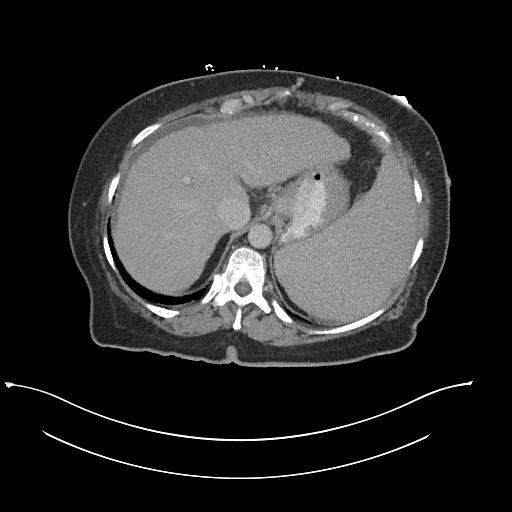
[im 87/93  soft-tissue]
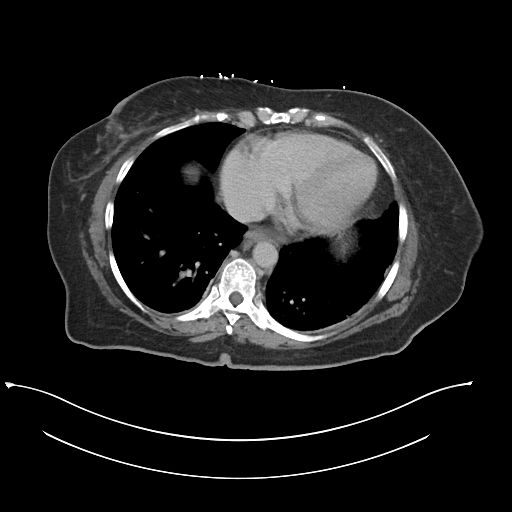

[Series 6: coronal soft tissue · coronal · 0.87mm/px · 3 of 98 slices shown]
[im 33/98  soft-tissue]
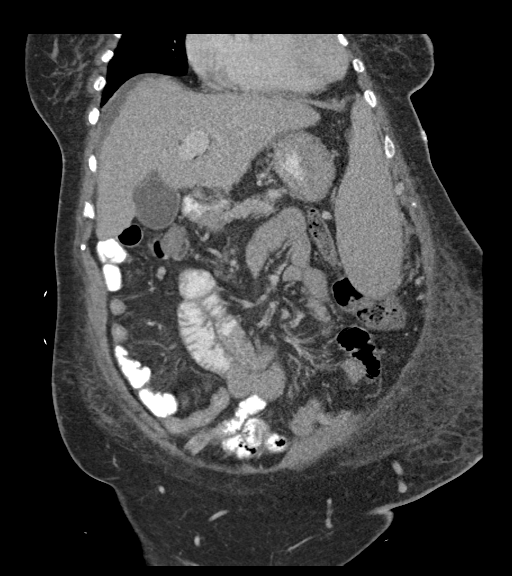
[im 44/98  soft-tissue]
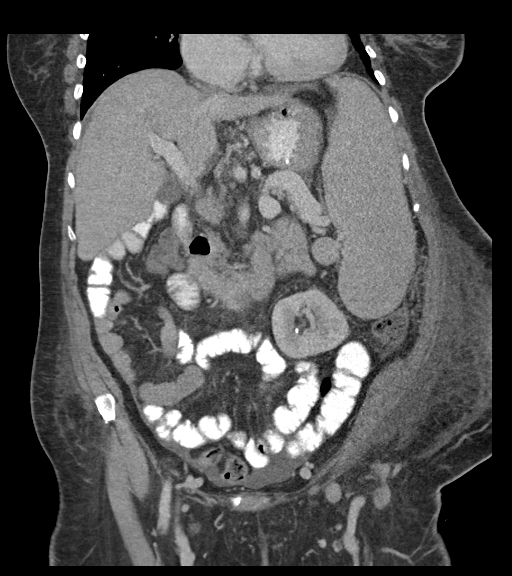
[im 54/98  soft-tissue]
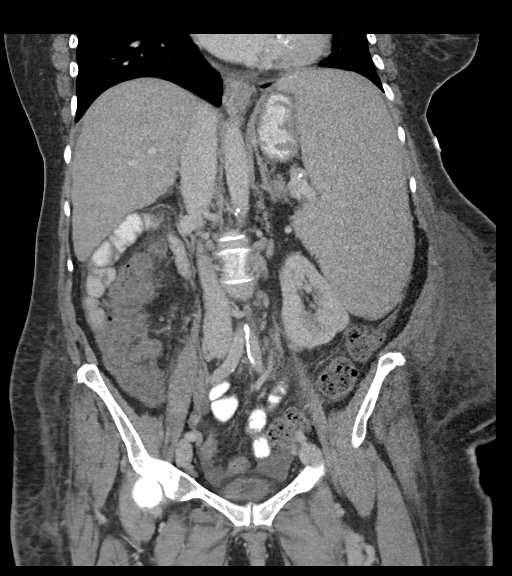

[15 of 46 positions shown; findings below may reference images not displayed]

FINDINGS: Lower chest: Heart is enlarged in size. Small linear densities seen
in the left posterior costophrenic angle suggesting minimal scarring
or subsegmental atelectasis. There is interval decrease in left
pleural effusion.

Hepatobiliary: Liver measures 17.3 cm in length. There is nodularity
in the liver surface. No definite focal abnormality is seen in the
liver. There is no dilation of bile ducts. There is small calcified
gallbladder stone. There is no wall thickening in gallbladder.

Pancreas: There is atrophy.

Spleen: Spleen measures 17.7 cm in maximum diameter. Splenule is
seen along the medial margin of spleen. There are tortuous vessels
in the splenic hilum, possibly suggesting portal hypertension.

Adrenals/Urinary Tract: Adrenals are not enlarged. There is no
hydronephrosis. There are 2 small calculi in the lower pole of left
kidney. There are no demonstrable ureteral stones. Urinary bladder
is not distended.

Stomach/Bowel: Stomach is unremarkable. Small bowel loops are not
dilated. Appendix is not distinctly seen. There is no pericecal
inflammation. There is no significant wall thickening in colon.

Vascular/Lymphatic: There is recanalization of umbilical vein. There
are tortuous vessels in the upper abdomen and anterior abdominal
wall suggesting portosystemic shunt due to portal hypertension.
There are scattered atherosclerotic plaques and calcifications in
the aorta and its major branches. There are slightly enlarged lymph
nodes in the retroperitoneum with no significant change.

Reproductive: Uterus is not seen.  There are no adnexal masses.

Other: Small ascites is present. There is no pneumoperitoneum. There
is diffuse edema in the abdominal wall, especially in the left mid
and lower abdomen. Umbilical hernia containing fat is seen. There is
6.2 cm loculated fluid collection in the left paraumbilical region
with no significant change. There are no pockets of air within this
fluid collection.

Musculoskeletal: Degenerative changes are noted in the lower lumbar
spine with encroachment of neural foramina and mild spinal stenosis.
IMPRESSION: There is no evidence of intestinal obstruction or pneumoperitoneum.
There is no hydronephrosis.

Cirrhosis of liver. Small ascites. Portal hypertension.
Splenomegaly. Gallbladder stone. Small left renal stones.

There is 6.2 cm loculated fluid collection in the left paraumbilical
region with no significant interval change. Differential diagnostic
possibilities would include entrapment of ascitic fluid in the
paraumbilical hernia or resolving hematoma or an abscess. There is
edema in subcutaneous plane, especially in the left lower and left
mid abdomen suggesting contusion or cellulitis. There is slight
worsening of this finding.

## 2022-06-30 IMAGING — US IR US GUIDANCE
1 series · 6 of 6 positions shown · non-contrast
Comparison: none

INDICATION: 74-year-old female with periumbilical fluid collection with
surrounding cellulitis.

[Series 1: ir us guidance · 6 of 6 slices shown]
[im 1/6]
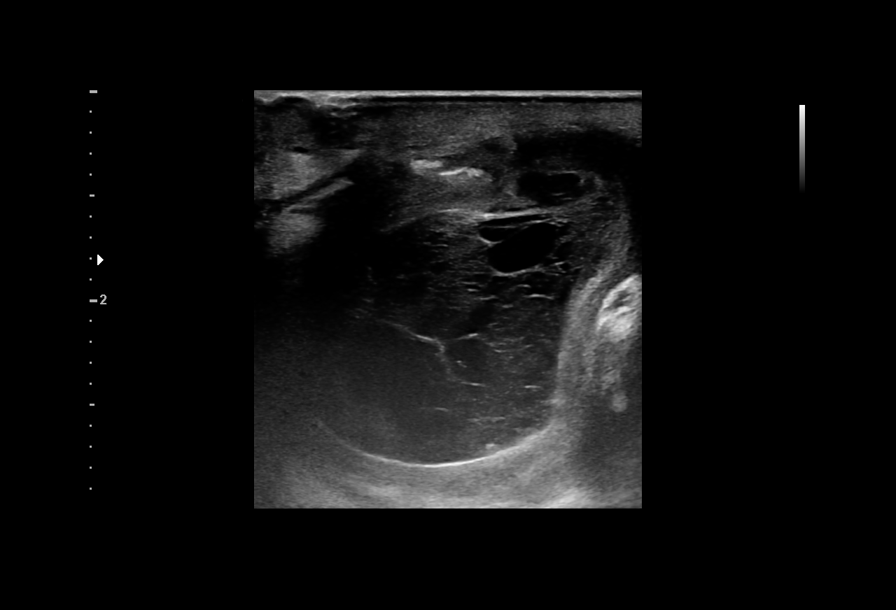
[im 2/6]
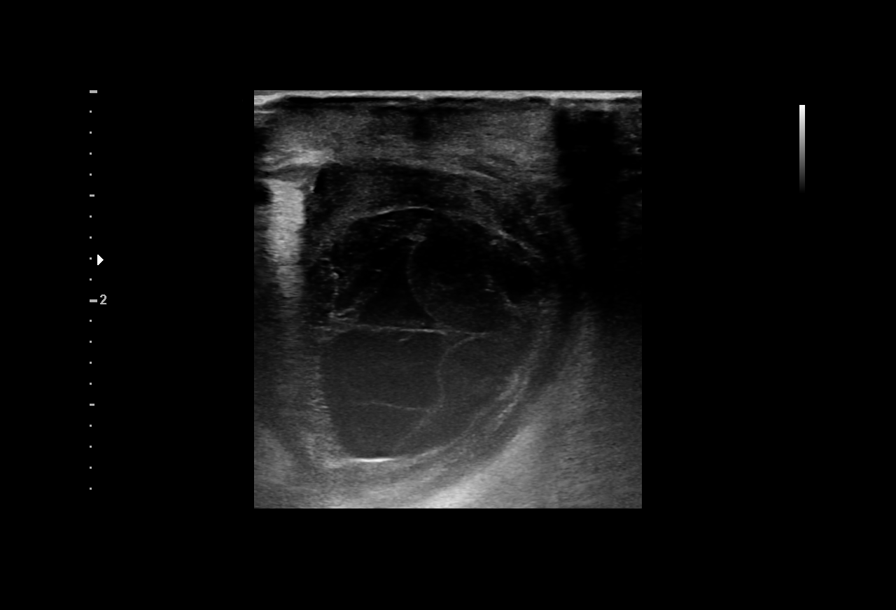
[im 3/6]
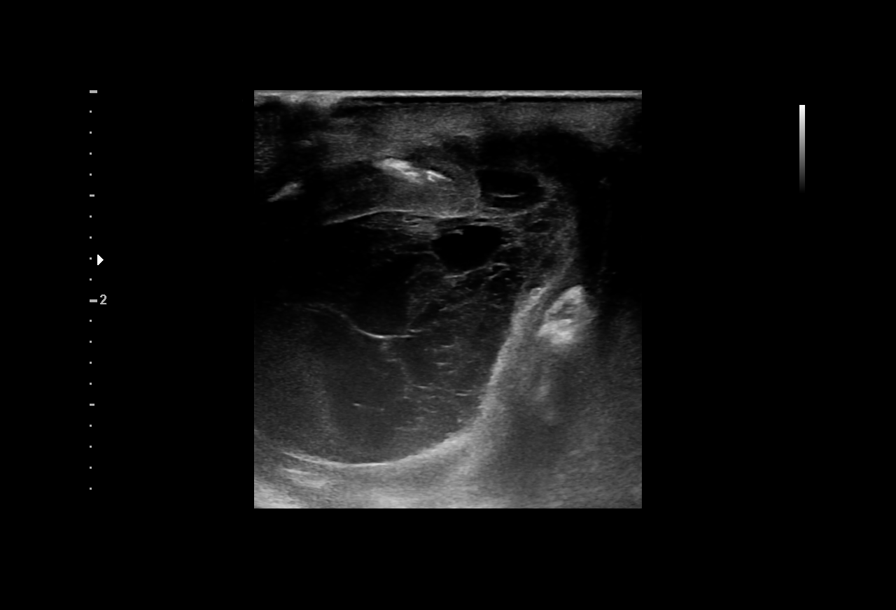
[im 4/6]
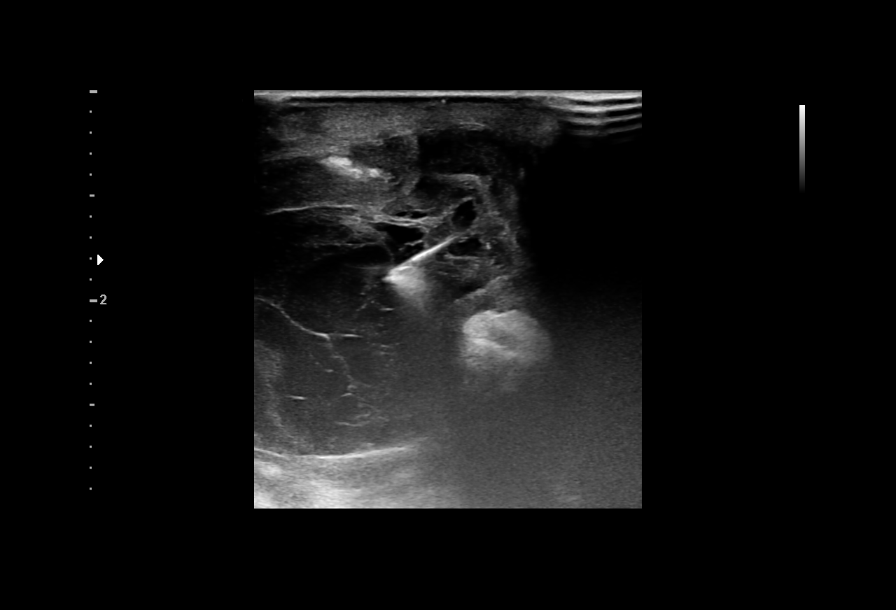
[im 5/6]
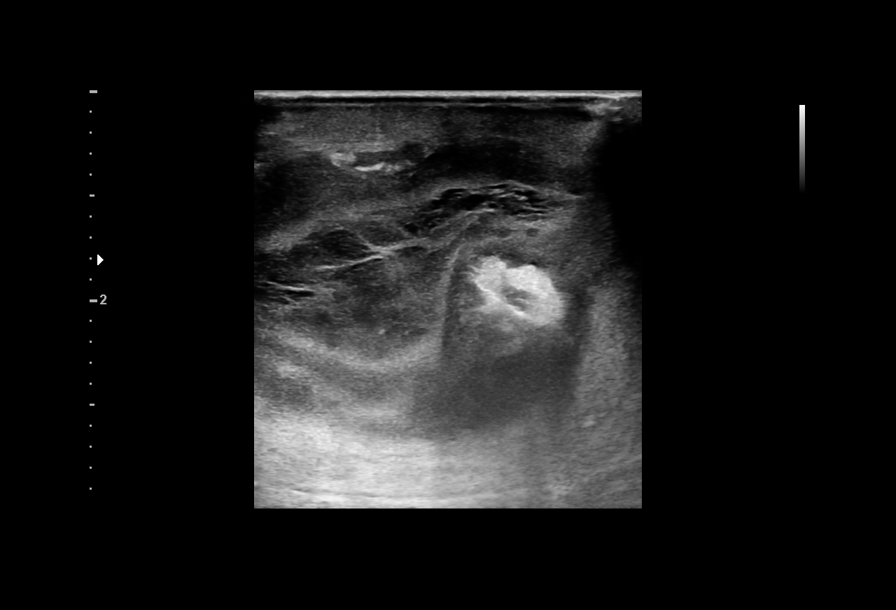
[im 6/6]
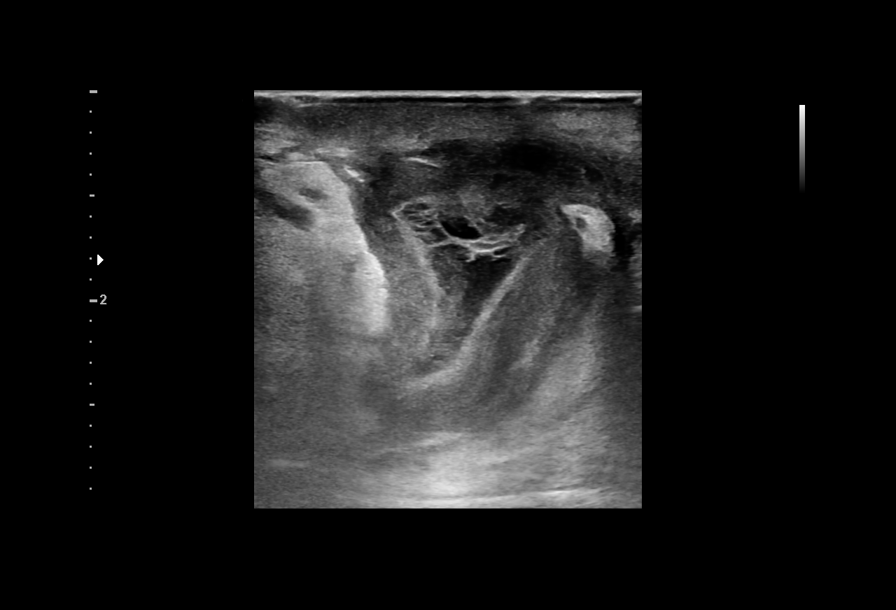

[6 of 6 positions shown; findings below may reference images not displayed]

EXAM:
Ultrasound-guided aspiration

MEDICATIONS:
The patient is currently admitted to the hospital and receiving
intravenous antibiotics. The antibiotics were administered within an
appropriate time frame prior to the initiation of the procedure.

ANESTHESIA/SEDATION:
None.

COMPLICATIONS:
None immediate.

PROCEDURE:
Informed written consent was obtained from the patient after a
thorough discussion of the procedural risks, benefits and
alternatives. All questions were addressed. Maximal Sterile Barrier
Technique was utilized including caps, mask, sterile gowns, sterile
gloves, sterile drape, hand hygiene and skin antiseptic. A timeout
was performed prior to the initiation of the procedure.

Preprocedure ultrasound evaluation demonstrated a moderately complex
subcutaneous fluid collection about the left aspect of the umbilicus
measuring up to approximately 2.7 x 2.7 x 2.2 cm. Procedure was
planned. The periumbilical region was prepped and draped in standard
fashion. Subdermal Local anesthesia was provided at the planned
needle entry site. Under direct ultrasound visualization, a 7 cm
Yueh catheter was advanced into the deep portion of the fluid
collection. Approximately 10 mL of purulence fluid was aspirated. A
sample was sent for culture. There is significantly decreased size
of the complex collection with overall resolution of the simple
fluid components. The catheter was removed. Hemostasis was achieved
with brief manual compression. A sterile bandage was applied. The
patient tolerated procedure well without immediate complication.
IMPRESSION: Technically successful ultrasound-guided aspiration of complex left
periumbilical fluid collection yielding 10 mL of purulent fluid. A
sample was sent to the lab for culture.

## 2022-07-13 ENCOUNTER — Ambulatory Visit (HOSPITAL_COMMUNITY)
Admission: RE | Admit: 2022-07-13 | Discharge: 2022-07-13 | Disposition: A | Discharge status: Home / Self Care | Payer: Medicare Other | Source: Ambulatory Visit | Attending: Internal Medicine | Admitting: Internal Medicine

## 2022-07-13 DIAGNOSIS — K7290 Hepatic failure, unspecified without coma: Secondary | ICD-10-CM

## 2022-07-13 DIAGNOSIS — K7460 Unspecified cirrhosis of liver: Secondary | ICD-10-CM | POA: Insufficient documentation

## 2022-07-17 ENCOUNTER — Other Ambulatory Visit: Payer: Self-pay | Admitting: Internal Medicine

## 2022-07-17 DIAGNOSIS — K7460 Unspecified cirrhosis of liver: Secondary | ICD-10-CM

## 2022-07-22 LAB — LAB REPORT - SCANNED: POC INR: 1.4

## 2022-08-01 ENCOUNTER — Other Ambulatory Visit: Payer: Self-pay | Admitting: Cardiology

## 2022-08-04 ENCOUNTER — Ambulatory Visit (INDEPENDENT_AMBULATORY_CARE_PROVIDER_SITE_OTHER): Payer: Medicare Other | Admitting: Internal Medicine

## 2022-08-04 ENCOUNTER — Encounter: Payer: Self-pay | Admitting: Internal Medicine

## 2022-08-04 VITALS — BP 124/60 | HR 88 | Ht 65.0 in | Wt 189.4 lb

## 2022-08-04 DIAGNOSIS — K7581 Nonalcoholic steatohepatitis (NASH): Secondary | ICD-10-CM | POA: Diagnosis not present

## 2022-08-04 DIAGNOSIS — K766 Portal hypertension: Secondary | ICD-10-CM

## 2022-08-04 DIAGNOSIS — K7460 Unspecified cirrhosis of liver: Secondary | ICD-10-CM

## 2022-08-04 DIAGNOSIS — K7469 Other cirrhosis of liver: Secondary | ICD-10-CM

## 2022-08-04 DIAGNOSIS — I8510 Secondary esophageal varices without bleeding: Secondary | ICD-10-CM | POA: Diagnosis not present

## 2022-08-04 DIAGNOSIS — K3189 Other diseases of stomach and duodenum: Secondary | ICD-10-CM

## 2022-08-04 DIAGNOSIS — K429 Umbilical hernia without obstruction or gangrene: Secondary | ICD-10-CM

## 2022-08-04 MED ORDER — SPIRONOLACTONE 50 MG PO TABS: 150.0000 mg | ORAL_TABLET | Freq: Every day | ORAL | 3 refills | Status: DC

## 2022-08-04 MED ORDER — FUROSEMIDE 20 MG PO TABS: 60.0000 mg | ORAL_TABLET | Freq: Every day | ORAL | 3 refills | Status: DC

## 2022-08-04 NOTE — Progress Notes (Signed)
Savannah Hobbs 77 y.o. [birth year] [id number]  Assessment & Plan:   Encounter Diagnoses  Name Primary?   Liver cirrhosis secondary to NASH (HCC) Yes   Portal hypertensive gastropathy and coloapthy (HCC)    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HCC)    Periumbilical hernia     She has edema and I wonder if she might not have some ascites (ultrasound was right upper quadrant).  Will increase diuretics as below (go from 40 mg furosemide and 100 mg spironolactone).  Be met in 1 month in [place] fax records to me.  Follow-up here in 3 months.  At which point we will consider the possibility of adding low-dose carvedilol.  Otherwise she will need an EGD in the fall to follow-up on her varices.  Routine 50-month ultrasound in October.  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  Continue to monitor hernia.  She is a high risk surgical candidate and would not operate electively.  Control volume status and ascites.  I think she is a Childs B at 7 points (suspicion of some ascites) if no ascites she will be a [name] a at 5 points.   Meds ordered this encounter  Medications   furosemide (LASIX) 20 MG tablet    Sig: Take 3 tablets (60 mg total) by mouth daily.    Dispense:  90 tablet    Refill:  3   spironolactone (ALDACTONE) 50 MG tablet    Sig: Take 3 tablets (150 mg total) by mouth daily.    Dispense:  90 tablet    Refill:  3     Subjective:   Chief Complaint: Follow-up cirrhosis  HPI 77 year old white woman with a history of Savannah Hobbs now MASH cirrhosis and a history of colon polyps who had decompensation last year, and returns for routine follow-up with her husband.  She is not having any significant problems, she does complain of ankle and lower extremity edema.  US Abdomen Limited RUQ (LIVER/GB) CLINICAL DATA:  Cirrhosis.  EXAM: ULTRASOUND ABDOMEN LIMITED RIGHT UPPER QUADRANT  COMPARISON:  Abdominal  ultrasound dated 04/10/2021.  FINDINGS: Gallbladder:  A gallstone in the gallbladder neck measures 10 mm. No gallbladder wall thickening visualized. No sonographic Murphy sign noted by sonographer.  Common bile duct:  Diameter: 3 mm  Liver:  No focal lesion identified. The liver has a nodular surface contour, consistent with hepatic cirrhosis. Within normal limits in parenchymal echogenicity. Portal vein is patent on color Doppler imaging with normal direction of blood flow towards the liver.  Other: None.  IMPRESSION: 1. Cholelithiasis without evidence of acute cholecystitis. 2. Hepatic cirrhosis. No focal liver lesions.  Electronically Signed   By: Romona Curls M.D.   On: 07/13/2022 12:15  Recent labs on 07/22/2022 white count 18,000 which is a chronic issue, platelets normal at 188 hemoglobin 12.7.  INR was 1.4.  BUN 16 creatinine 0.56.  Sodium 137 potassium 4.1 chloride 106 CO2 28.  Albumin 3.2 total bilirubin 1.2 AST 46 ALT 28 alk phos 100.    Wt Readings from Last 3 Encounters:  08/04/22 189 lb 6.4 oz (85.9 kg)  01/23/22 183 lb (83 kg)  12/23/21 182 lb (82.6 kg)  Colonoscopy 11/27/2021 Impression:               - Eleven (11) diminutive polyps in the rectum, in  the sigmoid colon, in the descending colon, in the                            transverse colon and in the cecum, removed with a                            cold snare. Resected and retrieved.  Pathology was a mix of hyperplastic SSP and adenoma and plan for recall in 2026 depending upon health status                           - Diverticulosis in the sigmoid colon.                           - External and internal hemorrhoids.                           - Altered vascular, congested and erythematous                            mucosa in the entire examined colon. 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                           - The examination was otherwise  normal on direct                            and retroflexion views.                           - Personal history of colonic polyps.  EGD 11/27/2021 Impression:               - Grade I and small (< 5 mm) esophageal varices.                           - Portal hypertensive gastropathy.                           - Erythematous mucosa in the antrum. Biopsied.                            Gastritis vs portal gastropathy                           - Normal examined duodenum. No Known Allergies Current Outpatient Medications  Medication Instructions   acetaminophen (TYLENOL) 500 mg, Oral, Every 6 hours PRN   apixaban (ELIQUIS) 5 mg, Oral, 2 times daily   cetirizine (ZYRTEC) 10 mg, Oral, Every morning   cyanocobalamin (VITAMIN B12) 1,000 mcg, Oral, Daily   diltiazem (CARDIZEM CD) 120 mg, Oral, Daily   docusate sodium (COLACE) 100 mg, Oral, 2 times daily   esomeprazole (NEXIUM) 20 mg, Oral, Daily before breakfast   furosemide (LASIX) 40 mg, Oral, Daily   spironolactone (ALDACTONE) 100 mg, Oral,  Daily   vitamin C 1,000 mg, Oral, Daily    Past Medical History:  Diagnosis Date   Abdominal wall cellulitis 01/18/2021   Allergy    Arthritis    Asthma    Cirrhosis of liver (HCC)    NASH   Erosive esophagitis 1995   Fatty liver    GERD (gastroesophageal reflux disease)    Hiatal hernia    LARGE   Hx of colonic polyps 05/27/2018   Hx of transfusion of platelets    Hyperlipidemia    Hypertension    Myelodysplasia-Myeloproliferative disease (HCC)    Paroxysmal atrial fibrillation (HCC)    Portal hypertensive gastropathy and coloapthy (HCC) 11/27/2021   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HCC) 11/27/2021   Thrombocytopenia (HCC)    Past Surgical History:  Procedure Laterality Date   ABDOMINAL HYSTERECTOMY     partial   APPENDECTOMY     ATRIAL FIBRILLATION ABLATION N/A 09/26/2020   Procedure: ATRIAL FIBRILLATION ABLATION;  Surgeon: Hillis Range, MD;  Location: [facility];   Service: Cardiovascular;  Laterality: N/A;   COLONOSCOPY     ESOPHAGOGASTRODUODENOSCOPY     GYNECOLOGIC CRYOSURGERY     x2   IR US GUIDE BX ASP/DRAIN  01/29/2021   REPLACEMENT TOTAL KNEE Left 2016   TONSILLECTOMY     TOTAL KNEE ARTHROPLASTY Right    TUBAL LIGATION     UPPER GASTROINTESTINAL ENDOSCOPY     Social History   Social History Narrative   Married, retired Engineer, 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   3 sons, [id number] son died in his sleep unclear cause   Right handed    Some caffeine    family history includes Esophageal cancer in her father; Hypertension in her mother.   Review of Systems  See HPI Objective:   Physical Exam BP 124/60   Pulse 88   Ht 5\' 5"  (1.651 m)   Wt 189 lb 6.4 oz (85.9 kg)   BMI 31.52 kg/m  Elderly NAD Lungs cta Cor NL S1s no rmg Abd obese and ? Some ascites and soft sl indurated umbil hernia Ext 1 + ankle and low pre-tibial eema Alert and orinted x 3

## 2022-08-04 NOTE — Patient Instructions (Addendum)
Please have blood work drawn in a month and results faxed to [place]. Order given to you today.  Dr Leone Payor has made changes to your meds: spironolactone, and furosemide. See list.   We will contact closer to October to set up a RUQ U/S.   _______________________________________________________  If your blood pressure at your visit was 140/90 or greater, please contact your primary care physician to follow up on this.  _______________________________________________________  If you are age 75 or older, your body mass index should be between 23-30. Your Body mass index is 31.52 kg/m. If this is out of the aforementioned range listed, please consider follow up with your Primary Care Provider.  If you are age 28 or younger, your body mass index should be between 19-25. Your Body mass index is 31.52 kg/m. If this is out of the aformentioned range listed, please consider follow up with your Primary Care Provider.   ________________________________________________________  The [facility] providers would like to encourage you to use [facility] to communicate with providers for non-urgent requests or questions.  Due to long hold times on the telephone, sending your provider a message by [facility] may be a faster and more efficient way to get a response.  Please allow 48 business hours for a response.  Please remember that this is for non-urgent requests.  _______________________________________________________  I appreciate the opportunity to care for you. Stan Head, MD, [facility]

## 2022-08-12 ENCOUNTER — Encounter: Payer: Self-pay | Admitting: Internal Medicine

## 2022-08-28 ENCOUNTER — Telehealth: Payer: Self-pay | Admitting: Internal Medicine

## 2022-08-28 NOTE — Telephone Encounter (Signed)
Pt stated that she is having bilateral leg cramps and requesting potassium. Pt stated that she is eating a banana a day but its not helping.   Pt stated that she noticed an increase in her leg cramps after starting the Spironolactone 150 mg and lasix 60 mg on 08/04/2022: Please advise

## 2022-08-28 NOTE — Telephone Encounter (Signed)
PT was given an increase on her fluid pills and now she is getting a lot of cramps. She is asking to have potassium RX sent to the [facility] on [address]. Please advise.

## 2022-08-28 NOTE — Telephone Encounter (Signed)
Pt was made aware of Dr. Leone Payor recommendations: Pt requested to have labs done at [facility] [address] [place] [address] [phone]. Lab closed. Will fax orders on Monday. Pt made aware to  Pt made aware to she can go back to old doses of diuretics 40 mg furosemide and 100 mg spironolactone daily  Pt verbalized understanding with all questions answered.

## 2022-08-28 NOTE — Telephone Encounter (Signed)
She has to get a BMET and magnesium level before I can add K See if she can do that in [place] and fax to [place]   + she can go back to old doses of diuretics 40 mg furosemide and 100 mg spironolactone daily  Dx cirrhosis

## 2022-08-31 NOTE — Telephone Encounter (Signed)
[facility] called and received fax Number to have labs drawn. Orders for labs faxed to [phone]. Pt was left a detailed voice message notifying that the orders have been faxed to [facility]. Pt has address and phone number.

## 2022-09-07 ENCOUNTER — Telehealth: Payer: Self-pay | Admitting: Internal Medicine

## 2022-09-07 LAB — LAB REPORT - SCANNED: EGFR: 85

## 2022-09-07 NOTE — Telephone Encounter (Signed)
Savannah Hobbs with [facility] states that medicare will not cover magnesium for the diagnosis codes we have sent. It appears medicare may cover magnesium testing for patients with diuretic therapy or cardiac arrythmias. Patient is on furosemide and does have atrial fibrillation. Therefore, I have provided Savannah Hobbs with diagnosis codes [phone] and I49.9.

## 2022-09-07 NOTE — Telephone Encounter (Signed)
Inbound call from [place] from [facility] requesting patients diagnoses codes for labs that she has. I gave her K75.81, I85.10 and K76.6. She stated that none of those worked and is requesting a call back to discuss. Please advise.

## 2022-09-16 NOTE — Telephone Encounter (Signed)
Inbound call from patient inquiring if we have received her labs from [facility]. Please advise.

## 2022-09-17 NOTE — Telephone Encounter (Signed)
Confirmed with Clayborne Dana, CMA that lab work was received & placed on Dr. Marvell Fuller desk. Spoke with patient's husband & advised him that we did receive results, and would contact them again with MD recommendations once reviewed. He verbalized all understanding.

## 2022-09-18 ENCOUNTER — Telehealth: Payer: Self-pay | Admitting: Internal Medicine

## 2022-09-18 NOTE — Telephone Encounter (Signed)
Patient was  called to notify MD's orders. Patient states the aldactone and lasix had been reduced due to complaints of cramping. These symptoms were reported to Dr. Leone Payor, prior to reduction in medication dosage. Patient continues to take Aldactone 100mg  and Lasix 40mg , although patient does state she takes an extra 20mg  in the evening when swelling is noted. Patient states she is not a diabetic and the day of her labs; she did have a extremely sweet fruit juice with a Ruben sandwich 1 hour prior to labs being taken. Patient still plans to keep her appt on 11/26/22 at 2:30 .

## 2022-09-18 NOTE — Telephone Encounter (Signed)
Let patient know that labs show she is tolerating the increased diuretics fine  Stay on those doses of spironolactone and furosemide  Her blood sugar was 193 -very high raises ? Of diabetes  Reduce sweets/sugary beverages and starchy foods   See me as planned 11/2022

## 2022-10-08 ENCOUNTER — Other Ambulatory Visit: Payer: Self-pay | Admitting: Cardiology

## 2022-10-08 DIAGNOSIS — I480 Paroxysmal atrial fibrillation: Secondary | ICD-10-CM

## 2022-10-15 ENCOUNTER — Ambulatory Visit: Payer: Medicare Other | Attending: Cardiology | Admitting: Cardiology

## 2022-10-15 VITALS — HR 89 | Ht 65.0 in | Wt 191.2 lb

## 2022-10-15 DIAGNOSIS — Z79899 Other long term (current) drug therapy: Secondary | ICD-10-CM | POA: Diagnosis present

## 2022-10-15 DIAGNOSIS — I10 Essential (primary) hypertension: Secondary | ICD-10-CM | POA: Diagnosis present

## 2022-10-15 DIAGNOSIS — I4892 Unspecified atrial flutter: Secondary | ICD-10-CM | POA: Diagnosis not present

## 2022-10-15 DIAGNOSIS — I350 Nonrheumatic aortic (valve) stenosis: Secondary | ICD-10-CM | POA: Insufficient documentation

## 2022-10-15 DIAGNOSIS — I6529 Occlusion and stenosis of unspecified carotid artery: Secondary | ICD-10-CM | POA: Diagnosis present

## 2022-10-15 DIAGNOSIS — I480 Paroxysmal atrial fibrillation: Secondary | ICD-10-CM | POA: Insufficient documentation

## 2022-10-15 DIAGNOSIS — E7800 Pure hypercholesterolemia, unspecified: Secondary | ICD-10-CM | POA: Insufficient documentation

## 2022-10-15 MED ORDER — APIXABAN 5 MG PO TABS: 5.0000 mg | ORAL_TABLET | Freq: Two times a day (BID) | ORAL | 3 refills | Status: DC

## 2022-10-15 NOTE — Patient Instructions (Signed)
Medication Instructions:  No changes *If you need a refill on your cardiac medications before your next appointment, please call your pharmacy*   Lab Work: CBC, CMP, Mag, Lipid panel- today If you have labs (blood work) drawn today and your tests are completely normal, you will receive your results only by: MyChart Message (if you have MyChart) OR A paper copy in the mail If you have any lab test that is abnormal or we need to change your treatment, we will call you to review the results.   Testing/Procedures: Your physician has requested that you have an echocardiogram. Echocardiography is a painless test that uses sound waves to create images of your heart. It provides your doctor with information about the size and shape of your heart and how well your heart's chambers and valves are working. This procedure takes approximately one hour. There are no restrictions for this procedure. Please do NOT wear cologne, perfume, aftershave, or lotions (deodorant is allowed). Please arrive 15 minutes prior to your appointment time.   Your physician has requested that you have a carotid duplex. This test is an ultrasound of the carotid arteries in your neck. It looks at blood flow through these arteries that supply the brain with blood. Allow one hour for this exam. There are no restrictions or special instructions.     Follow-Up: At [facility], you and your health needs are our priority.  As part of our continuing mission to provide you with exceptional heart care, we have created designated Provider Care Teams.  These Care Teams include your primary Cardiologist (physician) and Advanced Practice Providers (APPs -  Physician Assistants and Nurse Practitioners) who all work together to provide you with the care you need, when you need it.  We recommend signing up for the patient portal called "MyChart".  Sign up information is provided on this After Visit Summary.  MyChart is used to connect  with patients for Virtual Visits (Telemedicine).  Patients are able to view lab/test results, encounter notes, upcoming appointments, etc.  Non-urgent messages can be sent to your provider as well.   To learn more about what you can do with MyChart, go to [email or web address].    Your next appointment:   4 month(s)  Provider:   Little Ishikawa, MD

## 2022-10-15 NOTE — Progress Notes (Signed)
Cardiology Office Note:    Date:  10/15/2022   ID:  Savannah Hobbs, Savannah Hobbs 1945/10/05, MRN [id number]  PCP:  Romeo Rabon, MD  Cardiologist:  Little Ishikawa, MD  Electrophysiologist:  Hillis Range, MD (Inactive)   Referring MD: Romeo Rabon, MD   No chief complaint on file.   History of Present Illness:    Savannah Hobbs is a 77 y.o. female with a hx of paroxysmal atrial fibrillation, cirrhosis, hyperlipidemia who presents for follow-up.  She was diagnosed with COVID-19 in October 2020.  Had an episode of atrial fibrillation while recovering from this.  In January 2021 she had a second episode of atrial fibrillation that occurred after she received her first dose of the Covid vaccine.  She initially followed with cardiologist in [place] and was started on metoprolol.  She was not started on anticoagulation.  She presented to [facility] on 01/22/2020 with A. fib with RVR.  Echocardiogram showed normal biventricular function, mild MR, mild AS.  She was started on diltiazem drip for rate control and converted to normal sinus rhythm.  She was switched to p.o. diltiazem.  Eliquis 5 mg twice daily was started given CHA2DS2-VASc 4.  She reported having chest pain and a Lexiscan Myoview was done on 02/02/2020, which showed normal perfusion, EF 68%.  She saw Dr. Johney Frame, underwent ablation on 09/26/2020 for A. fib/flutter.  She was admitted from 10/28 through 02/02/2021 with abdominal wall cellulitis and a ventral wall umbilical hernia and periumbilical abscess.  She completed a course of antibiotics, was felt to be high risk for surgery due to underlying cirrhosis.  She was discharged on 100 mg of spironolactone/40 mg Lasix for her cirrhosis.  Since last clinic appointment, she reports she has had some dizzy spells where she is felt unsteady on her feet.  Checked her blood pressure and it was okay.  She denies any syncopal episodes.  Denies any palpitations, chest pain, or dyspnea.   Reports chronic lower extremity edema.  States the dizzy spells have been occurring off and on for past 2 months.  Reports occasional nosebleeds on Eliquis.  States that she has had 3 falls in the past year.  No syncope with falls but reports she would lose her balance.     Wt Readings from Last 3 Encounters:  10/15/22 191 lb 3.2 oz (86.7 kg)  08/04/22 189 lb 6.4 oz (85.9 kg)  01/23/22 183 lb (83 kg)     Past Medical History:  Diagnosis Date   Abdominal wall cellulitis 01/18/2021   Allergy    Arthritis    Asthma    Cirrhosis of liver (HCC)    NASH   Erosive esophagitis 1995   Fatty liver    GERD (gastroesophageal reflux disease)    Hiatal hernia    LARGE   Hx of colonic polyps 05/27/2018   Hx of transfusion of platelets    Hyperlipidemia    Hypertension    Myelodysplasia-Myeloproliferative disease (HCC)    Paroxysmal atrial fibrillation (HCC)    Portal hypertensive gastropathy and coloapthy (HCC) 11/27/2021   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HCC) 11/27/2021   Thrombocytopenia (HCC)     Past Surgical History:  Procedure Laterality Date   ABDOMINAL HYSTERECTOMY     partial   APPENDECTOMY     ATRIAL FIBRILLATION ABLATION N/A 09/26/2020   Procedure: ATRIAL FIBRILLATION ABLATION;  Surgeon: Hillis Range, MD;  Location: [facility];  Service: Cardiovascular;  Laterality: N/A;   COLONOSCOPY  ESOPHAGOGASTRODUODENOSCOPY     GYNECOLOGIC CRYOSURGERY     x2   IR US GUIDE BX ASP/DRAIN  01/29/2021   REPLACEMENT TOTAL KNEE Left 2014/10/27   TONSILLECTOMY     TOTAL KNEE ARTHROPLASTY Right    TUBAL LIGATION     UPPER GASTROINTESTINAL ENDOSCOPY      Current Medications: Current Meds  Medication Sig   acetaminophen (TYLENOL) 500 MG tablet Take 500 mg by mouth every 6 (six) hours as needed for moderate pain.   Ascorbic Acid (VITAMIN C) 1000 MG tablet Take 1,000 mg by mouth daily.   cetirizine (ZYRTEC) 10 MG tablet Take 10 mg by mouth in the morning.   diltiazem  (CARDIZEM CD) 120 MG 24 hr capsule TAKE 1 CAPSULE BY MOUTH EVERY DAY   docusate sodium (COLACE) 100 MG capsule Take 100 mg by mouth 2 (two) times daily.   esomeprazole (NEXIUM) 20 MG capsule Take 20 mg by mouth daily before breakfast.   furosemide (LASIX) 20 MG tablet Take 3 tablets (60 mg total) by mouth daily. (Patient taking differently: Take 40 mg by mouth daily.)   spironolactone (ALDACTONE) 100 MG tablet Take 1 tablet by mouth daily.   [DISCONTINUED] apixaban (ELIQUIS) 5 MG TABS tablet Take 1 tablet (5 mg total) by mouth 2 (two) times daily.     Allergies:   Patient has no known allergies.   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   3 sons, Aug 06, 20171 son died in his sleep unclear cause   Right handed    Some caffeine    Social Determinants of Health   Financial Resource Strain: Not on file  Food Insecurity: Not on file  Transportation Needs: Not on file  Physical Activity: Not on file  Stress: Not on file  Social Connections: Not on file     Family History: The patient's family history includes Esophageal cancer in her father; Hypertension in her mother. There is no history of Colon polyps, Colon cancer, Rectal cancer, or Stomach cancer.  ROS:   Please see the history of present illness.     All other systems reviewed and are negative.  EKGs/Labs/Other Studies Reviewed:     The following studies were reviewed today:   EKG:   10/15/22: Normal sinus rhythm with PACs  Recent Labs: 01/24/2022: ALT 15 01/25/2022: BUN 10; Creatinine, Ser 0.49; Hemoglobin 11.7; Platelets 211; Potassium 4.0; Sodium 132  Recent Lipid Panel    Component Value Date/Time   CHOL 160 03/25/2021 1614   TRIG 94 03/25/2021 1614   HDL 61 03/25/2021 1614   CHOLHDL 2.6 03/25/2021 1614   CHOLHDL 2.8 01/23/2020 0222   VLDL 28 01/23/2020 0222   LDLCALC 82 03/25/2021 1614    Physical Exam:    VS:  Pulse 89   Ht 5'  5" (1.651 m)   Wt 191 lb 3.2 oz (86.7 kg)   SpO2 98%   BMI 31.82 kg/m     Wt Readings from Last 3 Encounters:  10/15/22 191 lb 3.2 oz (86.7 kg)  08/04/22 189 lb 6.4 oz (85.9 kg)  01/23/22 183 lb (83 kg)     GEN: in no acute distress HEENT: Normal NECK: No JVD CARDIAC: RRR, 2 out of 6 systolic murmur RESPIRATORY:  Clear to auscultation without rales, wheezing or rhonchi  ABDOMEN: Soft, non-tender, non-distended MUSCULOSKELETAL: 2+ bilateral lower extremity edema SKIN: Warm and dry NEUROLOGIC:  Alert and oriented x 3 PSYCHIATRIC:  Normal affect   ASSESSMENT:    1. PAF (paroxysmal atrial fibrillation) (HCC)   2. Nonrheumatic aortic valve stenosis   3. Stenosis of carotid artery, unspecified laterality   4. Medication management   5. Hypercholesterolemia   6. Essential hypertension     PLAN:    Paroxysmal atrial fibrillation: CHA2DS2-VASc score 3 (hypertension, age, female).  Lexiscan Myoview was done on 02/02/2020, which showed normal perfusion, EF 68%.  Underwent ablation on 09/26/2020 for A. fib/flutter with Dr Johney Frame. -Continue Eliquis 5 mg twice daily -Continue diltiazem 120 mg daily -She reports recent falls, has had 3 falls in last year.  May not be a good candidate for anticoagulation moving forward.  Will refer to Dr. Lalla Brothers for [facility] evaluation  Chest pain: Atypical in description. Lexiscan Myoview was done on 02/02/2020, which showed normal  perfusion, EF 68%.  No further work-up recommended  Aortic stenosis: Mild on echocardiogram on 01/23/2020.  Repeat echocardiogram to monitor  Hypertension: Appears controlled, continue diltiazem  Cirrhosis/Chronic diastolic heart failure: On 100 mg spironolactone/40 mg Lasix.  Appears euvolemic.  Will check CMP  Hyperlipidemia: On atorvastatin 40 mg daily.  Calcium score 206 (78th percentile on 09/19/2020).  LDL 53 on 01/23/2020.  We will repeat lipid panel and check LFTs given on statin with known cirrhosis  Carotid stenosis: Reported was found to have carotid stenosis on duplex at [facility], will check carotid arteries  ?TIA: Admitted to [facility] with possible TIA.  Neurology following   RTC in 4 months   Medication Adjustments/Labs and Tests Ordered: Current medicines are reviewed at length with the patient today.  Concerns regarding medicines are outlined above.  Orders Placed This Encounter  Procedures   Comprehensive metabolic panel   Magnesium   CBC   Lipid panel   Ambulatory referral to Cardiac Electrophysiology   EKG 12-Lead   ECHOCARDIOGRAM COMPLETE   VAS US CAROTID   Meds ordered this encounter  Medications   apixaban (ELIQUIS) 5 MG TABS tablet    Sig: Take 1 tablet (5 mg total) by mouth 2 (two) times daily.    Dispense:  180 tablet    Refill:  3    Patient Instructions  Medication Instructions:  No changes *If you need a refill on your cardiac medications before your next appointment, please call your pharmacy*   Lab Work: CBC, CMP, Mag, Lipid panel- today If you have labs (blood work) drawn today and your tests are completely normal, you will receive your results only by: MyChart Message (if you have MyChart) OR A paper copy in the mail If you have any lab test that is abnormal or we need to change your treatment, we will call you to review the results.   Testing/Procedures: Your physician has requested that you have an echocardiogram. Echocardiography  is a painless test that uses sound waves to create images of  your heart. It provides your doctor with information about the size and shape of your heart and how well your heart's chambers and valves are working. This procedure takes approximately one hour. There are no restrictions for this procedure. Please do NOT wear cologne, perfume, aftershave, or lotions (deodorant is allowed). Please arrive 15 minutes prior to your appointment time.   Your physician has requested that you have a carotid duplex. This test is an ultrasound of the carotid arteries in your neck. It looks at blood flow through these arteries that supply the brain with blood. Allow one hour for this exam. There are no restrictions or special instructions.     Follow-Up: At [facility], you and your health needs are our priority.  As part of our continuing mission to provide you with exceptional heart care, we have created designated Provider Care Teams.  These Care Teams include your primary Cardiologist (physician) and Advanced Practice Providers (APPs -  Physician Assistants and Nurse Practitioners) who all work together to provide you with the care you need, when you need it.  We recommend signing up for the patient portal called "MyChart".  Sign up information is provided on this After Visit Summary.  MyChart is used to connect with patients for Virtual Visits (Telemedicine).  Patients are able to view lab/test results, encounter notes, upcoming appointments, etc.  Non-urgent messages can be sent to your provider as well.   To learn more about what you can do with MyChart, go to [email or web address].    Your next appointment:   4 month(s)  Provider:   Little Ishikawa, MD       Signed, Little Ishikawa, MD  10/15/2022 11:07 PM    [facility]

## 2022-10-16 LAB — COMPREHENSIVE METABOLIC PANEL: Sodium: 138 mmol/L (ref 134–144)

## 2022-10-21 ENCOUNTER — Telehealth: Payer: Self-pay | Admitting: Cardiology

## 2022-10-21 ENCOUNTER — Ambulatory Visit (HOSPITAL_COMMUNITY): Admission: RE | Admit: 2022-10-21 | Payer: Medicare Other | Source: Ambulatory Visit

## 2022-10-21 NOTE — Telephone Encounter (Signed)
Received referral for LAAO with Dr. Lalla Brothers 7/31. LVM with patient to return call for scheduling.   Georgie Chard NP-C Structural Heart Team  Pager: [phone] Phone: [phone]

## 2022-10-22 ENCOUNTER — Telehealth: Payer: Self-pay | Admitting: Cardiology

## 2022-10-22 NOTE — Telephone Encounter (Signed)
Called to schedule LAAO referral however husbands notes she is currently hospitalized in [place], [place] for dehydration issues. Plan to call patient again next week after discharge.   Georgie Chard NP-C Structural Heart Team  Pager: [phone] Phone: [phone]

## 2022-10-26 ENCOUNTER — Telehealth: Payer: Self-pay

## 2022-11-02 NOTE — Telephone Encounter (Signed)
Spoke with the patient at length. She declined to schedule LAAO consult at this time.  She stated she will call back if she decides to schedule. Structural Heart direct number given. She was grateful for call.

## 2022-11-05 ENCOUNTER — Other Ambulatory Visit: Payer: Self-pay | Admitting: Internal Medicine

## 2022-11-05 NOTE — Telephone Encounter (Signed)
I spoke with Savannah Hobbs and she takes 2 lasix daily with an occasional third pill. She has an upcoming appointment with you 11/26/2022. Please advise Sir, thank you.

## 2022-11-10 ENCOUNTER — Ambulatory Visit (HOSPITAL_COMMUNITY)
Admission: RE | Admit: 2022-11-10 | Payer: Medicare Other | Source: Ambulatory Visit | Attending: Cardiology | Admitting: Cardiology

## 2022-11-15 ENCOUNTER — Other Ambulatory Visit: Payer: Self-pay | Admitting: Internal Medicine

## 2022-11-25 ENCOUNTER — Ambulatory Visit (HOSPITAL_COMMUNITY)
Admission: RE | Admit: 2022-11-25 | Discharge: 2022-11-25 | Disposition: A | Discharge status: Home / Self Care | Payer: Medicare Other | Source: Ambulatory Visit | Attending: Cardiology | Admitting: Cardiology

## 2022-11-25 DIAGNOSIS — I350 Nonrheumatic aortic (valve) stenosis: Secondary | ICD-10-CM | POA: Insufficient documentation

## 2022-11-25 LAB — ECHOCARDIOGRAM COMPLETE
AR max vel: 2 cm2
AV Area VTI: 1.59 cm2
AV Area mean vel: 1.88 cm2
AV Mean grad: 12 mmHg
AV Peak grad: 21.8 mmHg
Ao pk vel: 2.34 m/s
Area-P 1/2: 3.99 cm2
Calc EF: 62.8 %
MV VTI: 2.64 cm2
S' Lateral: 2.7 cm
Single Plane A2C EF: 63.8 %
Single Plane A4C EF: 60.8 %

## 2022-11-25 NOTE — Progress Notes (Signed)
  Echocardiogram 2D Echocardiogram has been performed.  Savannah Hobbs 11/25/2022, 4:05 PM

## 2022-11-26 ENCOUNTER — Ambulatory Visit: Payer: Medicare Other | Admitting: Internal Medicine

## 2022-12-02 ENCOUNTER — Telehealth: Payer: Self-pay

## 2022-12-02 DIAGNOSIS — K7460 Unspecified cirrhosis of liver: Secondary | ICD-10-CM

## 2022-12-02 NOTE — Telephone Encounter (Signed)
I have left detailed messages for Savannah Hobbs to call me back . Her last U/S was at [facility] I want to see if she wants to go there again.

## 2022-12-02 NOTE — Telephone Encounter (Signed)
-----   Message from [facility] sent at 08/04/2022  4:49 PM EDT ----- Set her up for a RUQ U/S in Oct 2024, call her first to see where she wants to do it. Lives in [place] I think. Dx cirrhosis.

## 2022-12-02 NOTE — Telephone Encounter (Signed)
Spoke with Rinaldo Cloud and she said Savannah Hobbs is fine to set up the U/S at. She prefers after 2:00pm the first or second week of October. I will order this tomorrow as they are closed now. I will call her after 10AM so as not to wake her with appointment details.

## 2022-12-02 NOTE — Telephone Encounter (Signed)
Patient calling back in regards to previous note. Please advise.

## 2022-12-03 NOTE — Telephone Encounter (Signed)
Savannah Hobbs informed of appointment date/time/place and she wrote it down and verbalized understanding.

## 2022-12-03 NOTE — Telephone Encounter (Signed)
I set her U/S up for 12/28/22 at [facility], arrive at 9:30am for a 10:00am . NPO midnight. They don't have afternoon appointments because of the NPO midnight. I will call and inform Sharna after 10AM so I don't wake her up today.

## 2022-12-19 ENCOUNTER — Encounter (HOSPITAL_COMMUNITY): Payer: Self-pay

## 2022-12-28 ENCOUNTER — Ambulatory Visit (HOSPITAL_COMMUNITY): Payer: Medicare Other

## 2022-12-29 ENCOUNTER — Telehealth: Payer: Self-pay | Admitting: Internal Medicine

## 2022-12-29 NOTE — Telephone Encounter (Signed)
Inbound call from [facility] on patient's behalf stating patient was recently in the ED and had a paracentesis. Patient was advised to follow up within 1 week. Rehab center requesting a call at [phone] ext [phone] to discuss appointment. Please advise, thank you.

## 2022-12-31 NOTE — Telephone Encounter (Signed)
Pt scheduled to see Dr. Leone Payor 01/07/23 at 9:30am. Facility aware of appt date and time and office address.

## 2023-01-05 ENCOUNTER — Ambulatory Visit: Payer: Medicare Other | Admitting: Internal Medicine

## 2023-01-05 ENCOUNTER — Telehealth: Payer: Self-pay | Admitting: Internal Medicine

## 2023-01-05 NOTE — Telephone Encounter (Signed)
Good Morning Dr. Leone Payor  Patient called stating that she would not be able to make her appointment with you today at 10:10 due to her rehab facility not arranging her transportation.

## 2023-01-05 NOTE — Telephone Encounter (Signed)
OK I see she has been rescheduled for January

## 2023-01-07 ENCOUNTER — Ambulatory Visit: Payer: Medicare Other | Admitting: Internal Medicine

## 2023-01-08 LAB — LAB REPORT - SCANNED: EGFR: 82

## 2023-01-11 ENCOUNTER — Other Ambulatory Visit: Payer: Self-pay | Admitting: Cardiology

## 2023-01-11 DIAGNOSIS — I480 Paroxysmal atrial fibrillation: Secondary | ICD-10-CM

## 2023-01-11 NOTE — Telephone Encounter (Signed)
Inbound call from patient's son requesting to discuss a sooner appointment due to cirrhosis of patient liver. States they were advised it may be at liver failure at this point. Son requesting a call back. Please advise, thank you.

## 2023-01-11 NOTE — Telephone Encounter (Addendum)
Pt son stated that pt had a recent paracentesis and was notified by Dr at the hospital that pt was in the end stage of Cirrhosis and was needing to follow up with her GI Dr for a first available appointment. Pt was scheduled to see Hyacinth Meeker PA on 01/12/2023 at 2:00 PM. Pt son made aware. Pt son confirmed that transportation would be available.  Pt verbalized understanding with all questions answered.

## 2023-01-12 ENCOUNTER — Ambulatory Visit (INDEPENDENT_AMBULATORY_CARE_PROVIDER_SITE_OTHER): Payer: Medicare Other | Admitting: Physician Assistant

## 2023-01-12 ENCOUNTER — Other Ambulatory Visit (INDEPENDENT_AMBULATORY_CARE_PROVIDER_SITE_OTHER): Payer: Medicare Other

## 2023-01-12 ENCOUNTER — Encounter: Payer: Self-pay | Admitting: Physician Assistant

## 2023-01-12 VITALS — BP 154/52 | HR 100 | Ht 65.0 in | Wt 197.0 lb

## 2023-01-12 DIAGNOSIS — K7460 Unspecified cirrhosis of liver: Secondary | ICD-10-CM

## 2023-01-12 DIAGNOSIS — K7290 Hepatic failure, unspecified without coma: Secondary | ICD-10-CM | POA: Diagnosis not present

## 2023-01-12 DIAGNOSIS — K7581 Nonalcoholic steatohepatitis (NASH): Secondary | ICD-10-CM | POA: Diagnosis not present

## 2023-01-12 LAB — COMPREHENSIVE METABOLIC PANEL
ALT: 10 U/L (ref 0–35)
AST: 29 U/L (ref 0–37)
Albumin: 2.5 g/dL — ABNORMAL LOW (ref 3.5–5.2)
Alkaline Phosphatase: 85 U/L (ref 39–117)
BUN: 15 mg/dL (ref 6–23)
CO2: 29 meq/L (ref 19–32)
Calcium: 8.3 mg/dL — ABNORMAL LOW (ref 8.4–10.5)
Chloride: 99 meq/L (ref 96–112)
Creatinine, Ser: 0.84 mg/dL (ref 0.40–1.20)
GFR: 68.22 mL/min (ref >60.00)
Glucose, Bld: 96 mg/dL (ref 70–99)
Potassium: 3.7 meq/L (ref 3.5–5.1)
Sodium: 133 meq/L — ABNORMAL LOW (ref 135–145)
Total Bilirubin: 1.2 mg/dL (ref 0.2–1.2)
Total Protein: 7.3 g/dL (ref 6.0–8.3)

## 2023-01-12 LAB — CBC WITH DIFFERENTIAL/PLATELET
Basophils Absolute: 0.1 10*3/uL (ref 0.0–0.1)
Basophils Relative: 0.4 % (ref 0.0–3.0)
Eosinophils Absolute: 0.4 10*3/uL (ref 0.0–0.7)
Eosinophils Relative: 1.9 % (ref 0.0–5.0)
HCT: 33.6 % — ABNORMAL LOW (ref 36.0–46.0)
Hemoglobin: 11 g/dL — ABNORMAL LOW (ref 12.0–15.0)
Lymphocytes Relative: 3.8 % — ABNORMAL LOW (ref 12.0–46.0)
Lymphs Abs: 0.8 10*3/uL (ref 0.7–4.0)
MCHC: 32.6 g/dL (ref 30.0–36.0)
MCV: 84.4 fL (ref 78.0–100.0)
Monocytes Absolute: 0.7 10*3/uL (ref 0.1–1.0)
Monocytes Relative: 3.5 % (ref 3.0–12.0)
Neutro Abs: 18.8 10*3/uL — ABNORMAL HIGH (ref 1.4–7.7)
Neutrophils Relative %: 90.4 % — ABNORMAL HIGH (ref 43.0–77.0)
Platelets: 255 10*3/uL (ref 150.0–400.0)
RBC: 3.98 Mil/uL (ref 3.87–5.11)
RDW: 21.8 % — ABNORMAL HIGH (ref 11.5–15.5)
WBC: 20.8 10*3/uL (ref 4.0–10.5)

## 2023-01-12 LAB — PROTIME-INR
INR: 2.9 {ratio} — ABNORMAL HIGH (ref 0.8–1.0)
Prothrombin Time: 29.2 s — ABNORMAL HIGH (ref 9.6–13.1)

## 2023-01-12 LAB — AMMONIA: Ammonia: 32 umol/L (ref 11–35)

## 2023-01-12 NOTE — Progress Notes (Addendum)
Chief Complaint: Follow-up after recent hospitalization for decompensated cirrhosis  HPI:    Savannah Hobbs is a 77 year old female with a past medical history as listed below including atrial flutter (11/25/22 echo with LVEF 60-65%, mild aortic stenosis and mild to moderate mitral stenosis), MASH cirrhosis, GERD, myelodysplasia-myeloproliferative disease, portal hypertensive gastropathy and colopathy, A-fib, esophageal varices and multiple others, known to Dr. Leone Payor, who was referred to me by Romeo Rabon, MD for follow-up after recent hospitalization for decompensated cirrhosis.      08/04/2022 patient seen in clinic by Dr. Leone Payor for cirrhosis.  At that time had edema.  Her diuretics were increased to Furosemide 40 mg and Spironolactone 100 mg.  Recommended to be met in [place] in 1 month.  Recommended follow-up in 3 months.  Discussed possibility of adding low-dose Carvedilol.  Also discussed follow-up EGD for history of varices.  Also do a routine 25-month ultrasound in October.    Patient brings with her a 1 to 2 inch stack of paperwork from her recent hospitalization.    12/12/2022-12/26/2022 admission for acute metabolic encephalopathy secondary to severe sepsis versus unwitnessed seizure and acute hypoxic respiratory failure.  At that time acute encephalopathy thought due to toxic metabolic etiologies.    12/12/2022 patient underwent bone marrow biopsy.    12/12/2022 CT of the abdomen pelvis without contrast showed cirrhosis with sequela of portal hypertension including splenomegaly, varices, ascites, thickened descending colon, small left pleural effusion, mild interstitial edema, ventral fat-containing hernia, mild edema of the gallbladder.    12/25/2022 paracentesis with 2700 cc of yellow fluid removed.    01/05/2023-01/08/2023 patient admitted to [facility] in [place].  Per discharge summary patient was admitted with acute hypoxic respiratory failure requiring 3 L nasal cannula secondary to  multifocal pneumonia and severe ascites which was present on admission and persistent.  Sepsis with endorgan damage, thought to be multifactorial with pneumonia and UTI.  HCAP, present on admission.  Acute uncomplicated UTI.  Acute on chronic ascites requiring paracentesis with known chronic liver failure.  Also acute multifocal pneumonia, hypomagnesemia, acute on chronic normocytic anemia, mild elevated hyperbilirubinemia, protein calorie malnutrition, myeloproliferative disorder, A-fib and GERD.  Apparently patient had been found unconscious by her husband with what appeared to be aspiration of vomit.  Patient was discharged home on Spironolactone 100 mg daily, Eliquis 5 mg twice daily, Pantoprazole 40 mg daily, Lactulose 20 mg twice daily, Furosemide 20 mg 3 times daily and Cefdinir.    H&P from admission showed 3+ pitting edema.  Diffuse crackles bilaterally in the lungs.  Distended abdomen with positive fluid waves.  Labs showed a white count of 26.65, hemoglobin 11.4, platelets 250.  Sodium 133, potassium 4.8, creatinine 0.85, T. bili 1.3, AST 42, ALT 17 alk phos 120.  Ammonia 25.  Lactic acid 1.8.  Urinalysis showed a significant large number of leukocyte esterase with 4+ bacteria rear.  Chest x-ray showed multifocal bilateral airspace disease suspicious for pneumonia.  Right upper quadrant ultrasound with ascites.    01/05/2023 abdominal ultrasound with ascites in all 4 quadrants.    01/05/2023 PT 16.7, INR 1.6.  Creatinine 0.85.    01/07/2023 paracentesis with removal of 2550 cc of yellow fluid.  10/17 CMP with a sodium low at 134 and glucose elevated 115 and otherwise normal.  Cultures negative.     01/08/2023 CBC with a WBC of 14.96, hemoglobin 9.4, MCV normal 86.9, platelets 183.    Patient diagnosed with SBP at time of admission as well.  Fluid  albumin level 0.7, protein 1.9.    Today, patient presents to clinic accompanied by her son.  They explain that she was doing fine and independent until  9/20 when she was first admitted to the hospital.  Apparently that night she had developed some chills and went to bed in a separate bedroom from her husband and the next morning he came in to find her having vomited and likely aspirated and unconscious having soiled the bed and called the paramedics.  This led to hospitalization #1 as above.  She was then discharged and then admitted again recently for respiratory distress.  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  They have been given at timeline of 6 months to a year and a half for her to live.  They have a lot of questions and just want to get a second opinion.  They asked questions about liver transplant and if hospice is necessary.  Apparently they are meeting with them this Thursday.    As far as how the patient feels she is still distended and has some what sounds like chronic peripheral edema.  She is still short of breath and requiring oxygen which is not something she needed previous to all of this.  She has no abdominal pain and is eating well.  She is on a stretcher at the time of her visit.  Son does report some ongoing "brain fog".    Denies fever, chills or weight loss.  GI procedures: Colonoscopy 11/27/2021 Impression:               - Eleven (11) diminutive polyps in the rectum, in                            the sigmoid colon, in the descending colon, in the                            transverse colon and in the cecum, removed with a                            cold snare. Resected and retrieved.  Pathology was a mix of hyperplastic SSP and adenoma and plan for recall in 2026 depending upon health status                           - Diverticulosis in the sigmoid colon.                           - External and internal hemorrhoids.                           - Altered vascular, congested and erythematous                            mucosa in the entire examined colon. I think this                             is portal colopathy.                           -  The examination was otherwise normal on direct                            and retroflexion views.                           - Personal history of colonic polyps.   EGD 11/27/2021 Impression:               - Grade I and small (< 5 mm) esophageal varices.                           - Portal hypertensive gastropathy.                           - Erythematous mucosa in the antrum. Biopsied.                            Gastritis vs portal gastropathy                           - Normal examined duodenum. Past Medical History:  Diagnosis Date   Abdominal wall cellulitis 01/18/2021   Allergy    Arthritis    Asthma    Cirrhosis of liver (HCC)    NASH   Erosive esophagitis 1995   Fatty liver    GERD (gastroesophageal reflux disease)    Hiatal hernia    LARGE   Hx of colonic polyps 05/27/2018   Hx of transfusion of platelets    Hyperlipidemia    Hypertension    Myelodysplasia-Myeloproliferative disease (HCC)    Paroxysmal atrial fibrillation (HCC)    Portal hypertensive gastropathy and coloapthy (HCC) 11/27/2021   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HCC) 11/27/2021   Thrombocytopenia (HCC)     Past Surgical History:  Procedure Laterality Date   ABDOMINAL HYSTERECTOMY     partial   APPENDECTOMY     ATRIAL FIBRILLATION ABLATION N/A 09/26/2020   Procedure: ATRIAL FIBRILLATION ABLATION;  Surgeon: Hillis Range, MD;  Location: [facility]AS[facility];  Service: Cardiovascular;  Laterality: N/A;   COLONOSCOPY     ESOPHAGOGASTRODUODENOSCOPY     GYNECOLOGIC CRYOSURGERY     x2   IR US GUIDE BX ASP/DRAIN  01/29/2021   REPLACEMENT TOTAL KNEE Left 2016   TONSILLECTOMY     TOTAL KNEE ARTHROPLASTY Right    TUBAL LIGATION     UPPER GASTROINTESTINAL ENDOSCOPY      Current Outpatient Medications  Medication Sig Dispense Refill   acetaminophen (TYLENOL) 500 MG tablet Take 500 mg by mouth every 6 (six) hours as needed for  moderate pain.     apixaban (ELIQUIS) 5 MG TABS tablet Take 1 tablet (5 mg total) by mouth 2 (two) times daily. 180 tablet 3   Ascorbic Acid (VITAMIN C) 1000 MG tablet Take 1,000 mg by mouth daily.     cetirizine (ZYRTEC) 10 MG tablet Take 10 mg by mouth in the morning.     diltiazem (CARDIZEM CD) 120 MG 24 hr capsule TAKE 1 CAPSULE BY MOUTH EVERY DAY 90 capsule 2   docusate sodium (COLACE) 100 MG capsule Take 100 mg by mouth 2 (two) times daily.     esomeprazole (NEXIUM) 20 MG capsule Take 20 mg by mouth daily before breakfast.  furosemide (LASIX) 20 MG tablet TAKE 3 TABLETS BY MOUTH EVERY DAY 270 tablet 1   spironolactone (ALDACTONE) 100 MG tablet Take 1 tablet by mouth daily.     spironolactone (ALDACTONE) 50 MG tablet Take 3 tablets (150 mg total) by mouth daily. (Patient not taking: Reported on 10/15/2022) 90 tablet 3   vitamin B-12 (CYANOCOBALAMIN) 1000 MCG tablet Take 1,000 mcg by mouth daily. (Patient not taking: Reported on 10/15/2022)     No current facility-administered medications for this visit.    Allergies as of 01/12/2023   (No Known Allergies)    Family History  Problem Relation Age of Onset   Hypertension Mother    Esophageal cancer Father    Colon polyps Neg Hx    Colon cancer Neg Hx    Rectal cancer Neg Hx    Stomach cancer Neg Hx     Social History   Socioeconomic History   Marital status: Married    Spouse name: Not on file   Number of children: 3   Years of education: Nursing degree   Highest education level: Not on file  Occupational History   Occupation: retired Charity fundraiser  Tobacco Use   Smoking status: Never   Smokeless tobacco: Never  Substance and Sexual Activity   Alcohol use: Yes    Alcohol/week: 1.0 standard drink of alcohol    Types: 1 Glasses of wine per week   Drug use: No   Sexual activity: Not on file  Other Topics Concern   Not on file  Social History Narrative   Married, retired Engineer, 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   3 sons, 08/22/20171 son died in his sleep unclear cause   Right handed    Some caffeine    Social Determinants of Health   Financial Resource Strain: Not on file  Food Insecurity: Not on file  Transportation Needs: Not on file  Physical Activity: Not on file  Stress: Not on file  Social Connections: Not on file  Intimate Partner Violence: Not on file    Review of Systems:    Constitutional: No fever Cardiovascular: No chest pain Respiratory: See HPI Gastrointestinal: See HPI and otherwise negative   Physical Exam:  Vital signs: BP (!) 154/52   Pulse 100   Ht 5\' 5"  (1.651 m)   Wt 197 lb (89.4 kg)   SpO2 96%   BMI 32.78 kg/m    Constitutional:   Pleasant Ill appearing, overweight, Caucasian female appears to be in NAD, Well developed, Well nourished, alert and cooperative + on O2 via [place]:  Normocephalic and atraumatic. Eyes:   PEERL, EOMI. No icterus. Conjunctiva pink. Ears:  Normal auditory acuity. Neck:  Supple Throat: Oral cavity and pharynx without inflammation, swelling or lesion.  Respiratory: Respirations even and unlabored. Lungs clear to auscultation bilaterally.   No wheezes, crackles, or rhonchi.  Cardiovascular: Normal S1, S2. No MRG. Regular rate and rhythm. No peripheral edema, cyanosis or pallor.  Gastrointestinal:  Soft, mild to moderate distention, nontender. No rebound or guarding. Normal bowel sounds. No appreciable masses or hepatomegaly. Rectal:  Not performed.  Msk:  Symmetrical without gross deformities. Without edema, no deformity or joint abnormality.  Neurologic:  Alert and  oriented x4;  grossly normal neurologically.  Skin:   Dry and intact without significant lesions or rashes. Psychiatric: Demonstrates good judgement and reason without abnormal affect or behaviors.  RELEVANT LABS AND IMAGING: CBC    Component Value Date/Time   WBC  28.5 (HH) 10/15/2022 1613   WBC 30.7 (H) 01/25/2022 0224   RBC 4.16 10/15/2022 1613    RBC 3.75 (L) 01/25/2022 0224   HGB 12.0 10/15/2022 1613   HCT 35.7 10/15/2022 1613   PLT 209 10/15/2022 1613   MCV 86 10/15/2022 1613   MCH 28.8 10/15/2022 1613   MCH 31.2 01/25/2022 0224   MCHC 33.6 10/15/2022 1613   MCHC 33.9 01/25/2022 0224   RDW 17.8 (H) 10/15/2022 1613   LYMPHSABS 1.4 01/23/2022 1053   LYMPHSABS 0.9 09/02/2020 1521   MONOABS 0.7 01/23/2022 1053   EOSABS 0.0 01/23/2022 1053   EOSABS 0.2 09/02/2020 1521   BASOSABS 0.7 (H) 01/23/2022 1053   BASOSABS 0.0 09/02/2020 1521    CMP     Component Value Date/Time   NA 138 10/15/2022 1613   K 4.0 10/15/2022 1613   CL 101 10/15/2022 1613   CO2 25 10/15/2022 1613   GLUCOSE 136 (H) 10/15/2022 1613   GLUCOSE 97 01/25/2022 0224   BUN 16 10/15/2022 1613   CREATININE 0.58 10/15/2022 1613   CALCIUM 9.1 10/15/2022 1613   PROT 7.6 10/15/2022 1613   ALBUMIN 3.8 10/15/2022 1613   AST 43 (H) 10/15/2022 1613   ALT 21 10/15/2022 1613   ALKPHOS 183 (H) 10/15/2022 1613   BILITOT 1.1 10/15/2022 1613   GFRNONAA >60 01/25/2022 0224   GFRAA 118 03/05/2020 1629    Assessment: 1.  Decompensated MASH cirrhosis: With imaging showing varices, ascites, recent hepatic encephalopathy and hospitalization 2.  Acute respiratory failure: Admitted recently for aspiration pneumonia and HCAP, now on O2  Plan: 1.  Ordered labs today so that we can get better idea of some of the patient's numbers including MELD score and child's Pugh classification. 2.  Ordered CBC, CMP, PT/INR, AFP, ammonia level 3.  Spent extensive time reviewing recent records from hospitalization.  These are summarized as above and will get scanned into the patient's chart. 4.  Currently continue medications including Spironolactone 100 mg daily and Lasix 40 mg daily as well as Lactulose 20 mg twice daily 5.  Discussed hospice with the patient's son Janashia Honey who would like to be called once we have a better idea of how we feel she is doing.  His phone number is  [phone].  Apparently they have a meeting with hospice on Thursday.  Explained that this is something they should keep at least to get more information about it. 6.  They asked questions in regards to possible liver transplant.  Will need to look into this further once we have recent labs. 7.  Discussed with patient that myself or Dr. Leone Payor would give them a call by the end of the week to give further recommendations going forward.  Overall time with the patient and in review of records was greater than 60 minutes.  Hyacinth Meeker, PA-C [facility] 01/12/2023, 2:22 PM  Cc: Romeo Rabon, MD   Case discussed with Ms. Zerita Boers and I called the patient's son.  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  We have communicated with cardiology and have recommended holding Eliquis given that.  INR was drawn but it is not reliable in the setting of Eliquis, even with that number being excluded it seems like she is a child Pugh class C and has a significantly high meld.  Son requested that the patient see [facility] we will try for an appointment in [place].  He understands the gravity of the situation and  they are still considering hospice the patient is currently in the rehab facility within [facility] in [place].  Iva Boop, MD, [facility]

## 2023-01-12 NOTE — Patient Instructions (Signed)
Your provider has requested that you go to the basement level for lab work before leaving today. Press "B" on the elevator. The lab is located at the first door on the left as you exit the elevator.  We will call you with results and plans.  Due to recent changes in healthcare laws, you may see the results of your imaging and laboratory studies on MyChart before your provider has had a chance to review them.  We understand that in some cases there may be results that are confusing or concerning to you. Not all laboratory results come back in the same time frame and the provider may be waiting for multiple results in order to interpret others.  Please give [place] 48 hours in order for your provider to thoroughly review all the results before contacting the office for clarification of your results.   I appreciate the opportunity to care for you. Hyacinth Meeker, PA-C

## 2023-01-13 LAB — AFP TUMOR MARKER: AFP-Tumor Marker: 9 ng/mL — ABNORMAL HIGH

## 2023-01-14 ENCOUNTER — Telehealth: Payer: Self-pay | Admitting: Internal Medicine

## 2023-01-14 NOTE — Telephone Encounter (Signed)
Spoke to patient's son.  Patient recently seen by Hyacinth Meeker, PA-C in the office here after having acute liver failure on top of chronic liver failure/cirrhosis issues and was hospitalized in [place].  Laboratory studies show changes consistent with [name] C liver disease, I think she has advanced serious liver disease.  Her INR was 2.7 but she is still on Eliquis so that is not a reliable number but even taking that into account I think she is a [name] class C with respect to liver disease.  This makes MELD score difficult to calculate.  I explained to the son that it is reasonable to pursue hospice though she could improve and not need that it is hard to tell.  He would like an evaluation at [facility] so we will refer to the [facility] clinic with Dr. Audrie Lia that meets at [facility] surgery office for an evaluation for her severe liver disease and prognosis.  I explained to the son that it seems like given the state of her liver disease she should not be on Eliquis according to pharmacology recommendations..  I am copying her cardiologist Dr. Jerene Pitch to ask him to weigh in and advise if possible.  She does have a 02/24/2023 appointment with him.  I do not think she has had signs of bleeding.

## 2023-01-14 NOTE — Telephone Encounter (Signed)
Yes agree with holding eliquis

## 2023-01-15 NOTE — Telephone Encounter (Signed)
Spoke with patient's son & advised him of MD recommendations to hold eliquis. Referral has been faxed to Dr. Audrie Lia at [phone]. He's been advised to call back in two weeks if he has not heard from her office for an appointment.

## 2023-01-15 NOTE — Telephone Encounter (Signed)
Thanks Filiberto Pinks please notify the patient's son about this.

## 2023-01-22 ENCOUNTER — Telehealth: Payer: Self-pay | Admitting: Internal Medicine

## 2023-01-22 NOTE — Telephone Encounter (Signed)
Dr. Audrie Lia [facility] called re: appt. Pt declined evaluation right now due to current status and in rehab etc.  Dr. Brooke Dare willing to see patient and evaluate but left it with patient/family to call back and schedule

## 2023-02-21 NOTE — Progress Notes (Unsigned)
Cardiology Office Note:    Date:  02/23/2023   ID:  Toshi, Wion March 20, 1946, MRN [id number]  PCP:  Romeo Rabon, MD  Cardiologist:  Little Ishikawa, MD  Electrophysiologist:  Hillis Range, MD (Inactive)   Referring MD: Romeo Rabon, MD   Chief Complaint  Patient presents with   Atrial Fibrillation    History of Present Illness:    Savannah Hobbs is a 77 y.o. female with a hx of paroxysmal atrial fibrillation, cirrhosis, hyperlipidemia who presents for follow-up.  She was diagnosed with COVID-19 in October 2020.  Had an episode of atrial fibrillation while recovering from this.  In January 2021 she had a second episode of atrial fibrillation that occurred after she received her first dose of the Covid vaccine.  She initially followed with cardiologist in [place] and was started on metoprolol.  She was not started on anticoagulation.  She presented to [facility] on 01/22/2020 with A. fib with RVR.  Echocardiogram showed normal biventricular function, mild MR, mild AS.  She was started on diltiazem drip for rate control and converted to normal sinus rhythm.  She was switched to p.o. diltiazem.  Eliquis 5 mg twice daily was started given CHA2DS2-VASc 4.  She reported having chest pain and a Lexiscan Myoview was done on 02/02/2020, which showed normal perfusion, EF 68%.  She saw Dr. Johney Frame, underwent ablation on 09/26/2020 for A. fib/flutter.  She was admitted from 10/28 through 02/02/2021 with abdominal wall cellulitis and a ventral wall umbilical hernia and periumbilical abscess.  She completed a course of antibiotics, was felt to be high risk for surgery due to underlying cirrhosis.  She was discharged on 100 mg of spironolactone/40 mg Lasix for her cirrhosis.  Echocardiogram 11/2022 showed EF 60 to 65%, normal RV function, mild to moderate mitral stenosis, mild aortic stenosis.  Since last clinic appointment, reports she was found down 11/2022 by her husband, had  prolonged hospitalization at [facility] until November.  She currently reports she is doing okay.  She denies any chest pain, dyspnea, lightheadedness, syncope, lower extremity edema, or palpitations.     Wt Readings from Last 3 Encounters:  02/23/23 167 lb (75.8 kg)  01/12/23 197 lb (89.4 kg)  10/15/22 191 lb 3.2 oz (86.7 kg)     Past Medical History:  Diagnosis Date   Abdominal wall cellulitis 01/18/2021   Allergy    Arthritis    Asthma    Cirrhosis of liver (HCC)    NASH   Erosive esophagitis 1995   Fatty liver    GERD (gastroesophageal reflux disease)    Hiatal hernia    LARGE   Hx of colonic polyps 05/27/2018   Hx of transfusion of platelets    Hyperlipidemia    Hypertension    Myelodysplasia-Myeloproliferative disease (HCC)    Paroxysmal atrial fibrillation (HCC)    Portal hypertensive gastropathy and coloapthy (HCC) 11/27/2021   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HCC) 11/27/2021   Thrombocytopenia (HCC)     Past Surgical History:  Procedure Laterality Date   ABDOMINAL HYSTERECTOMY     partial   APPENDECTOMY     ATRIAL FIBRILLATION ABLATION N/A 09/26/2020   Procedure: ATRIAL FIBRILLATION ABLATION;  Surgeon: Hillis Range, MD;  Location: [facility] INV[facility];  Service: Cardiovascular;  Laterality: N/A;   COLONOSCOPY     ESOPHAGOGASTRODUODENOSCOPY     GYNECOLOGIC CRYOSURGERY     x2   IR US GUIDE BX ASP/DRAIN  01/29/2021   REPLACEMENT TOTAL KNEE Left  2016   TONSILLECTOMY     TOTAL KNEE ARTHROPLASTY Right    TUBAL LIGATION     UPPER GASTROINTESTINAL ENDOSCOPY      Current Medications: Current Meds  Medication Sig   acetaminophen (TYLENOL) 500 MG tablet Take 500 mg by mouth every 6 (six) hours as needed for moderate pain.   albuterol (VENTOLIN HFA) 108 (90 Base) MCG/ACT inhaler Inhale 2 puffs into the lungs as needed.   apixaban (ELIQUIS) 5 MG TABS tablet Take 1 tablet (5 mg total) by mouth 2 (two) times daily.   cetirizine (ZYRTEC) 10 MG tablet Take  10 mg by mouth in the morning.   diltiazem (CARDIZEM CD) 120 MG 24 hr capsule TAKE 1 CAPSULE BY MOUTH EVERY DAY   docusate sodium (COLACE) 100 MG capsule Take 100 mg by mouth 2 (two) times daily.   furosemide (LASIX) 40 MG tablet Take 40 mg by mouth daily.   ipratropium-albuterol (DUONEB) 0.5-2.5 (3) MG/3ML SOLN Take 3 mLs by nebulization as needed.   KLOR-CON M20 20 MEQ tablet Take 1 tablet by mouth daily.   pantoprazole (PROTONIX) 40 MG tablet Take 40 mg by mouth daily.   spironolactone (ALDACTONE) 100 MG tablet Take 1 tablet by mouth daily.     Allergies:   Patient has no known allergies.   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   3 sons, [id number] son died in his sleep unclear cause   Right handed    Some caffeine    Social Determinants of Health   Financial Resource Strain: Not on file  Food Insecurity: Not on file  Transportation Needs: Not on file  Physical Activity: Not on file  Stress: Not on file  Social Connections: Not on file     Family History: The patient's family history includes Esophageal cancer in her father; Hypertension in her mother. There is no history of Colon polyps, Colon cancer, Rectal cancer, or Stomach cancer.  ROS:   Please see the history of present illness.     All other systems reviewed and are negative.  EKGs/Labs/Other Studies Reviewed:     The following studies were reviewed today:   EKG:   10/15/22: Normal sinus rhythm with PACs 02/23/23: Normal sinus rhythm, PACs, rate 82  Recent Labs: 10/15/2022: Magnesium 1.7 01/12/2023: ALT 10; BUN 15; Creatinine, Ser 0.84; Hemoglobin 11.0; Platelets 255.0; Potassium 3.7; Sodium 133  Recent Lipid Panel    Component Value Date/Time   CHOL 162 10/15/2022 1613   TRIG 164 (H) 10/15/2022 1613   HDL 59 10/15/2022 1613   CHOLHDL 2.7 10/15/2022 1613   CHOLHDL 2.8 01/23/2020 0222   VLDL 28 01/23/2020 0222   LDLCALC 75 10/15/2022 1613    Physical Exam:    VS:  BP 134/60 (BP Location: Left Arm, Patient  Position: Sitting, Cuff Size: Normal)   Pulse 82   Ht 5\' 5"  (1.651 m)   Wt 167 lb (75.8 kg)   SpO2 100%   BMI 27.79 kg/m     Wt Readings from Last 3 Encounters:  02/23/23 167 lb (75.8 kg)  01/12/23 197 lb (89.4 kg)  10/15/22 191 lb 3.2 oz (86.7 kg)     GEN: in no acute distress HEENT: Normal NECK: No JVD CARDIAC: RRR, 2 out of 6 systolic murmur RESPIRATORY:  Clear to auscultation without rales, wheezing or rhonchi  ABDOMEN: Soft, non-tender, non-distended MUSCULOSKELETAL: 2+ bilateral lower extremity edema SKIN: Warm and dry NEUROLOGIC:  Alert and oriented x 3 PSYCHIATRIC:  Normal affect   ASSESSMENT:    1. Atrial fibrillation, unspecified type (HCC)   2. Stenosis of carotid artery, unspecified laterality   3. Mild aortic stenosis   4. Mitral valve stenosis, unspecified etiology   5. Essential hypertension   6. Hyperlipidemia, unspecified hyperlipidemia type      PLAN:    Paroxysmal atrial fibrillation: CHA2DS2-VASc score 3 (hypertension, age, female).  Lexiscan Myoview was done on 02/02/2020, which showed normal perfusion, EF 68%.  Underwent ablation on 09/26/2020 for A. fib/flutter with Dr Johney Frame. -Continue diltiazem 120 mg daily -In discussion with her gastroentrologist, Dr Elenore Paddy, agree with holding Eliquis for now given her severe liver disease She reports  recent falls and was referred to Dr. Lalla Brothers for [facility] evaluation.  Also at increased bleeding risk due to cirrhosis.  She previously declined watchman evaluation but now is agreeable, will refer to Dr. Lalla Brothers   Syncope: Was found down by her husband in September.  Had prolonged admission from [facility].  Will obtain records.  Check Zio patch x 2 weeks  Chest pain: Atypical in description. Lexiscan Myoview was done on 02/02/2020, which showed normal perfusion, EF 68%.  No further work-up recommended  Aortic stenosis: Mild on echocardiogram on 01/23/2020.  Mild on echocardiogram 11/2022.  Will continue to monitor  Mitral stenosis: Mild to moderate on echocardiogram 11/2022.  Will monitor  Hypertension: Appears controlled, continue diltiazem  Cirrhosis/Chronic diastolic heart failure: On 100 mg spironolactone/40 mg Lasix.  Appears euvolemic.  Normal electrolytes on labs 02/16/2023  Hyperlipidemia: On atorvastatin 40 mg daily.  Calcium score 206 (78th percentile on 09/19/2020).  LDL 75 on 10/15/2022  Carotid stenosis:  1 to 39% bilateral carotid artery stenosis on duplex 02/23/2023.  Will monitor  ?TIA: Admitted to [facility] with possible TIA.  Neurology following   RTC in 6 months   Medication Adjustments/Labs and Tests Ordered: Current medicines are reviewed at length with the patient today.  Concerns regarding medicines are outlined above.  Orders Placed This Encounter  Procedures   Ambulatory referral to Cardiac Electrophysiology   LONG TERM MONITOR (3-14 DAYS)   EKG 12-Lead   No orders of the defined types were placed in this encounter.   Patient Instructions  Medication Instructions:  Continue all current mediations *If you need a refill on your cardiac medications before your next appointment, please call your pharmacy*   Lab Work: none If you have labs (blood work) drawn today and your tests are completely normal, you will receive your results only by: MyChart Message (if  you have MyChart) OR A paper copy in the mail If you have any lab test that is abnormal or we need to change your treatment, we will call you to review the results.   Testing/Procedures: Christena Deem- Long Term Monitor Instructions  Your physician has requested  you wear a ZIO patch monitor for 14 days.  This is a single patch monitor. [employer] supplies one patch monitor per enrollment. Additional stickers are not available. Please do not apply patch if you will be having a Nuclear Stress Test,  Echocardiogram, Cardiac CT, MRI, or Chest Xray during the period you would be wearing the  monitor. The patch cannot be worn during these tests. You cannot remove and re-apply the  ZIO XT patch monitor.  Your ZIO patch monitor will be mailed 3 day USPS to your address on file. It may take 3-5 days  to receive your monitor after you have been enrolled.  Once you have received your monitor, please review the enclosed instructions. Your monitor  has already been registered assigning a specific monitor serial # to you.  Billing and Patient Assistance Program Information  We have supplied [employer] with any of your insurance information on file for billing purposes. [employer] offers a sliding scale Patient Assistance Program for patients that do not have  insurance, or whose insurance does not completely cover the cost of the ZIO monitor.  You must apply for the Patient Assistance Program to qualify for this discounted rate.  To apply, please call [employer] at [phone], select option 4, select option 2, ask to apply for  Patient Assistance Program. Meredeth Ide will ask your household income, and how many people  are in your household. They will quote your out-of-pocket cost based on that information.  [employer] will also be able to set up a 72-month, interest-free payment plan if needed.  Applying the monitor   Shave hair from upper left chest.  Hold abrader disc by orange tab. Rub abrader in 40 strokes over the  upper left chest as  indicated in your monitor instructions.  Clean area with 4 enclosed alcohol pads. Let dry.  Apply patch as indicated in monitor instructions. Patch will be placed under collarbone on left  side of chest with arrow pointing upward.  Rub patch adhesive wings for 2 minutes. Remove white label marked "1". Remove the white  label marked "2". Rub patch adhesive wings for 2 additional minutes.  While looking in a mirror, press and release button in center of patch. A small green light will  flash 3-4 times. This will be your only indicator that the monitor has been turned on.  Do not shower for the first 24 hours. You may shower after the first 24 hours.  Press the button if you feel a symptom. You will hear a small click. Record Date, Time and  Symptom in the Patient Logbook.  When you are ready to remove the patch, follow instructions on the last 2 pages of Patient  Logbook. Stick patch monitor onto the last page of Patient Logbook.  Place Patient Logbook in the blue and white box. Use locking tab on box and tape box closed  securely. The blue and white box has prepaid postage on it. Please place it in the mailbox as  soon as possible. Your physician should have your test results approximately 7 days after the  monitor has been mailed back to [facility].  Call [facility] Customer Care at [phone] if you have questions regarding  your ZIO XT patch monitor. Call them immediately if you see an orange light blinking on your  monitor.  If your monitor falls off in less than 4 days, contact our Monitor department at [phone].  If your monitor becomes loose or falls off after 4 days call [employer]  at [phone] for  suggestions on securing your monitor    Follow-Up: At [facility], you and your health needs are our priority.  As part of our continuing mission to provide you with exceptional heart care, we have created designated Provider Care Teams.   These Care Teams include your primary Cardiologist (physician) and Advanced Practice Providers (APPs -  Physician Assistants and Nurse Practitioners) who all work together to provide you with the care you need, when you need it.  We recommend signing up for the patient portal called "MyChart".  Sign up information is provided on this After Visit Summary.  MyChart is used to connect with patients for Virtual Visits (Telemedicine).  Patients are able to view lab/test results, encounter notes, upcoming appointments, etc.  Non-urgent messages can be sent to your provider as well.   To learn more about what you can do with MyChart, go to [email or web address].    Your next appointment:   6 month(s)  Provider:   Little Ishikawa, MD     Other Instructions Refeeral to EP - someone will call you   Signed, Little Ishikawa, MD  02/23/2023 5:29 PM    [facility]

## 2023-02-23 ENCOUNTER — Ambulatory Visit
Admission: RE | Admit: 2023-02-23 | Discharge: 2023-02-23 | Disposition: A | Discharge status: Home / Self Care | Payer: Medicare Other | Source: Ambulatory Visit | Attending: Cardiology | Admitting: Cardiology

## 2023-02-23 ENCOUNTER — Ambulatory Visit (INDEPENDENT_AMBULATORY_CARE_PROVIDER_SITE_OTHER): Payer: Medicare Other | Admitting: Cardiology

## 2023-02-23 ENCOUNTER — Ambulatory Visit (INDEPENDENT_AMBULATORY_CARE_PROVIDER_SITE_OTHER): Payer: Medicare Other

## 2023-02-23 ENCOUNTER — Encounter: Payer: Self-pay | Admitting: Cardiology

## 2023-02-23 ENCOUNTER — Telehealth: Payer: Self-pay | Admitting: *Deleted

## 2023-02-23 VITALS — BP 134/60 | HR 82 | Ht 65.0 in | Wt 167.0 lb

## 2023-02-23 DIAGNOSIS — I050 Rheumatic mitral stenosis: Secondary | ICD-10-CM | POA: Insufficient documentation

## 2023-02-23 DIAGNOSIS — I350 Nonrheumatic aortic (valve) stenosis: Secondary | ICD-10-CM

## 2023-02-23 DIAGNOSIS — I6529 Occlusion and stenosis of unspecified carotid artery: Secondary | ICD-10-CM

## 2023-02-23 DIAGNOSIS — I10 Essential (primary) hypertension: Secondary | ICD-10-CM | POA: Diagnosis present

## 2023-02-23 DIAGNOSIS — I4891 Unspecified atrial fibrillation: Secondary | ICD-10-CM

## 2023-02-23 DIAGNOSIS — E785 Hyperlipidemia, unspecified: Secondary | ICD-10-CM | POA: Diagnosis present

## 2023-02-23 DIAGNOSIS — R55 Syncope and collapse: Secondary | ICD-10-CM | POA: Insufficient documentation

## 2023-02-23 NOTE — Telephone Encounter (Signed)
Spoke to patient and orders per Dr. Bjorn Pippin given to patient to stop Eliquis 5 mg and return to office as scheduled. Patient voiced an understanding.

## 2023-02-23 NOTE — Addendum Note (Signed)
Addended by: Alta Corning on: 02/23/2023 05:47 PM   Modules accepted: Orders

## 2023-02-23 NOTE — Progress Notes (Unsigned)
Enrolled patient for a 14 day Zio XT  monitor to be mailed to patients home  °

## 2023-02-23 NOTE — Patient Instructions (Addendum)
Medication Instructions:  Continue all current mediations *If you need a refill on your cardiac medications before your next appointment, please call your pharmacy*   Lab Work: none If you have labs (blood work) drawn today and your tests are completely normal, you will receive your results only by: MyChart Message (if you have MyChart) OR A paper copy in the mail If you have any lab test that is abnormal or we need to change your treatment, we will call you to review the results.   Testing/Procedures: Christena Deem- Long Term Monitor Instructions  Your physician has requested you wear a ZIO patch monitor for 14 days.  This is a single patch monitor. [employer] supplies one patch monitor per enrollment. Additional stickers are not available. Please do not apply patch if you will be having a Nuclear Stress Test,  Echocardiogram, Cardiac CT, MRI, or Chest Xray during the period you would be wearing the  monitor. The patch cannot be worn during these tests. You cannot remove and re-apply the  ZIO XT patch monitor.  Your ZIO patch monitor will be mailed 3 day USPS to your address on file. It may take 3-5 days  to receive your monitor after you have been enrolled.  Once you have received your monitor, please review the enclosed instructions. Your monitor  has already been registered assigning a specific monitor serial # to you.  Billing and Patient Assistance Program Information  We have supplied [employer] with any of your insurance information on file for billing purposes. [employer] offers a sliding scale Patient Assistance Program for patients that do not have  insurance, or whose insurance does not completely cover the cost of the ZIO monitor.  You must apply for the Patient Assistance Program to qualify for this discounted rate.  To apply, please call [employer] at [phone], select option 4, select option 2, ask to apply for  Patient Assistance Program. Meredeth Ide will ask your household income, and  how many people  are in your household. They will quote your out-of-pocket cost based on that information.  [employer] will also be able to set up a 73-month, interest-free payment plan if needed.  Applying the monitor   Shave hair from upper left chest.  Hold abrader disc by orange tab. Rub abrader in 40 strokes over the upper left chest as  indicated in your monitor instructions.  Clean area with 4 enclosed alcohol pads. Let dry.  Apply patch as indicated in monitor instructions. Patch will be placed under collarbone on left  side of chest with arrow pointing upward.  Rub patch adhesive wings for 2 minutes. Remove white label marked "1". Remove the white  label marked "2". Rub patch adhesive wings for 2 additional minutes.  While looking in a mirror, press and release button in center of patch. A small green light will  flash 3-4 times. This will be your only indicator that the monitor has been turned on.  Do not shower for the first 24 hours. You may shower after the first 24 hours.  Press the button if you feel a symptom. You will hear a small click. Record Date, Time and  Symptom in the Patient Logbook.  When you are ready to remove the patch, follow instructions on the last 2 pages of Patient  Logbook. Stick patch monitor onto the last page of Patient Logbook.  Place Patient Logbook in the blue and white box. Use locking tab on box and tape box closed  securely. The blue and white  box has prepaid postage on it. Please place it in the mailbox as  soon as possible. Your physician should have your test results approximately 7 days after the  monitor has been mailed back to [facility].  Call [facility] Customer Care at [phone] if you have questions regarding  your ZIO XT patch monitor. Call them immediately if you see an orange light blinking on your  monitor.  If your monitor falls off in less than 4 days, contact our Monitor department at [phone].  If your monitor  becomes loose or falls off after 4 days call [employer] at [phone] for  suggestions on securing your monitor    Follow-Up: At [facility], you and your health needs are our priority.  As part of our continuing mission to provide you with exceptional heart care, we have created designated Provider Care Teams.  These Care Teams include your primary Cardiologist (physician) and Advanced Practice Providers (APPs -  Physician Assistants and Nurse Practitioners) who all work together to provide you with the care you need, when you need it.  We recommend signing up for the patient portal called "MyChart".  Sign up information is provided on this After Visit Summary.  MyChart is used to connect with patients for Virtual Visits (Telemedicine).  Patients are able to view lab/test results, encounter notes, upcoming appointments, etc.  Non-urgent messages can be sent to your provider as well.   To learn more about what you can do with MyChart, go to [email or web address].    Your next appointment:   6 month(s)  Provider:   Little Ishikawa, MD     Other Instructions Refeeral to EP - someone will call you

## 2023-03-01 DIAGNOSIS — I4891 Unspecified atrial fibrillation: Secondary | ICD-10-CM | POA: Diagnosis not present

## 2023-03-31 ENCOUNTER — Ambulatory Visit: Payer: Medicare Other | Admitting: Internal Medicine

## 2023-04-22 ENCOUNTER — Telehealth: Payer: Self-pay | Admitting: Physician Assistant

## 2023-04-22 NOTE — Telephone Encounter (Signed)
Spoke to patient - she is going to hospice  She has active infection(s) it sounds like  I so not think IR would do a Pleur-X catheter if so  I have advised her to discuss this with hospice and let them decide next steps

## 2023-04-22 NOTE — Telephone Encounter (Signed)
Left message for patient to call back

## 2023-04-22 NOTE — Telephone Encounter (Signed)
Patient calls indicating that she has an appointment with Dr Leone Payor tomorrow for cirrhosis follow up but is currently hospitalized. 04/23/23 appointment has been cancelled.   Patient says she will be going home on palliative care. However, she inquires as to whether long term drain for treatment of her ascites could be placed by [facility] since [facility] does not provide this option. States she has been having paracentesis multiple times per week and would like to look into another option to prevent so many visits.

## 2023-04-22 NOTE — Telephone Encounter (Signed)
PT has been hospitalized and wants to have a nurse call her. Please advise.

## 2023-04-23 ENCOUNTER — Ambulatory Visit: Payer: Medicare Other | Admitting: Internal Medicine

## 2023-04-23 ENCOUNTER — Other Ambulatory Visit: Payer: Self-pay | Admitting: General Practice

## 2023-05-04 LAB — LAB REPORT - SCANNED: EGFR: 47

## 2023-05-05 ENCOUNTER — Telehealth: Payer: Self-pay | Admitting: Internal Medicine

## 2023-05-05 NOTE — Telephone Encounter (Signed)
It is inappropriate for me to make a referral at this point as I am not actively involved in her care, presently.  If she remains on antibiotics to suppress infection or  if she is not going into hospice I would not recommend the Pleur-X catheter.

## 2023-05-05 NOTE — Telephone Encounter (Signed)
Left message with female for patient to call back.

## 2023-05-05 NOTE — Telephone Encounter (Signed)
Patient is calling to revisit possibly having Pleur-X catheter placement. She states she is told she no longer has infection of peritoneal fluid. States she is currently in rehab and has not followed with hospice or palliative care at this point.  Dr Leone Payor, please advise with your thoughts on this.

## 2023-05-05 NOTE — Telephone Encounter (Signed)
Inbound call from patient requesting a call to discuss notes below further. States she no longer has infection. Please advise, thank you.

## 2023-05-06 NOTE — Telephone Encounter (Signed)
Spoke with patient to advise that Dr Leone Payor does not recommend Pleur X catheter, especially with recent infection, any current antibiotics to suppress infection or if not under hospice care. Patient verbalizes understanding.

## 2023-05-17 NOTE — Telephone Encounter (Signed)
 Inbound call from patient, would like to discuss referral again for catheter. States she no longer is on any antibiotics and would like to discuss.

## 2023-05-17 NOTE — Telephone Encounter (Signed)
 Returned call to patient, pt is currently admitted to the hospital. I spoke with her husband Savannah Hobbs, pt has been scheduled for a follow up with Hyacinth Meeker, PA-C on 05/25/23 at 1:30 pm for further discussion of catheter. Savannah Hobbs repots that patient has been doing well, not on any antibiotics at this time. Will discuss possibility of catheter next week. Savannah Hobbs will relay the information to patient.

## 2023-05-24 LAB — LAB REPORT - SCANNED: EGFR: 77

## 2023-05-25 ENCOUNTER — Other Ambulatory Visit: Payer: Self-pay | Admitting: Physician Assistant

## 2023-05-25 ENCOUNTER — Ambulatory Visit: Payer: Medicare Other | Admitting: Physician Assistant

## 2023-05-25 ENCOUNTER — Other Ambulatory Visit (INDEPENDENT_AMBULATORY_CARE_PROVIDER_SITE_OTHER)

## 2023-05-25 ENCOUNTER — Encounter: Payer: Self-pay | Admitting: Physician Assistant

## 2023-05-25 VITALS — BP 120/60 | HR 85

## 2023-05-25 DIAGNOSIS — K7460 Unspecified cirrhosis of liver: Secondary | ICD-10-CM

## 2023-05-25 DIAGNOSIS — K760 Fatty (change of) liver, not elsewhere classified: Secondary | ICD-10-CM

## 2023-05-25 DIAGNOSIS — R188 Other ascites: Secondary | ICD-10-CM | POA: Diagnosis not present

## 2023-05-25 DIAGNOSIS — K7290 Hepatic failure, unspecified without coma: Secondary | ICD-10-CM

## 2023-05-25 LAB — CBC WITH DIFFERENTIAL/PLATELET
Basophils Absolute: 0.1 10*3/uL (ref 0.0–0.1)
Basophils Relative: 0.7 % (ref 0.0–3.0)
Eosinophils Absolute: 0.1 10*3/uL (ref 0.0–0.7)
Eosinophils Relative: 0.7 % (ref 0.0–5.0)
HCT: 30.9 % — ABNORMAL LOW (ref 36.0–46.0)
Hemoglobin: 10.4 g/dL — ABNORMAL LOW (ref 12.0–15.0)
Lymphocytes Relative: 5.9 % — ABNORMAL LOW (ref 12.0–46.0)
Lymphs Abs: 0.7 10*3/uL (ref 0.7–4.0)
MCHC: 33.5 g/dL (ref 30.0–36.0)
MCV: 84.7 fl (ref 78.0–100.0)
Monocytes Absolute: 0.4 10*3/uL (ref 0.1–1.0)
Monocytes Relative: 3 % (ref 3.0–12.0)
Neutro Abs: 10.7 10*3/uL — ABNORMAL HIGH (ref 1.4–7.7)
Neutrophils Relative %: 89.7 % — ABNORMAL HIGH (ref 43.0–77.0)
Platelets: 274 10*3/uL (ref 150.0–400.0)
RBC: 3.64 Mil/uL — ABNORMAL LOW (ref 3.87–5.11)
RDW: 19.7 % — ABNORMAL HIGH (ref 11.5–15.5)
WBC: 11.9 10*3/uL — ABNORMAL HIGH (ref 4.0–10.5)

## 2023-05-25 LAB — COMPREHENSIVE METABOLIC PANEL
ALT: 12 U/L (ref 0–35)
AST: 29 U/L (ref 0–37)
Albumin: 2.7 g/dL — ABNORMAL LOW (ref 3.5–5.2)
Alkaline Phosphatase: 114 U/L (ref 39–117)
BUN: 12 mg/dL (ref 6–23)
CO2: 29 meq/L (ref 19–32)
Calcium: 8.3 mg/dL — ABNORMAL LOW (ref 8.4–10.5)
Chloride: 99 meq/L (ref 96–112)
Creatinine, Ser: 0.81 mg/dL (ref 0.40–1.20)
GFR: 71.08 mL/min (ref >60.00)
Glucose, Bld: 104 mg/dL — ABNORMAL HIGH (ref 70–99)
Potassium: 3.5 meq/L (ref 3.5–5.1)
Sodium: 135 meq/L (ref 135–145)
Total Bilirubin: 2.1 mg/dL — ABNORMAL HIGH (ref 0.2–1.2)
Total Protein: 8 g/dL (ref 6.0–8.3)

## 2023-05-25 LAB — PROTIME-INR
INR: 1.9 ratio — ABNORMAL HIGH (ref 0.8–1.0)
Prothrombin Time: 19.2 s — ABNORMAL HIGH (ref 9.6–13.1)

## 2023-05-25 NOTE — Progress Notes (Signed)
 Chief Complaint: Nausea and vomiting, follow-up MASLD  HPI:    Savannah Hobbs is a 78 year old female with a past medical history as listed below including atrial flutter (9//24 echo with LVEF 60-65%, mild aortic stenosis and mild to moderate mitral stenosis), NASH cirrhosis, GERD, myelodysplasia-myeloproliferative disease, portal hypertensive gastropathy and colopathy, A-fib, esophageal varices and multiple others, known to Dr. Leone Payor, who presents to clinic today for follow-up of her decompensated cirrhosis as well as an acute complaint of nausea and vomiting.  08/04/2022 patient seen in clinic by Dr. Leone Payor for cirrhosis.  At that time had edema.  Her diuretics were increased to Furosemide 40 mg and Spironolactone 100 mg.  Recommended to be met in [facility] in 1 month.  Recommended follow-up in 3 months.  Discussed possibility of adding low-dose Carvedilol.  Also discussed follow-up EGD for history of varices.  Also do a routine 34-month ultrasound in October.    Patient brings with her a 1 to 2 inch stack of paperwork from her recent hospitalization.    12/12/2022-12/26/2022 admission for acute metabolic encephalopathy secondary to severe sepsis versus unwitnessed seizure and acute hypoxic respiratory failure.  At that time acute encephalopathy thought due to toxic metabolic etiologies.    12/12/2022 patient underwent bone marrow biopsy.    12/12/2022 CT of the abdomen pelvis without contrast showed cirrhosis with sequela of portal hypertension including splenomegaly, varices, ascites, thickened descending colon, small left pleural effusion, mild interstitial edema, ventral fat-containing hernia, mild edema of the gallbladder.    12/25/2022 paracentesis with 2700 cc of yellow fluid removed.    01/05/2023-01/08/2023 patient admitted to [facility] in [place].  Per discharge summary patient was admitted with acute hypoxic respiratory failure requiring 3 L nasal cannula secondary to multifocal pneumonia and  severe ascites which was present on admission and persistent.  Sepsis with endorgan damage, thought to be multifactorial with pneumonia and UTI.  HCAP, present on admission.  Acute uncomplicated UTI.  Acute on chronic ascites requiring paracentesis with known chronic liver failure.  Also acute multifocal pneumonia, hypomagnesemia, acute on chronic normocytic anemia, mild elevated hyperbilirubinemia, protein calorie malnutrition, myeloproliferative disorder, A-fib and GERD.  Apparently patient had been found unconscious by her husband with what appeared to be aspiration of vomit.  Patient was discharged home on Spironolactone 100 mg daily, Eliquis 5 mg twice daily, Pantoprazole 40 mg daily, Lactulose 20 mg twice daily, Furosemide 20 mg 3 times daily and Cefdinir.    H&P from admission showed 3+ pitting edema.  Diffuse crackles bilaterally in the lungs.  Distended abdomen with positive fluid waves.  Labs showed a white count of 26.65, hemoglobin 11.4, platelets 250.  Sodium 133, potassium 4.8, creatinine 0.85, T. bili 1.3, AST 42, ALT 17 alk phos 120.  Ammonia 25.  Lactic acid 1.8.  Urinalysis showed a significant large number of leukocyte esterase with 4+ bacteria rear.  Chest x-ray showed multifocal bilateral airspace disease suspicious for pneumonia.  Right upper quadrant ultrasound with ascites.    01/05/2023 abdominal ultrasound with ascites in all 4 quadrants.    01/05/2023 PT 16.7, INR 1.6.  Creatinine 0.85.    01/07/2023 paracentesis with removal of 2550 cc of yellow fluid.  10/17 CMP with a sodium low at 134 and glucose elevated 115 and otherwise normal.  Cultures negative.     01/08/2023 CBC with a WBC of 14.96, hemoglobin 9.4, MCV normal 86.9, platelets 183.    Patient diagnosed with SBP at time of admission as well.  Fluid albumin level  0.7, protein 1.9.    01/12/2023 patient seen in clinic accompanied by her son.  She was doing fine and independent until 9/20 when she was first admitted to the  hospital.  At that time they had a lot of questions given her new diagnosis of cirrhosis.  At that time dealing with some distention and chronic peripheral edema.  Also some shortness of breath and requiring oxygen.  At that visit ordered labs to calculate a MELD score.  Patient continued on Spironolactone 100 mg daily and Lasix 40 mg daily as well as Lactulose 20 mg twice a day.  That time discussed case further with Dr. Leone Payor.  Her lab follow-up was consistent with Child-Pugh class C liver disease.  Recommended holding Eliquis.  Son requested the patient see [facility] liver clinic.    02/16/2023 patient seen by the transplant team at [facility].  At that time discussed consideration for liver transplant in the setting of decompensated cirrhosis with a meld of 15 and mildly elevated AFP (9).  They discussed that she may not be a liver transplant candidate given her advanced age with significant comorbidities.  Continued on Lasix 40 mg daily and Aldactone 100 mg daily.    04/22/2023 patient called our clinic and described that she had been admitted to the hospital was going home on palliative care.  Discussed that IR would not do a Pleurx catheter given that she had active infections.    05/05/2023 Dr. Leone Payor discussed that if she remained on antibiotics to suppress infection or she was not going into hospice then would not recommend the Pleurx catheter.    05/24/2023 labs include a CMP with a glucose elevated at 120, sodium low at 131, T. bili 1.8, albumin 2.4, AST 33, globulin 4.4, magnesium low at 1.4, CBC with hemoglobin low at 9.2, MCV normal, platelets 234.  Ona elevated at 66.    Today, patient presents to clinic accompanied by her husband.  She is in a wheelchair and actually looks slightly better than when I saw her last.  She did follow-up with [facility] liver transplant team as above in November but was told she was likely not a transplant candidate given her age and comorbidities.  She has not followed with them  since.  Also tells me she has been in the hospital at least 2 or 3 times already this year for she tells me an infection in her ascites.  I do not have all of this documentation (sounds like SBP).  Tells me that prior to that requiring a paracentesis maybe every 7 to 10 days on average regardless of her Lasix 40 mg daily and Spironolactone 100 mg daily.  Currently she is still in rehab after her last discharge.  Physical therapy is working with her to get up and walk around.  Bowel movements are good with continued lactulose.  She is asking about ways to manage her ascites going forward.  Tells me she does not feel like she needs to go to hospice at the moment.  They would like to find an option for her so the ascites is not as problematic.  They do live in [place].    Denies fever, chills, weight loss or blood in her stool.  GI procedures: Colonoscopy 11/27/2021 Impression:               - Eleven (11) diminutive polyps in the rectum, in  the sigmoid colon, in the descending colon, in the                            transverse colon and in the cecum, removed with a                            cold snare. Resected and retrieved.  Pathology was a mix of hyperplastic SSP and adenoma and plan for recall in 2026 depending upon health status                           - Diverticulosis in the sigmoid colon.                           - External and internal hemorrhoids.                           - Altered vascular, congested and erythematous                            mucosa in the entire examined colon. 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                           - The examination was otherwise normal on direct                            and retroflexion views.                           - Personal history of colonic polyps.   EGD 11/27/2021 Impression:               - Grade I and small (< 5 mm) esophageal varices.                           - Portal  hypertensive gastropathy.                           - Erythematous mucosa in the antrum. Biopsied.                            Gastritis vs portal gastropathy                           - Normal examined duodenum. Past Medical History:  Diagnosis Date   Abdominal wall cellulitis 01/18/2021   Allergy    Arthritis    Asthma    Cirrhosis of liver (HCC)    NASH   Erosive esophagitis 1995   Fatty liver    GERD (gastroesophageal reflux disease)    Hiatal hernia    LARGE   Hx of colonic polyps 05/27/2018   Hx of transfusion of platelets    Hyperlipidemia    Hypertension    Myelodysplasia-Myeloproliferative disease (HCC)    Paroxysmal atrial  fibrillation (HCC)    Portal hypertensive gastropathy and coloapthy (HCC) 11/27/2021   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HCC) 11/27/2021   Thrombocytopenia (HCC)     Past Surgical History:  Procedure Laterality Date   ABDOMINAL HYSTERECTOMY     partial   APPENDECTOMY     ATRIAL FIBRILLATION ABLATION N/A 09/26/2020   Procedure: ATRIAL FIBRILLATION ABLATION;  Surgeon: Hillis Range, MD;  Location: [facility] INVASIVE CV LAB;  Service: Cardiovascular;  Laterality: N/A;   COLONOSCOPY     ESOPHAGOGASTRODUODENOSCOPY     GYNECOLOGIC CRYOSURGERY     x2   IR US GUIDE BX ASP/DRAIN  01/29/2021   REPLACEMENT TOTAL KNEE Left 2016   TONSILLECTOMY     TOTAL KNEE ARTHROPLASTY Right    TUBAL LIGATION     UPPER GASTROINTESTINAL ENDOSCOPY      Current Outpatient Medications  Medication Sig Dispense Refill   acetaminophen (TYLENOL) 500 MG tablet Take 500 mg by mouth every 6 (six) hours as needed for moderate pain.     cetirizine (ZYRTEC) 10 MG tablet Take 10 mg by mouth in the morning.     diltiazem (CARDIZEM CD) 120 MG 24 hr capsule TAKE 1 CAPSULE BY MOUTH EVERY DAY 90 capsule 2   furosemide (LASIX) 40 MG tablet TAKE 1 TABLET BY MOUTH EVERY DAY 90 tablet 3   furosemide (LASIX) 8 MG/ML solution Take 40 mg by mouth daily.     ipratropium-albuterol  (DUONEB) 0.5-2.5 (3) MG/3ML SOLN Take 3 mLs by nebulization as needed.     KLOR-CON M20 20 MEQ tablet Take 1 tablet by mouth daily.     lactulose (CHRONULAC) 10 GM/15ML solution Take 30 g by mouth 3 (three) times daily.     pantoprazole (PROTONIX) 40 MG tablet Take 40 mg by mouth daily.     spironolactone (ALDACTONE) 100 MG tablet Take 1 tablet by mouth daily.     docusate sodium (COLACE) 100 MG capsule Take 100 mg by mouth 2 (two) times daily. (Patient not taking: Reported on 05/25/2023)     No current facility-administered medications for this visit.    Allergies as of 05/25/2023   (No Known Allergies)    Family History  Problem Relation Age of Onset   Hypertension Mother    Esophageal cancer Father    Colon polyps Neg Hx    Colon cancer Neg Hx    Rectal cancer Neg Hx    Stomach cancer Neg Hx     Social History   Socioeconomic History   Marital status: Married    Spouse name: Not on file   Number of children: 3   Years of education: Nursing degree   Highest education level: Not on file  Occupational History   Occupation: retired Charity fundraiser  Tobacco Use   Smoking status: Never   Smokeless tobacco: Never  Substance and Sexual Activity   Alcohol use: Not Currently    Alcohol/week: 1.0 standard drink of alcohol    Types: 1 Glasses of wine per week   Drug use: No   Sexual activity: Not on file  Other Topics Concern   Not on file  Social History Narrative   Married, retired Engineer, 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   3 sons, 09/07/171 son died in his sleep unclear cause   Right handed    Some caffeine    Social Drivers of Corporate investment banker Strain: Not on file  Food Insecurity: Not on file  Transportation Needs: Not on  file  Physical Activity: Not on file  Stress: Not on file  Social Connections: Not on file  Intimate Partner Violence: Not on file    Review of Systems:    Constitutional: No weight loss, fever or  chills Cardiovascular: No chest pain   Respiratory: No SOB or cough Gastrointestinal: See HPI and otherwise negative   Physical Exam:  Vital signs: BP 120/60   Pulse 85   (in wheelchair)  Constitutional:   Pleasant ill appearing Caucasian female appears to be in NAD, Well developed, Well nourished, alert and cooperative Respiratory: Respirations even and unlabored. Lungs clear to auscultation bilaterally.   No wheezes, crackles, or rhonchi.  Cardiovascular: Normal S1, S2. No MRG. Regular rate and rhythm. + Bilateral pitting edema to the level of the mid shin Gastrointestinal:  Soft, moderate distention, nontender. No rebound or guarding. Normal bowel sounds. No appreciable masses or hepatomegaly. Rectal:  Not performed.  Psychiatric: Demonstrates good judgement and reason without abnormal affect or behaviors.  RELEVANT LABS AND IMAGING: CBC    Component Value Date/Time   WBC 20.8 Repeated and verified X2. (HH) 01/12/2023 1613   RBC 3.98 01/12/2023 1613   HGB 11.0 (L) 01/12/2023 1613   HGB 12.0 10/15/2022 1613   HCT 33.6 (L) 01/12/2023 1613   HCT 35.7 10/15/2022 1613   PLT 255.0 01/12/2023 1613   PLT 209 10/15/2022 1613   MCV 84.4 01/12/2023 1613   MCV 86 10/15/2022 1613   MCH 28.8 10/15/2022 1613   MCH 31.2 01/25/2022 0224   MCHC 32.6 01/12/2023 1613   RDW 21.8 (H) 01/12/2023 1613   RDW 17.8 (H) 10/15/2022 1613   LYMPHSABS 0.8 01/12/2023 1613   LYMPHSABS 0.9 09/02/2020 1521   MONOABS 0.7 01/12/2023 1613   EOSABS 0.4 01/12/2023 1613   EOSABS 0.2 09/02/2020 1521   BASOSABS 0.1 01/12/2023 1613   BASOSABS 0.0 09/02/2020 1521    CMP     Component Value Date/Time   NA 133 (L) 01/12/2023 1613   NA 138 10/15/2022 1613   K 3.7 01/12/2023 1613   CL 99 01/12/2023 1613   CO2 29 01/12/2023 1613   GLUCOSE 96 01/12/2023 1613   BUN 15 01/12/2023 1613   BUN 16 10/15/2022 1613   CREATININE 0.84 01/12/2023 1613   CALCIUM 8.3 (L) 01/12/2023 1613   PROT 7.3 01/12/2023 1613    PROT 7.6 10/15/2022 1613   ALBUMIN 2.5 (L) 01/12/2023 1613   ALBUMIN 3.8 10/15/2022 1613   AST 29 01/12/2023 1613   ALT 10 01/12/2023 1613   ALKPHOS 85 01/12/2023 1613   BILITOT 1.2 01/12/2023 1613   BILITOT 1.1 10/15/2022 1613   GFRNONAA >60 01/25/2022 0224   GFRAA 118 03/05/2020 1629    Assessment: 1.  Decompensated MASLD cirrhosis: With recurrent ascites and sounds like SBP with at least 2 hospitalizations this year, currently on Lasix 40 mg daily and Spironolactone 100 mg daily, still in rehab, thought not to be a good candidate for liver transplant given age and comorbidities  Plan: 1.  Discussed case briefly with Dr. Leone Payor.  He recommended a referral to interventional radiology to their TIPS clinic to further discuss this with the patient.  Could also possibly increase diuretics. 2.  Patient likely needs prophylactic Ciprofloxacin given her multiple episodes of SBP, will wait until I get documentation to confirm this before starting. 3.  Continue Lasix 40 mg daily and Spironolactone 100 mg daily for now 4.  Follow in clinic with Dr. Leone Payor in the next 2  to 3 months or sooner if necessary. 5.  Did discuss with the patient that if she feels like she needs a paracentesis prior to her interventional radiology consult we can go ahead and order this for her as an outpatient to be done here in [place].  Savannah Meeker, PA-C [facility] 05/25/2023, 1:27 PM  Cc: Romeo Rabon, MD   GI MD:  Have decided to increase spironolactone to 200 mg every day and furosemide to 80 mg every day BMET in 2 weeks  Continue on w/ TIPS consult also  Iva Boop, MD, [facility]  Addendum: 05/27/2023 3:18 PM  05/01/2023-05/04/2023 patient admitted to the hospital.  At that time noted a chest x-ray showed no acute abnormality, ultrasound right upper quadrant showed cirrhosis and moderate amount of ascites with cholelithiasis.  Patient noted to have severe sepsis secondary to acute  complicated UTI.  That time noted to have end-stage liver cirrhosis due to NASH complicated by recurrent ascites on every 2 weeks paracentesis schedule.  She had been started on Cefepime and Vancomycin due to UTI.  Apparently had previous UTI they required Meropenem.  She was switched to Meropenem.  She also had an ultrasound-guided therapeutic paracentesis on 05/04/2023 with removal of 5.5 L of fluid.  She was given 50 g of IV albumin.  05/01/2023 INR 1.5, PT 15.8.    05/04/2023 CBC with a white count 19.78, hemoglobin 8.4, platelets 241 Received records from patient's recent hospitalizations.  CMP with a creatinine of 1.21, BUN 22, sodium low at 132, T. bili 1.4, AST 42, alk phos 178  05/04/2023 ultrasound-guided paracentesis with removal of 5550 cc of serous fluid.  From recent discharge notes it looks like patient had a UTI rather than SBP.  I do not see that they ever did fluid samples though.  Looks like they Thought source of infection was a UTI.  Will let Dr. Leone Payor review these notes as above, but if no proven SBP in the past then would not start prophylaxis.  Savannah Meeker, PA-C

## 2023-05-25 NOTE — Addendum Note (Signed)
 Addended by: Mariane Duval on: 05/25/2023 02:44 PM   Modules accepted: Orders

## 2023-05-25 NOTE — Patient Instructions (Addendum)
 Your provider has requested that you go to the basement level for lab work before leaving today. Press "B" on the elevator. The lab is located at the first door on the left as you exit the elevator.    Thank you for trusting me with your gastrointestinal care!   Hyacinth Meeker, PA-C   _______________________________________________________  If your blood pressure at your visit was 140/90 or greater, please contact your primary care physician to follow up on this.  _______________________________________________________  If you are age 23 or older, your body mass index should be between 23-30. Your There is no height or weight on file to calculate BMI. If this is out of the aforementioned range listed, please consider follow up with your Primary Care Provider.  If you are age 66 or younger, your body mass index should be between 19-25. Your There is no height or weight on file to calculate BMI. If this is out of the aformentioned range listed, please consider follow up with your Primary Care Provider.   ________________________________________________________  The [facility] providers would like to encourage you to use [facility] to communicate with providers for non-urgent requests or questions.  Due to long hold times on the telephone, sending your provider a message by [facility] may be a faster and more efficient way to get a response.  Please allow 48 business hours for a response.  Please remember that this is for non-urgent requests.  _______________________________________________________

## 2023-05-26 ENCOUNTER — Telehealth: Payer: Self-pay

## 2023-05-26 DIAGNOSIS — K760 Fatty (change of) liver, not elsewhere classified: Secondary | ICD-10-CM

## 2023-05-26 NOTE — Telephone Encounter (Signed)
 Called and spoke with patient regarding recommendations for medication adjustments as outlined below. Pt states that she is at [facility] in [place] at this time. Pt would like prescriptions sent there along with lab order for repeat BMET in 2 weeks. Pt also wanted to follow up and see if you would be prescribing Cipro for her to take, she would need an order faxed over to the facility for that as well. Patient is scheduled for TIPS consult on 06/14/23. Please advise, thanks!

## 2023-05-26 NOTE — Telephone Encounter (Signed)
-----   Message from Unk Lightning sent at 05/26/2023  8:34 AM EST ----- Regarding: FW: Lab ? Can you let patient know I like her to increase her Lasix to 40 mg twice daily and spironolactone to 100 mg twice daily.  She will need a recheck of BMP in 2 weeks.  We will see if this can help little bit with fluid collection as well.  She will likely need a new prescription #60 of Lasix of #60 spironolactone with 5 refills.  Thanks, JL L ----- Message ----- From: Iva Boop, MD Sent: 05/25/2023   8:23 PM EST To: Unk Lightning, PA Subject: Lab ?                                          Are the labs listed after 05/2014 actually 05/2023 labs?  Sounds like BUN/creat are NL so I think we can double furosemide and spironolactone dosage to see if that will help  Would need a BMET in about 2 weeks

## 2023-05-26 NOTE — Telephone Encounter (Signed)
 [facility] [phone]) spoke with nurse Deborha Payment. Sparkle provided me with fax number [phone] to send prescriptions and lab order to.

## 2023-05-27 LAB — AFP TUMOR MARKER: AFP-Tumor Marker: 5.9 ng/mL

## 2023-05-27 MED ORDER — SPIRONOLACTONE 100 MG PO TABS
100.0000 mg | ORAL_TABLET | Freq: Two times a day (BID) | ORAL | 5 refills | Status: AC
Start: 2023-05-27 — End: ?

## 2023-05-27 MED ORDER — FUROSEMIDE 40 MG PO TABS: 40.0000 mg | ORAL_TABLET | Freq: Two times a day (BID) | ORAL | 5 refills | Status: AC

## 2023-05-27 NOTE — Telephone Encounter (Signed)
 Called and informed patient that we will follow up with her in a couple of days about Cipro after Victorino Dike has had a chance to review her hospital records. Otherwise, I informed patient that I faxed over updated Lasix and Spironolactone prescriptions and BMET lab order this morning. Pt will let Nurse Sparkle know. Pt verbalized understanding of all information and had no concerns at the end of the call.

## 2023-05-27 NOTE — Telephone Encounter (Signed)
 Unk Lightning, [place] to Me   I am waiting on the paperwork from her recent hospitalizations in order to decide about the Cipro.  Hopefully hear back in the next few days.  Thanks, JL L

## 2023-06-07 ENCOUNTER — Other Ambulatory Visit

## 2023-06-07 ENCOUNTER — Telehealth: Payer: Self-pay | Admitting: Physician Assistant

## 2023-06-07 NOTE — Telephone Encounter (Signed)
 FYI

## 2023-06-07 NOTE — Telephone Encounter (Signed)
 Inbound call from patient stating that every 2 weeks she has out patient therapy for paratenesis and she is not going to have the  TIPS procedure. Patient wanted message passed along to [facility]. Please advise.

## 2023-06-09 ENCOUNTER — Ambulatory Visit: Payer: Medicare Other | Admitting: Cardiology

## 2023-06-14 ENCOUNTER — Other Ambulatory Visit

## 2023-06-15 LAB — COMPREHENSIVE METABOLIC PANEL WITH GFR: EGFR: 87

## 2023-07-10 ENCOUNTER — Other Ambulatory Visit: Payer: Self-pay | Admitting: Internal Medicine

## 2023-07-12 NOTE — Telephone Encounter (Signed)
 Please advise Sir, thank you.

## 2023-07-13 NOTE — Telephone Encounter (Signed)
 This is not the latest dose (it was 100 mg) and she was supposed to repeat a BMET so no refill right now  Would contact the patient and find out

## 2023-07-13 NOTE — Telephone Encounter (Signed)
 Savannah Hobbs said she will try to get them to send us  a copy, she had a recent CMET. She has been in the hospital and rehab and is now home in hospice. She doesn't need a refill, she said she has plenty.

## 2023-07-30 ENCOUNTER — Other Ambulatory Visit (INDEPENDENT_AMBULATORY_CARE_PROVIDER_SITE_OTHER)

## 2023-07-30 ENCOUNTER — Telehealth: Payer: Self-pay

## 2023-07-30 ENCOUNTER — Encounter: Payer: Self-pay | Admitting: Internal Medicine

## 2023-07-30 ENCOUNTER — Other Ambulatory Visit (HOSPITAL_COMMUNITY): Payer: Self-pay

## 2023-07-30 ENCOUNTER — Ambulatory Visit (INDEPENDENT_AMBULATORY_CARE_PROVIDER_SITE_OTHER): Admitting: Internal Medicine

## 2023-07-30 VITALS — BP 134/62 | HR 87 | Ht 65.0 in | Wt 183.0 lb

## 2023-07-30 DIAGNOSIS — K7682 Hepatic encephalopathy: Secondary | ICD-10-CM

## 2023-07-30 DIAGNOSIS — R188 Other ascites: Secondary | ICD-10-CM | POA: Diagnosis not present

## 2023-07-30 DIAGNOSIS — K7460 Unspecified cirrhosis of liver: Secondary | ICD-10-CM | POA: Diagnosis not present

## 2023-07-30 DIAGNOSIS — K7581 Nonalcoholic steatohepatitis (NASH): Secondary | ICD-10-CM

## 2023-07-30 DIAGNOSIS — K7290 Hepatic failure, unspecified without coma: Secondary | ICD-10-CM | POA: Diagnosis not present

## 2023-07-30 DIAGNOSIS — Z515 Encounter for palliative care: Secondary | ICD-10-CM

## 2023-07-30 LAB — CBC WITH DIFFERENTIAL/PLATELET
Basophils Absolute: 0 10*3/uL (ref 0.0–0.1)
Basophils Relative: 0.4 % (ref 0.0–3.0)
Eosinophils Absolute: 0 10*3/uL (ref 0.0–0.7)
Eosinophils Relative: 0.3 % (ref 0.0–5.0)
HCT: 30.9 % — ABNORMAL LOW (ref 36.0–46.0)
Hemoglobin: 10.3 g/dL — ABNORMAL LOW (ref 12.0–15.0)
Lymphocytes Relative: 5.1 % — ABNORMAL LOW (ref 12.0–46.0)
Lymphs Abs: 0.7 10*3/uL (ref 0.7–4.0)
MCHC: 33.4 g/dL (ref 30.0–36.0)
MCV: 86.4 fl (ref 78.0–100.0)
Monocytes Absolute: 0.4 10*3/uL (ref 0.1–1.0)
Monocytes Relative: 3.1 % (ref 3.0–12.0)
Neutro Abs: 12.4 10*3/uL — ABNORMAL HIGH (ref 1.4–7.7)
Neutrophils Relative %: 91.1 % — ABNORMAL HIGH (ref 43.0–77.0)
Platelets: 255 10*3/uL (ref 150.0–400.0)
RBC: 3.58 Mil/uL — ABNORMAL LOW (ref 3.87–5.11)
RDW: 17.4 % — ABNORMAL HIGH (ref 11.5–15.5)
WBC: 13.6 10*3/uL — ABNORMAL HIGH (ref 4.0–10.5)

## 2023-07-30 LAB — PROTIME-INR
INR: 1.7 ratio — ABNORMAL HIGH (ref 0.8–1.0)
Prothrombin Time: 17.2 s — ABNORMAL HIGH (ref 9.6–13.1)

## 2023-07-30 LAB — COMPREHENSIVE METABOLIC PANEL WITH GFR
ALT: 15 U/L (ref 0–35)
AST: 36 U/L (ref 0–37)
Albumin: 2.7 g/dL — ABNORMAL LOW (ref 3.5–5.2)
Alkaline Phosphatase: 149 U/L — ABNORMAL HIGH (ref 39–117)
BUN: 19 mg/dL (ref 6–23)
CO2: 25 meq/L (ref 19–32)
Calcium: 8.4 mg/dL (ref 8.4–10.5)
Chloride: 100 meq/L (ref 96–112)
Creatinine, Ser: 0.76 mg/dL (ref 0.40–1.20)
GFR: 76.63 mL/min (ref >60.00)
Glucose, Bld: 104 mg/dL — ABNORMAL HIGH (ref 70–99)
Potassium: 4 meq/L (ref 3.5–5.1)
Sodium: 132 meq/L — ABNORMAL LOW (ref 135–145)
Total Bilirubin: 1.9 mg/dL — ABNORMAL HIGH (ref 0.2–1.2)
Total Protein: 7.9 g/dL (ref 6.0–8.3)

## 2023-07-30 MED ORDER — RIFAXIMIN 550 MG PO TABS: 550.0000 mg | ORAL_TABLET | Freq: Two times a day (BID) | ORAL | 11 refills | Status: AC

## 2023-07-30 NOTE — Progress Notes (Signed)
 Savannah Hobbs 77 y.o. [birth year] [id number]  Assessment & Plan:   Encounter Diagnoses  Name Primary?   Refractory ascites Yes   Decompensated cirrhosis (HCC)    Metabolic dysfunction-associated steatohepatitis (MASH)    Encephalopathy, hepatic (HCC)    Hospice care patient    Orders Placed This Encounter  Procedures   CBC with Differential/Platelet   Comprehensive metabolic panel with GFR   Protime-INR    Meds ordered this encounter  Medications   rifaximin (XIFAXAN) 550 MG TABS tablet    Sig: Take 1 tablet (550 mg total) by mouth 2 (two) times daily.    Dispense:  60 tablet    Refill:  11   Continue q 2 week paracentesis for her ascites and fluid overload.  She carries a label of refractory ascites though she is certainly not maxed out on diuretics. Await lab assessment consider increasing diuretics. Symptomatic management otherwise through hospice Follow-up with me to be determined pending labs and clinical course.  Note that we had previously discussed the possibility of TIPS and she did not want to pursue and now that she is in hospice it is not an option.  I have recommended a bedtime high protein snack.  I have prescribed Xifaxan to see if she could afford that and take that to treat her encephalopathy.  Cc Summer Olinger  Subjective:   Chief Complaint: Cirrhosis, hepatic encephalopathy and ascites/edema  HPI 78 year old woman with MASH decompensated cirrhosis and difficult to control/refractory ascites who is now enrolled in hospice and presents for follow-up.  She was last seen in this office by Reginal Capra, PA-C May 25, 2023.  Other medical problems include A-flutter, GERD, myelodysplasia, portal hypertensive gastropathy plus colopathy, A-fib and history of esophageal varices.  She has had multiple hospitalizations in [place] starting in the fall 2024, encephalopathy respiratory failure sepsis UTIs,  SBP.  She has been evaluated by the [facility] in [place] and she was not a candidate for transplant.  She was continued on furosemide  40 mg daily and spironolactone  100 mg daily and she continues on that now.  Subsequent to that she had more hospitalizations and essentially she is ended up on an every other week paracentesis of about 5 L.  She had inquired about a Pleurx catheter which I recommended against.  She returns with her husband today she is actually improving she had a long SNF stay, she is able to ambulate on her own more with the assistance of a cane and get to the toilet by herself her husband says.  She is on lactulose but it nauseates her and sometimes she will even regurgitate.  Mentation has been clear per the husband and the patient.  She is trying to take lactulose twice a day but often only gets it in once a day.  She is not constipated.  She denies abdominal pain though she has abdominal distention and as the ascites accumulates she has increasing early satiety.    No Known Allergies Current Meds  Medication Sig   acetaminophen  (TYLENOL ) 500 MG tablet Take 500 mg by mouth every 6 (six) hours as needed for moderate pain.   cetirizine (ZYRTEC) 10 MG tablet Take 10 mg by mouth in the morning.   diltiazem  (CARDIZEM  CD) 120 MG 24 hr capsule TAKE 1 CAPSULE BY MOUTH EVERY DAY   docusate sodium  (COLACE) 100 MG capsule Take 100 mg by mouth 2 (two) times daily.   furosemide  (LASIX ) 40 MG tablet Take 1 tablet (  40 mg total) by mouth 2 (two) times daily.   ipratropium-albuterol (DUONEB) 0.5-2.5 (3) MG/3ML SOLN Take 3 mLs by nebulization as needed.   lactulose (CHRONULAC) 10 GM/15ML solution Take 30 g by mouth daily as needed.   LORazepam (ATIVAN) 0.5 MG tablet Take 0.5 mg by mouth every 4 (four) hours as needed.   ondansetron  (ZOFRAN ) 4 MG tablet Take 4 mg by mouth every 6 (six) hours as needed.   pantoprazole  (PROTONIX ) 40 MG tablet Take 40 mg by mouth daily.       spironolactone  (ALDACTONE ) 100 MG tablet Take 1 tablet (100  mg total) by mouth 2 (two) times daily.   Past Medical History:  Diagnosis Date   Abdominal wall cellulitis 01/18/2021   Allergy    Arthritis    Asthma    Cirrhosis of liver (HCC)    NASH   Erosive esophagitis 1995   Fatty liver    GERD (gastroesophageal reflux disease)    Hiatal hernia    LARGE   Hx of colonic polyps 05/27/2018   Hx of transfusion of platelets    Hyperlipidemia    Hypertension    Myelodysplasia-Myeloproliferative disease (HCC)    Paroxysmal atrial fibrillation (HCC)    Portal hypertensive gastropathy and coloapthy (HCC) 11/27/2021   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HCC) 11/27/2021   Thrombocytopenia (HCC)    Past Surgical History:  Procedure Laterality Date   ABDOMINAL HYSTERECTOMY     partial   APPENDECTOMY     ATRIAL FIBRILLATION ABLATION N/A 09/26/2020   Procedure: ATRIAL FIBRILLATION ABLATION;  Surgeon: Jolly Needle, MD;  Location: [facility];  Service: Cardiovascular;  Laterality: N/A;   COLONOSCOPY     ESOPHAGOGASTRODUODENOSCOPY     GYNECOLOGIC CRYOSURGERY     x2   IR US  GUIDE BX ASP/DRAIN  01/29/2021   REPLACEMENT TOTAL KNEE Left 2016   TONSILLECTOMY     TOTAL KNEE ARTHROPLASTY Right    TUBAL LIGATION     UPPER GASTROINTESTINAL ENDOSCOPY     Social History   Social History Narrative   Married, retired Engineer, 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   3 sons, 2017-08-261 son died in his sleep unclear cause   Right handed    Some caffeine    family history includes Esophageal cancer in her father; Hypertension in her mother.   Review of Systems As per HPI  Objective:   Physical Exam @BP  134/62   Pulse 87   Ht 5\' 5"  (1.651 m)   Wt 183 lb (83 kg)   BMI 30.45 kg/m @  General:  Jaundiced and chronically ill, able to rise and move with cane or assist Eyes:  icteric. Lungs: Clear to auscultation bilaterally. Heart:   S1S2, 3/6 SEM best RUSB Abdomen:  Obese, + ascites w/ bulging flanks  soft, non-tender, small softepigastric and umbilical hernias, BS+.  Lymph:  no cervical or supraclavicular adenopathy. Extremities:   Brawny 3+ LE edema  Neuro:  A&O x 3. No asterixis Psych:  appropriate mood and  Affect.   Data Reviewed: As per HPI

## 2023-07-30 NOTE — Patient Instructions (Signed)
 We have sent the following medications to your pharmacy for you to pick up at your convenience: Xifaxan  Your provider has requested that you go to the basement level for lab work before leaving today. Press "B" on the elevator. The lab is located at the first door on the left as you exit the elevator.  Due to recent changes in healthcare laws, you may see the results of your imaging and laboratory studies on MyChart before your provider has had a chance to review them.  We understand that in some cases there may be results that are confusing or concerning to you. Not all laboratory results come back in the same time frame and the provider may be waiting for multiple results in order to interpret others.  Please give us  48 hours in order for your provider to thoroughly review all the results before contacting the office for clarification of your results.   I appreciate the opportunity to care for you. Loy Ruff, MD, [facility]

## 2023-07-30 NOTE — Telephone Encounter (Signed)
 Pharmacy Patient Advocate Encounter   Received notification from CoverMyMeds that prior authorization for Xifaxan 550MG  tablets is required/requested.   Insurance verification completed.   The patient is insured through [facility] .   Per test claim: PA required; PA submitted to above mentioned insurance via CoverMyMeds Key/confirmation #/EOC [id number] Status is pending

## 2023-08-02 NOTE — Telephone Encounter (Signed)
 Left message on machine to call back

## 2023-08-02 NOTE — Telephone Encounter (Signed)
 Dr Willy Harvest the [facility] has been denied how would you like to proceed?

## 2023-08-02 NOTE — Telephone Encounter (Signed)
 We expected that was likely Will plan to continue with lactulose only Please let her know

## 2023-08-02 NOTE — Telephone Encounter (Signed)
 Pharmacy Patient Advocate Encounter  Received notification from [facility] [employer] that Prior Authorization for Xifaxan 550MG  tablets has been DENIED.  Full denial letter will be uploaded to the media tab. See denial reason below.  The following criteria were not met:  The patient has not have previously overt hepatic encephalopathy.  PA #/Case ID/Reference #: [id number]

## 2023-08-03 NOTE — Telephone Encounter (Signed)
 The pt has been advised of the denial and agrees to continue Lactulose as prescribed.  Labs have been sent to the PCP as requested

## 2023-08-05 ENCOUNTER — Telehealth: Payer: Self-pay

## 2023-08-05 NOTE — Telephone Encounter (Signed)
 Savannah Hobbs called in today. She thought we had called her. We figured out it was an old message. While on the phone she said she has had some nausea and vomited twice in the past 2 days. She said she is due for her paracentesis soon and she wonders if that is the cause. She denies any fever. She said she will call us  back if this continues or worsens.  I will route to Dr Willy Harvest to make him aware.

## 2023-09-13 ENCOUNTER — Encounter (HOSPITAL_COMMUNITY): Payer: Self-pay

## 2023-09-21 DEATH — deceased
